# Patient Record
Sex: Female | Born: 1940
Health system: Southern US, Community
[De-identification: ages and names within clinical notes are randomized; demographics above are authoritative.]

## PROBLEM LIST (undated history)

## (undated) DIAGNOSIS — R7989 Other specified abnormal findings of blood chemistry: Secondary | ICD-10-CM

## (undated) DIAGNOSIS — D259 Leiomyoma of uterus, unspecified: Secondary | ICD-10-CM

## (undated) DIAGNOSIS — G4700 Insomnia, unspecified: Secondary | ICD-10-CM

## (undated) DIAGNOSIS — I8390 Asymptomatic varicose veins of unspecified lower extremity: Secondary | ICD-10-CM

## (undated) DIAGNOSIS — E669 Obesity, unspecified: Secondary | ICD-10-CM

## (undated) DIAGNOSIS — R002 Palpitations: Secondary | ICD-10-CM

## (undated) DIAGNOSIS — F411 Generalized anxiety disorder: Secondary | ICD-10-CM

## (undated) DIAGNOSIS — D72819 Decreased white blood cell count, unspecified: Secondary | ICD-10-CM

## (undated) DIAGNOSIS — M4120 Other idiopathic scoliosis, site unspecified: Secondary | ICD-10-CM

## (undated) DIAGNOSIS — K5730 Diverticulosis of large intestine without perforation or abscess without bleeding: Secondary | ICD-10-CM

## (undated) DIAGNOSIS — G709 Myoneural disorder, unspecified: Secondary | ICD-10-CM

## (undated) DIAGNOSIS — T7840XA Allergy, unspecified, initial encounter: Secondary | ICD-10-CM

## (undated) DIAGNOSIS — M545 Low back pain: Secondary | ICD-10-CM

## (undated) DIAGNOSIS — E785 Hyperlipidemia, unspecified: Secondary | ICD-10-CM

## (undated) DIAGNOSIS — M5030 Other cervical disc degeneration, unspecified cervical region: Secondary | ICD-10-CM

## (undated) DIAGNOSIS — K4090 Unilateral inguinal hernia, without obstruction or gangrene, not specified as recurrent: Secondary | ICD-10-CM

## (undated) DIAGNOSIS — H93099 Unspecified degenerative and vascular disorders of unspecified ear: Secondary | ICD-10-CM

## (undated) DIAGNOSIS — R269 Unspecified abnormalities of gait and mobility: Secondary | ICD-10-CM

## (undated) DIAGNOSIS — K219 Gastro-esophageal reflux disease without esophagitis: Secondary | ICD-10-CM

## (undated) DIAGNOSIS — I440 Atrioventricular block, first degree: Secondary | ICD-10-CM

## (undated) DIAGNOSIS — F329 Major depressive disorder, single episode, unspecified: Secondary | ICD-10-CM

## (undated) DIAGNOSIS — R609 Edema, unspecified: Secondary | ICD-10-CM

## (undated) DIAGNOSIS — K279 Peptic ulcer, site unspecified, unspecified as acute or chronic, without hemorrhage or perforation: Secondary | ICD-10-CM

## (undated) DIAGNOSIS — I872 Venous insufficiency (chronic) (peripheral): Secondary | ICD-10-CM

## (undated) DIAGNOSIS — I10 Essential (primary) hypertension: Secondary | ICD-10-CM

## (undated) DIAGNOSIS — M1990 Unspecified osteoarthritis, unspecified site: Secondary | ICD-10-CM

## (undated) DIAGNOSIS — R011 Cardiac murmur, unspecified: Secondary | ICD-10-CM

## (undated) DIAGNOSIS — H269 Unspecified cataract: Secondary | ICD-10-CM

## (undated) DIAGNOSIS — N951 Menopausal and female climacteric states: Secondary | ICD-10-CM

## (undated) HISTORY — DX: Unspecified degenerative and vascular disorders of unspecified ear: H93.099

## (undated) HISTORY — DX: Unspecified abnormalities of gait and mobility: R26.9

## (undated) HISTORY — DX: Hyperlipidemia, unspecified: E78.5

## (undated) HISTORY — DX: Insomnia, unspecified: G47.00

## (undated) HISTORY — DX: Major depressive disorder, single episode, unspecified: F32.9

## (undated) HISTORY — DX: Palpitations: R00.2

## (undated) HISTORY — DX: Unspecified cataract: H26.9

## (undated) HISTORY — DX: Decreased white blood cell count, unspecified: D72.819

## (undated) HISTORY — DX: Atrioventricular block, first degree: I44.0

## (undated) HISTORY — DX: Generalized anxiety disorder: F41.1

## (undated) HISTORY — DX: Leiomyoma of uterus, unspecified: D25.9

## (undated) HISTORY — DX: Obesity, unspecified: E66.9

## (undated) HISTORY — DX: Essential (primary) hypertension: I10

## (undated) HISTORY — DX: Peptic ulcer, site unspecified, unspecified as acute or chronic, without hemorrhage or perforation: K27.9

## (undated) HISTORY — DX: Gastro-esophageal reflux disease without esophagitis: K21.9

## (undated) HISTORY — DX: Menopausal and female climacteric states: N95.1

## (undated) HISTORY — DX: Myoneural disorder, unspecified: G70.9

## (undated) HISTORY — DX: Other specified abnormal findings of blood chemistry: R79.89

## (undated) HISTORY — DX: Other idiopathic scoliosis, site unspecified: M41.20

## (undated) HISTORY — DX: Diverticulosis of large intestine without perforation or abscess without bleeding: K57.30

## (undated) HISTORY — DX: Unilateral inguinal hernia, without obstruction or gangrene, not specified as recurrent: K40.90

## (undated) HISTORY — DX: Edema, unspecified: R60.9

## (undated) HISTORY — DX: Cardiac murmur, unspecified: R01.1

## (undated) HISTORY — DX: Low back pain: M54.5

## (undated) HISTORY — DX: Asymptomatic varicose veins of unspecified lower extremity: I83.90

## (undated) HISTORY — DX: Other cervical disc degeneration, unspecified cervical region: M50.30

## (undated) HISTORY — DX: Venous insufficiency (chronic) (peripheral): I87.2

## (undated) HISTORY — DX: Allergy, unspecified, initial encounter: T78.40XA

## (undated) HISTORY — DX: Unspecified osteoarthritis, unspecified site: M19.90

## (undated) HISTORY — PX: TUBAL LIGATION: SHX77

---

## 1969-02-25 HISTORY — PX: CHOLECYSTECTOMY: SHX55

## 1972-02-26 HISTORY — PX: OOPHORECTOMY: SHX86

## 1973-02-25 HISTORY — PX: VESICOVAGINAL FISTULA CLOSURE W/ TAH: SUR271

## 1973-02-25 HISTORY — PX: TONSILLECTOMY AND ADENOIDECTOMY: SHX28

## 1989-08-25 DIAGNOSIS — K5730 Diverticulosis of large intestine without perforation or abscess without bleeding: Secondary | ICD-10-CM

## 1989-08-25 HISTORY — DX: Diverticulosis of large intestine without perforation or abscess without bleeding: K57.30

## 1990-05-28 DIAGNOSIS — N951 Menopausal and female climacteric states: Secondary | ICD-10-CM

## 1990-05-28 HISTORY — DX: Menopausal and female climacteric states: N95.1

## 1992-03-28 HISTORY — PX: ENDOMETRIAL BIOPSY: SHX622

## 1993-02-25 HISTORY — PX: THUMB ARTHROSCOPY: SHX2509

## 1996-05-26 DIAGNOSIS — E669 Obesity, unspecified: Secondary | ICD-10-CM

## 1996-05-26 HISTORY — DX: Obesity, unspecified: E66.9

## 1997-07-26 HISTORY — PX: KNEE ARTHROSCOPY: SHX127

## 1997-09-12 ENCOUNTER — Ambulatory Visit (HOSPITAL_COMMUNITY): Admission: RE | Admit: 1997-09-12 | Discharge: 1997-09-12 | Payer: Self-pay | Admitting: Obstetrics and Gynecology

## 1998-02-27 ENCOUNTER — Other Ambulatory Visit: Admission: RE | Admit: 1998-02-27 | Discharge: 1998-02-27 | Payer: Self-pay | Admitting: Obstetrics and Gynecology

## 1998-04-20 ENCOUNTER — Encounter: Payer: Self-pay | Admitting: Obstetrics and Gynecology

## 1998-04-20 ENCOUNTER — Ambulatory Visit (HOSPITAL_COMMUNITY): Admission: RE | Admit: 1998-04-20 | Discharge: 1998-04-20 | Payer: Self-pay | Admitting: Obstetrics and Gynecology

## 1999-04-18 ENCOUNTER — Other Ambulatory Visit: Admission: RE | Admit: 1999-04-18 | Discharge: 1999-04-18 | Payer: Self-pay | Admitting: Obstetrics and Gynecology

## 1999-04-25 ENCOUNTER — Encounter: Payer: Self-pay | Admitting: Internal Medicine

## 1999-04-25 ENCOUNTER — Ambulatory Visit (HOSPITAL_COMMUNITY): Admission: RE | Admit: 1999-04-25 | Discharge: 1999-04-25 | Payer: Self-pay | Admitting: Internal Medicine

## 2000-05-30 ENCOUNTER — Encounter: Payer: Self-pay | Admitting: Obstetrics and Gynecology

## 2000-05-30 ENCOUNTER — Ambulatory Visit (HOSPITAL_COMMUNITY): Admission: RE | Admit: 2000-05-30 | Discharge: 2000-05-30 | Payer: Self-pay | Admitting: Obstetrics and Gynecology

## 2000-06-13 ENCOUNTER — Other Ambulatory Visit: Admission: RE | Admit: 2000-06-13 | Discharge: 2000-06-13 | Payer: Self-pay | Admitting: Obstetrics and Gynecology

## 2001-06-09 ENCOUNTER — Ambulatory Visit (HOSPITAL_COMMUNITY): Admission: RE | Admit: 2001-06-09 | Discharge: 2001-06-09 | Payer: Self-pay | Admitting: Obstetrics and Gynecology

## 2001-06-09 ENCOUNTER — Encounter: Payer: Self-pay | Admitting: Obstetrics and Gynecology

## 2001-06-16 ENCOUNTER — Other Ambulatory Visit: Admission: RE | Admit: 2001-06-16 | Discharge: 2001-06-16 | Payer: Self-pay | Admitting: Obstetrics and Gynecology

## 2002-08-23 ENCOUNTER — Ambulatory Visit (HOSPITAL_COMMUNITY): Admission: RE | Admit: 2002-08-23 | Discharge: 2002-08-23 | Payer: Self-pay | Admitting: Internal Medicine

## 2002-08-23 ENCOUNTER — Encounter: Payer: Self-pay | Admitting: Internal Medicine

## 2002-08-25 DIAGNOSIS — H93099 Unspecified degenerative and vascular disorders of unspecified ear: Secondary | ICD-10-CM

## 2002-08-25 HISTORY — DX: Unspecified degenerative and vascular disorders of unspecified ear: H93.099

## 2002-11-10 DIAGNOSIS — F329 Major depressive disorder, single episode, unspecified: Secondary | ICD-10-CM

## 2002-11-10 DIAGNOSIS — K219 Gastro-esophageal reflux disease without esophagitis: Secondary | ICD-10-CM

## 2002-11-10 DIAGNOSIS — F3289 Other specified depressive episodes: Secondary | ICD-10-CM

## 2002-11-10 HISTORY — DX: Other specified depressive episodes: F32.89

## 2002-11-10 HISTORY — DX: Gastro-esophageal reflux disease without esophagitis: K21.9

## 2002-11-10 HISTORY — DX: Major depressive disorder, single episode, unspecified: F32.9

## 2003-04-26 HISTORY — PX: FOOT SURGERY: SHX648

## 2003-10-12 ENCOUNTER — Ambulatory Visit (HOSPITAL_COMMUNITY): Admission: RE | Admit: 2003-10-12 | Discharge: 2003-10-12 | Payer: Self-pay | Admitting: Internal Medicine

## 2004-03-02 ENCOUNTER — Ambulatory Visit (HOSPITAL_BASED_OUTPATIENT_CLINIC_OR_DEPARTMENT_OTHER): Admission: RE | Admit: 2004-03-02 | Discharge: 2004-03-02 | Payer: Self-pay | Admitting: Orthopedic Surgery

## 2004-03-02 ENCOUNTER — Ambulatory Visit (HOSPITAL_COMMUNITY): Admission: RE | Admit: 2004-03-02 | Discharge: 2004-03-02 | Payer: Self-pay | Admitting: Orthopedic Surgery

## 2004-03-02 HISTORY — PX: KNEE SURGERY: SHX244

## 2004-05-26 DIAGNOSIS — M1990 Unspecified osteoarthritis, unspecified site: Secondary | ICD-10-CM

## 2004-05-26 HISTORY — DX: Unspecified osteoarthritis, unspecified site: M19.90

## 2004-10-24 DIAGNOSIS — E785 Hyperlipidemia, unspecified: Secondary | ICD-10-CM

## 2004-10-24 HISTORY — DX: Hyperlipidemia, unspecified: E78.5

## 2004-11-07 ENCOUNTER — Ambulatory Visit (HOSPITAL_COMMUNITY): Admission: RE | Admit: 2004-11-07 | Discharge: 2004-11-07 | Payer: Self-pay | Admitting: Internal Medicine

## 2004-11-13 DIAGNOSIS — I10 Essential (primary) hypertension: Secondary | ICD-10-CM

## 2004-11-13 HISTORY — DX: Essential (primary) hypertension: I10

## 2005-07-11 DIAGNOSIS — R269 Unspecified abnormalities of gait and mobility: Secondary | ICD-10-CM

## 2005-07-11 HISTORY — DX: Unspecified abnormalities of gait and mobility: R26.9

## 2005-11-21 ENCOUNTER — Ambulatory Visit (HOSPITAL_COMMUNITY): Admission: RE | Admit: 2005-11-21 | Discharge: 2005-11-21 | Payer: Self-pay | Admitting: Internal Medicine

## 2006-05-27 DIAGNOSIS — D259 Leiomyoma of uterus, unspecified: Secondary | ICD-10-CM

## 2006-05-27 DIAGNOSIS — K279 Peptic ulcer, site unspecified, unspecified as acute or chronic, without hemorrhage or perforation: Secondary | ICD-10-CM

## 2006-05-27 HISTORY — DX: Peptic ulcer, site unspecified, unspecified as acute or chronic, without hemorrhage or perforation: K27.9

## 2006-05-27 HISTORY — DX: Leiomyoma of uterus, unspecified: D25.9

## 2006-11-25 ENCOUNTER — Ambulatory Visit (HOSPITAL_COMMUNITY): Admission: RE | Admit: 2006-11-25 | Discharge: 2006-11-25 | Payer: Self-pay | Admitting: Internal Medicine

## 2006-11-28 DIAGNOSIS — K4090 Unilateral inguinal hernia, without obstruction or gangrene, not specified as recurrent: Secondary | ICD-10-CM

## 2006-11-28 HISTORY — DX: Unilateral inguinal hernia, without obstruction or gangrene, not specified as recurrent: K40.90

## 2007-03-29 HISTORY — PX: INGUINAL HERNIA REPAIR: SHX194

## 2007-04-03 ENCOUNTER — Ambulatory Visit (HOSPITAL_COMMUNITY): Admission: RE | Admit: 2007-04-03 | Discharge: 2007-04-03 | Payer: Self-pay | Admitting: Surgery

## 2007-07-22 DIAGNOSIS — M545 Low back pain, unspecified: Secondary | ICD-10-CM

## 2007-07-22 HISTORY — DX: Low back pain, unspecified: M54.50

## 2007-08-29 DIAGNOSIS — R002 Palpitations: Secondary | ICD-10-CM

## 2007-08-29 HISTORY — DX: Palpitations: R00.2

## 2007-09-28 ENCOUNTER — Encounter: Admission: RE | Admit: 2007-09-28 | Discharge: 2007-09-28 | Payer: Self-pay | Admitting: Family Medicine

## 2007-11-26 ENCOUNTER — Ambulatory Visit (HOSPITAL_COMMUNITY): Admission: RE | Admit: 2007-11-26 | Discharge: 2007-11-26 | Payer: Self-pay | Admitting: Internal Medicine

## 2007-12-15 DIAGNOSIS — G4700 Insomnia, unspecified: Secondary | ICD-10-CM | POA: Insufficient documentation

## 2007-12-15 HISTORY — DX: Insomnia, unspecified: G47.00

## 2008-07-19 DIAGNOSIS — R7989 Other specified abnormal findings of blood chemistry: Secondary | ICD-10-CM

## 2008-07-19 DIAGNOSIS — I440 Atrioventricular block, first degree: Secondary | ICD-10-CM

## 2008-07-19 HISTORY — DX: Other specified abnormal findings of blood chemistry: R79.89

## 2008-07-19 HISTORY — DX: Atrioventricular block, first degree: I44.0

## 2008-11-28 ENCOUNTER — Ambulatory Visit (HOSPITAL_COMMUNITY): Admission: RE | Admit: 2008-11-28 | Discharge: 2008-11-28 | Payer: Self-pay | Admitting: Internal Medicine

## 2009-05-23 DIAGNOSIS — M4120 Other idiopathic scoliosis, site unspecified: Secondary | ICD-10-CM

## 2009-05-23 HISTORY — DX: Other idiopathic scoliosis, site unspecified: M41.20

## 2009-11-30 ENCOUNTER — Ambulatory Visit (HOSPITAL_COMMUNITY): Admission: RE | Admit: 2009-11-30 | Discharge: 2009-11-30 | Payer: Self-pay | Admitting: Internal Medicine

## 2010-03-19 ENCOUNTER — Encounter: Payer: Self-pay | Admitting: Family Medicine

## 2010-07-10 NOTE — Op Note (Signed)
Felicia Sawyer, DEERMAN.:  [id number]   MEDICAL RECORD NO.:  [id number]          PATIENT TYPE:  AMB   LOCATION:  DAY                          FACILITY:  [facility]   PHYSICIAN:  Felicia Sawyer, M.D. DATE OF BIRTH:  07-16-1940   DATE OF PROCEDURE:  04/03/2007  DATE OF DISCHARGE:                               OPERATIVE REPORT   PREOPERATIVE DIAGNOSIS:  Right inguinal hernia.   POSTOPERATIVE DIAGNOSIS:  Right inguinal hernia.   PROCEDURE PERFORMED:  Right inguinal hernia repair with mesh.   SURGEON:  Felicia Arms. Corliss Skains, MD., FACS   ANESTHESIA:  General endotracheal.   INDICATIONS:  The patient is a 70 year old female, who presents with a  six-month history of an uncomfortable bulge in her right groin.  This  reduces when she is supine.  She denies any obstructive symptoms.  On  examination in the office, she was found to have a reducible right  inguinal hernia.  We recommended elective repair.   DESCRIPTION OF PROCEDURE:  The patient was brought to the operating room  and placed in the supine position on the operating room table.  After an  adequate level of general anesthesia was obtained, the patient's right  groin was shaved, prepped with Betadine, and draped in a sterile  fashion.  A timeout was taken to ensure the proper patient and proper  procedure.  She had been given preoperative antibiotics.  The area above  the right inguinal ligament was then infiltrated with quarter-percent  Marcaine with epinephrine.  An oblique incision was made above the  inguinal ligament.  Dissection was carried down through the subcutaneous  tissues to the external oblique fascia.  The external oblique fascia was  divided along the direction of its fibers down to the external ring.  Weitlaner retractors were used to provide exposure.  A large, direct  hernia was identified.  We dissected the hernia sac free from the  surrounding tissue and reduced it up into the hernia defect.   The floor  of the inguinal canal was then reapproximated with a #0 PDS suture.  There was no sign of an indirect hernia.  The floor of the inguinal  canal was then reinforced with an oval-shaped piece of ULTRAPRO mesh.  This was secured with #2-0 Vicryl beginning at the pubic tubercle.  We  ran the suture along the internal oblique fascia superiorly and the  shelving edge inferiorly.  The lateral end was tucked underneath the  external oblique fascia.  The external oblique fascia was then  reapproximated with #2-0 Vicryl.  #3-0 Vicryl was used to close the  subcutaneous tissues, #4-0 Monocryl sutures for the skin.  Steri-Strips  and dressings were applied.  The patient was extubated and brought to  the recovery room in stable condition.  All sponge and instrument and  needle counts were correct.      Felicia Arms. Tsuei, M.D.  Electronically Signed     MKT/MEDQ  D:  04/03/2007  T:  04/04/2007  Job:  [id number]   cc:   Lenon Curt. Chilton Si, M.D.  Fax: [phone]

## 2010-07-13 NOTE — Op Note (Signed)
[name], Felicia Sawyer NO.:  [id number]   MEDICAL RECORD NO.:  [id number]          PATIENT TYPE:  AMB   LOCATION:  DSC                          FACILITY:  [facility]   PHYSICIAN:  Harvie Junior, M.D.   DATE OF BIRTH:  Jun 28, 1940   DATE OF PROCEDURE:  03/02/2004  DATE OF DISCHARGE:                                 OPERATIVE REPORT   PREOPERATIVE DIAGNOSES:  Medial meniscal tear with tricompartmental  degenerative joint disease.   POSTOPERATIVE DIAGNOSES:  Medial meniscal tear with tricompartmental  degenerative joint disease.   PRINCIPAL PROCEDURE:  1.  Partial posterior horn medial meniscectomy with corresponding      debridement of medial femoral compartment.  2.  Debridement of patellofemoral trochlea from the both the medial and      lateral compartment.   SURGEON:  Harvie Junior, M.D.   ASSISTANT:  None.   ANESTHESIA:  Knee block with MAC.   BRIEF HISTORY:  A 70 year old female with a long history of having had  significant right knee pain. She ultimately was evaluated with MRI which  showed that she had a posterior horn medial meniscal tear because of  continuing complaints of intermittent locking, catching pain and failure of  conservative care. She was taken to the operating room for operative knee  arthroscopy.   DESCRIPTION OF PROCEDURE:  The patient was taken to the operating room and  after adequate anesthesia was obtained with general anesthetic, the patient  was placed on the operating table, the right knee was prepped and draped in  the usual sterile fashion. Following this, routine arthroscopic examination  of the knee revealed there was an obvious area of cartilage injury in the  trochlea. This was debrided from both the medial and lateral portal.  Following this, attention turned to the medial compartment where there was  noted to be grade 2 and 3 change on the medial femoral condyle as well as a  posterior horn meniscal tear. This was  debrided to a smooth and stable rim  with straight biting forceps. A probe was used to make sure the remaining  meniscus was stable. Attention turned to the ACL normal, attention turned to  the lateral compartment which was within normal limits.  At this point, the  knee was copiously irrigated with normal saline irrigation and suctioned  drop. The arthroscopic portals were closed with a bandage, a sterile  compressive dressing was applied, the patient taken to the recovery room  where she was noted to be in satisfactory condition. Estimated blood loss  for the procedure was none.      JLG/MEDQ  D:  03/02/2004  T:  03/02/2004  Job:  [id number]

## 2010-08-28 DIAGNOSIS — D72819 Decreased white blood cell count, unspecified: Secondary | ICD-10-CM

## 2010-08-28 HISTORY — DX: Decreased white blood cell count, unspecified: D72.819

## 2010-11-16 LAB — DIFFERENTIAL
Basophils Absolute: 0
Basophils Relative: 1
Eosinophils Absolute: 0.2
Eosinophils Relative: 5
Lymphocytes Relative: 45
Lymphs Abs: 2
Monocytes Absolute: 0.4
Monocytes Relative: 10
Neutro Abs: 1.7
Neutrophils Relative %: 39 — ABNORMAL LOW

## 2010-11-16 LAB — CBC
HCT: 36.9
Hemoglobin: 12.4
MCHC: 33.5
MCV: 92
Platelets: 213
RBC: 4.01
RDW: 12.4
WBC: 4.3

## 2010-11-16 LAB — BASIC METABOLIC PANEL
BUN: 8
CO2: 29
Calcium: 9.1
Chloride: 98
Creatinine, Ser: 0.71
GFR calc Af Amer: >60
GFR calc non Af Amer: >60
Glucose, Bld: 95
Potassium: 3.9
Sodium: 135

## 2010-12-10 ENCOUNTER — Other Ambulatory Visit: Payer: Self-pay | Admitting: Internal Medicine

## 2010-12-10 DIAGNOSIS — Z1231 Encounter for screening mammogram for malignant neoplasm of breast: Secondary | ICD-10-CM

## 2011-01-02 ENCOUNTER — Ambulatory Visit (HOSPITAL_COMMUNITY)
Admission: RE | Admit: 2011-01-02 | Discharge: 2011-01-02 | Disposition: A | Discharge status: Home / Self Care | Payer: Medicare Other | Source: Ambulatory Visit | Attending: Internal Medicine | Admitting: Internal Medicine

## 2011-01-02 DIAGNOSIS — Z1231 Encounter for screening mammogram for malignant neoplasm of breast: Secondary | ICD-10-CM | POA: Insufficient documentation

## 2011-03-04 DIAGNOSIS — R7989 Other specified abnormal findings of blood chemistry: Secondary | ICD-10-CM | POA: Diagnosis not present

## 2011-03-04 DIAGNOSIS — I10 Essential (primary) hypertension: Secondary | ICD-10-CM | POA: Diagnosis not present

## 2011-03-04 DIAGNOSIS — E785 Hyperlipidemia, unspecified: Secondary | ICD-10-CM | POA: Diagnosis not present

## 2011-03-06 ENCOUNTER — Other Ambulatory Visit: Payer: Self-pay | Admitting: Internal Medicine

## 2011-03-06 DIAGNOSIS — R7989 Other specified abnormal findings of blood chemistry: Secondary | ICD-10-CM | POA: Diagnosis not present

## 2011-03-06 DIAGNOSIS — Z23 Encounter for immunization: Secondary | ICD-10-CM | POA: Diagnosis not present

## 2011-03-06 DIAGNOSIS — Z780 Asymptomatic menopausal state: Secondary | ICD-10-CM

## 2011-03-06 DIAGNOSIS — I10 Essential (primary) hypertension: Secondary | ICD-10-CM | POA: Diagnosis not present

## 2011-03-06 DIAGNOSIS — E785 Hyperlipidemia, unspecified: Secondary | ICD-10-CM | POA: Diagnosis not present

## 2011-03-12 ENCOUNTER — Ambulatory Visit (HOSPITAL_COMMUNITY): Payer: Medicare Other | Attending: Internal Medicine

## 2011-03-12 DIAGNOSIS — M5030 Other cervical disc degeneration, unspecified cervical region: Secondary | ICD-10-CM | POA: Diagnosis not present

## 2011-03-12 DIAGNOSIS — M9981 Other biomechanical lesions of cervical region: Secondary | ICD-10-CM | POA: Diagnosis not present

## 2011-03-12 DIAGNOSIS — M999 Biomechanical lesion, unspecified: Secondary | ICD-10-CM | POA: Diagnosis not present

## 2011-03-12 DIAGNOSIS — M5137 Other intervertebral disc degeneration, lumbosacral region: Secondary | ICD-10-CM | POA: Diagnosis not present

## 2011-06-12 DIAGNOSIS — H359 Unspecified retinal disorder: Secondary | ICD-10-CM | POA: Diagnosis not present

## 2011-06-12 DIAGNOSIS — H2510 Age-related nuclear cataract, unspecified eye: Secondary | ICD-10-CM | POA: Diagnosis not present

## 2011-06-12 DIAGNOSIS — H524 Presbyopia: Secondary | ICD-10-CM | POA: Diagnosis not present

## 2011-09-20 DIAGNOSIS — D72819 Decreased white blood cell count, unspecified: Secondary | ICD-10-CM | POA: Diagnosis not present

## 2011-09-20 DIAGNOSIS — I10 Essential (primary) hypertension: Secondary | ICD-10-CM | POA: Diagnosis not present

## 2011-09-20 DIAGNOSIS — E785 Hyperlipidemia, unspecified: Secondary | ICD-10-CM | POA: Diagnosis not present

## 2011-09-24 DIAGNOSIS — E785 Hyperlipidemia, unspecified: Secondary | ICD-10-CM | POA: Diagnosis not present

## 2011-09-24 DIAGNOSIS — I10 Essential (primary) hypertension: Secondary | ICD-10-CM | POA: Diagnosis not present

## 2011-09-24 DIAGNOSIS — R7989 Other specified abnormal findings of blood chemistry: Secondary | ICD-10-CM | POA: Diagnosis not present

## 2011-09-24 DIAGNOSIS — R609 Edema, unspecified: Secondary | ICD-10-CM

## 2011-09-24 DIAGNOSIS — I8390 Asymptomatic varicose veins of unspecified lower extremity: Secondary | ICD-10-CM

## 2011-09-24 HISTORY — DX: Asymptomatic varicose veins of unspecified lower extremity: I83.90

## 2011-09-24 HISTORY — DX: Edema, unspecified: R60.9

## 2011-12-31 DIAGNOSIS — Z23 Encounter for immunization: Secondary | ICD-10-CM | POA: Diagnosis not present

## 2012-01-14 ENCOUNTER — Other Ambulatory Visit: Payer: Self-pay | Admitting: Internal Medicine

## 2012-01-14 DIAGNOSIS — Z1231 Encounter for screening mammogram for malignant neoplasm of breast: Secondary | ICD-10-CM

## 2012-02-05 ENCOUNTER — Ambulatory Visit (HOSPITAL_COMMUNITY)
Admission: RE | Admit: 2012-02-05 | Discharge: 2012-02-05 | Disposition: A | Discharge status: Home / Self Care | Payer: Medicare Other | Source: Ambulatory Visit | Attending: Internal Medicine | Admitting: Internal Medicine

## 2012-02-05 DIAGNOSIS — Z1231 Encounter for screening mammogram for malignant neoplasm of breast: Secondary | ICD-10-CM | POA: Diagnosis not present

## 2012-02-12 ENCOUNTER — Other Ambulatory Visit: Payer: Self-pay | Admitting: Internal Medicine

## 2012-02-12 DIAGNOSIS — R928 Other abnormal and inconclusive findings on diagnostic imaging of breast: Secondary | ICD-10-CM

## 2012-02-14 ENCOUNTER — Ambulatory Visit
Admission: RE | Admit: 2012-02-14 | Discharge: 2012-02-14 | Disposition: A | Discharge status: Home / Self Care | Payer: Medicare Other | Source: Ambulatory Visit | Attending: Internal Medicine | Admitting: Internal Medicine

## 2012-02-14 DIAGNOSIS — N6009 Solitary cyst of unspecified breast: Secondary | ICD-10-CM | POA: Diagnosis not present

## 2012-02-14 DIAGNOSIS — R928 Other abnormal and inconclusive findings on diagnostic imaging of breast: Secondary | ICD-10-CM

## 2012-03-23 DIAGNOSIS — R7989 Other specified abnormal findings of blood chemistry: Secondary | ICD-10-CM | POA: Diagnosis not present

## 2012-03-23 DIAGNOSIS — Z79899 Other long term (current) drug therapy: Secondary | ICD-10-CM | POA: Diagnosis not present

## 2012-03-23 DIAGNOSIS — E785 Hyperlipidemia, unspecified: Secondary | ICD-10-CM | POA: Diagnosis not present

## 2012-03-23 DIAGNOSIS — I10 Essential (primary) hypertension: Secondary | ICD-10-CM | POA: Diagnosis not present

## 2012-04-08 DIAGNOSIS — F411 Generalized anxiety disorder: Secondary | ICD-10-CM | POA: Insufficient documentation

## 2012-04-08 DIAGNOSIS — I10 Essential (primary) hypertension: Secondary | ICD-10-CM | POA: Diagnosis not present

## 2012-04-08 HISTORY — DX: Generalized anxiety disorder: F41.1

## 2012-06-13 ENCOUNTER — Other Ambulatory Visit (HOSPITAL_COMMUNITY): Payer: Self-pay | Admitting: Internal Medicine

## 2012-07-14 ENCOUNTER — Other Ambulatory Visit: Payer: Self-pay | Admitting: *Deleted

## 2012-07-14 MED ORDER — CLORAZEPATE DIPOTASSIUM 7.5 MG PO TABS: ORAL_TABLET | ORAL | Status: DC

## 2012-07-15 ENCOUNTER — Other Ambulatory Visit: Payer: Self-pay | Admitting: *Deleted

## 2012-07-15 ENCOUNTER — Other Ambulatory Visit: Payer: Self-pay | Admitting: Internal Medicine

## 2012-07-15 MED ORDER — CLORAZEPATE DIPOTASSIUM 7.5 MG PO TABS: ORAL_TABLET | ORAL | Status: DC

## 2012-07-16 ENCOUNTER — Other Ambulatory Visit: Payer: Self-pay | Admitting: *Deleted

## 2012-07-16 MED ORDER — CLORAZEPATE DIPOTASSIUM 7.5 MG PO TABS: ORAL_TABLET | ORAL | Status: DC

## 2012-09-02 DIAGNOSIS — IMO0002 Reserved for concepts with insufficient information to code with codable children: Secondary | ICD-10-CM | POA: Diagnosis not present

## 2012-09-02 DIAGNOSIS — M5137 Other intervertebral disc degeneration, lumbosacral region: Secondary | ICD-10-CM | POA: Diagnosis not present

## 2012-09-02 DIAGNOSIS — M5030 Other cervical disc degeneration, unspecified cervical region: Secondary | ICD-10-CM | POA: Diagnosis not present

## 2012-09-02 DIAGNOSIS — M999 Biomechanical lesion, unspecified: Secondary | ICD-10-CM | POA: Diagnosis not present

## 2012-09-02 DIAGNOSIS — M9981 Other biomechanical lesions of cervical region: Secondary | ICD-10-CM | POA: Diagnosis not present

## 2012-09-14 ENCOUNTER — Other Ambulatory Visit: Payer: Medicare Other

## 2012-09-14 DIAGNOSIS — Z0000 Encounter for general adult medical examination without abnormal findings: Secondary | ICD-10-CM | POA: Diagnosis not present

## 2012-09-14 DIAGNOSIS — Z79899 Other long term (current) drug therapy: Secondary | ICD-10-CM | POA: Diagnosis not present

## 2012-09-14 DIAGNOSIS — E782 Mixed hyperlipidemia: Secondary | ICD-10-CM | POA: Diagnosis not present

## 2012-09-14 DIAGNOSIS — E785 Hyperlipidemia, unspecified: Secondary | ICD-10-CM | POA: Diagnosis not present

## 2012-09-14 DIAGNOSIS — I10 Essential (primary) hypertension: Secondary | ICD-10-CM | POA: Diagnosis not present

## 2012-09-16 ENCOUNTER — Encounter: Payer: Self-pay | Admitting: *Deleted

## 2012-09-16 ENCOUNTER — Ambulatory Visit (INDEPENDENT_AMBULATORY_CARE_PROVIDER_SITE_OTHER): Payer: Medicare Other | Admitting: Internal Medicine

## 2012-09-16 ENCOUNTER — Encounter: Payer: Self-pay | Admitting: Internal Medicine

## 2012-09-16 VITALS — BP 150/74 | HR 101 | Temp 97.0°F | Resp 20 | Wt 141.2 lb

## 2012-09-16 DIAGNOSIS — F411 Generalized anxiety disorder: Secondary | ICD-10-CM

## 2012-09-16 DIAGNOSIS — M545 Low back pain, unspecified: Secondary | ICD-10-CM

## 2012-09-16 DIAGNOSIS — I872 Venous insufficiency (chronic) (peripheral): Secondary | ICD-10-CM

## 2012-09-16 DIAGNOSIS — R609 Edema, unspecified: Secondary | ICD-10-CM

## 2012-09-16 DIAGNOSIS — I10 Essential (primary) hypertension: Secondary | ICD-10-CM

## 2012-09-16 DIAGNOSIS — G4700 Insomnia, unspecified: Secondary | ICD-10-CM | POA: Diagnosis not present

## 2012-09-16 DIAGNOSIS — I739 Peripheral vascular disease, unspecified: Secondary | ICD-10-CM | POA: Insufficient documentation

## 2012-09-16 DIAGNOSIS — E785 Hyperlipidemia, unspecified: Secondary | ICD-10-CM

## 2012-09-16 HISTORY — DX: Venous insufficiency (chronic) (peripheral): I87.2

## 2012-09-16 MED ORDER — FUROSEMIDE 40 MG PO TABS: 40.0000 mg | ORAL_TABLET | Freq: Every day | ORAL | Status: DC

## 2012-09-16 NOTE — Patient Instructions (Signed)
Switch HCTZ to furosemide.

## 2012-09-16 NOTE — Progress Notes (Signed)
MRN: [id number] Name: Felicia Sawyer  Sex: female Age: 72 y.o. DOB: August 07, 1940  [facility] #: [id number] Level Of Care: independent Provider: Kimber Relic Emergency Contacts: Extended Emergency Contact Information Primary Emergency Contact: Wade,Janice Address: [address]          [place], [place] Mobile Phone: [phone] Relation: None  Code Status: Full  Allergies: Demerol; Venlafaxine hcl; and Pseudoephedrine  Chief Complaint  Patient presents with  . Annual Exam    HPI: Patient is 73 y.o. female who presents for complete exam and evaluation of her medical problems.  Husband's sister died recently.  Concerned about the edema in the legs. No pain. Veins are looking worse. Using HCTZ 50 mg daily, but it does not help the edema.  Anxiety state, unspecified: Stable.  Edema : Currently on HCTZ for her blood pressure. It does not seem to impact the edema. She is on no other medications that should cause edema. She has known varicosities. No known history of DVT.  Insomnia, unspecified: Doing better with nocturnal rest.  Unspecified essential hypertension: Controlled  Other and unspecified hyperlipidemia: Controlled  Lumbago: Stable and improved at this time.  Chronic venous insufficiency: Chronic and likely related to the edema.    Past Medical History  Diagnosis Date  . Anxiety state, unspecified 04/08/2012  . Asymptomatic varicose veins 09/24/2011  . Edema 09/24/2011  . Leukocytopenia, unspecified 08/28/2010  . Scoliosis (and kyphoscoliosis), idiopathic 05/23/2009  . First degree atrioventricular block 07/19/2008  . Other abnormal blood chemistry 07/19/2008  . Insomnia, unspecified 12/15/2007  . Palpitations 08/29/2007  . Lumbago 07/22/2007  . Inguinal hernia without mention of obstruction or gangrene, unilateral or unspecified, (not specified as recurrent) 11/28/2006  . Leiomyoma of uterus, unspecified 05/27/2006  . Peptic  ulcer, unspecified site, unspecified as acute or chronic, without mention of hemorrhage, perforation, or obstruction 05/27/2006  . Abnormality of gait 07/11/2005  . Unspecified essential hypertension 11/13/2004  . Other and unspecified hyperlipidemia 10/24/2004  . Osteoarthrosis, unspecified whether generalized or localized, unspecified site 05/26/2004  . Depressive disorder, not elsewhere classified 11/10/2002  . Esophageal reflux 11/10/2002  . Degenerative and vascular disorders of ear, unspecified 08/25/2002  . Obesity, unspecified 05/26/1996  . Symptomatic menopausal or female climacteric states 05/28/1990  . Diverticulosis of colon (without mention of hemorrhage) 08/25/1989    Past Surgical History  Procedure Laterality Date  . Cholecystectomy  1971  . Tonsillectomy and adenoidectomy  1975  . Oophorectomy Right 1974    Dr. Logan Bores  . Endometrial biopsy  03/1992    Dr. Dewaine Conger  . Knee arthroscopy Left 07/1997    Dr. Meade Maw  . Thumb arthroscopy Right 1995    Dr.Sypher  . Foot surgery Left 04/2003    [facility]- Dr. Raynald Kemp  . Knee surgery Right 03/02/2004    Dr. Luiz Blare  . Inguinal hernia repair Right 03/2007    Dr. Corliss Skains      Medication List       This list is accurate as of: 09/16/12  3:36 PM.  Always use your most recent med list.               aspirin 81 MG tablet  Take 81 mg by mouth daily. Take 1 tablet once daily.     cloNIDine 0.1 MG tablet  Commonly known as:  CATAPRES  TAKE 1 TABLET TWICE A DAY TO CONTROL BLOOD PRESSURE     clorazepate 7.5 MG tablet  Commonly known as:  TRANXENE  Take one tablet twice a day for  anxiety and rest     fish oil-omega-3 fatty acids 1000 MG capsule  Take 300 mg by mouth daily. Take 1 tablet daily.     GARLIC PO  Take by mouth. Take 1 tablet once daily.     Grape Seed Extract 100 MG Caps  Take 100 mg by mouth daily. Take 1 capsule once daily     HM FEXOFENADINE HCL 180 MG tablet  Generic drug:  fexofenadine  Take  180 mg by mouth daily. Take 1 tablet daily as needed for allergies.     hydrochlorothiazide 50 MG tablet  Commonly known as:  HYDRODIURIL  Take 50 mg by mouth daily. Take 1 tablet once daily to control blood pressure.     hyoscyamine 0.375 MG 12 hr tablet  Commonly known as:  LEVBID  Take 0.375 mg by mouth every 12 (twelve) hours as needed for cramping. Take 1 capsule once daily to reduce stomach cramps as needed.     mirtazapine 30 MG tablet  Commonly known as:  REMERON  Take 30 mg by mouth at bedtime. Take 1 tablet nightly to help nerves and to rest.     simvastatin 40 MG tablet  Commonly known as:  ZOCOR  Take 40 mg by mouth every evening. Take 1 tablet once daily to control cholesterol.     vitamin E 400 UNIT capsule  Take 400 Units by mouth daily.       PROCEDURES 1972- Cystoscopy 1976- IVP/BaE 7/91- Flex sigmoid - diverticulosis - Dr Randa Evens 2/02- Mammogram - negative 3/02- Flex sigmoid- normal - Dr Randa Evens 6/02- Bone Density- normal 08/23/02- Mammogram- negative 10/12/03- mammogram- normal 11/07/04- Mammogram- negative 11/14/04- DEXA-normal 11/21/05- Mammogram- negative 11/26/2007- Mammogram: Normal  11/28/2008 Mammogram: Normal  05/10/2009 X-Ray of the Lumbar Spine: Scoliosis and Spondylosis  11/30/2009 Mammogram negative  CONSULTANTS Gen Surg: Corliss Skains Urol: Logan Bores GYN: Arty Baumgartner: Sypher Ortho: Presson Podiatry:Sheard OrthoLuiz Blare GI: Edwards Chiropractor: Williams  Immunization History  Administered Date(s) Administered  . DTaP 03/06/2011  . Influenza-Generic 11/29/2009  . Pneumococcal Conjugate 04/16/2002  . Td 01/16/1993    History  Substance Use Topics  . Smoking status: Not on file  . Smokeless tobacco: Not on file  . Alcohol Use: Not on file   SOCIAL HISTORY MARITAL HISTORY:   Married. 1965. Widowed March 2013. Husband had Alzheimer's since 2009. HOUSING: The patient lives in a single level home. PERSONS IN HOME:  Spouse. LIVING WILL:  The  patient does not have a living will. OCCUPATION:    Retired  TOBACCO USE:   Currently smokes 1/2 PPD, has smoked for 25 to 30 years. ALCOHOL:   Does not give any significant history of alcohol usage. CAFFEINE:  Does not use caffeinated beverages. EXERCISES:   The exercise is predominantly walking.5 days week DIET:  Follows no specific diet. STRESS ISSUES:    FAMILY HISTORY FATHER:    The father is deceased.  The cause of death was myocardial infarction, complications of diabetes. MOTHER:   Illnesses:  Hypertension.  The cause of death was kidney failure. SIBLINGS:   1)  The patient's sister is deceased. The cause of death was. blood clot 2)  The patient's sister is deceased.  The cause of death was accidental. fire 3)  The patient's sister is deceased.  The cause of death was accidental. 4)  The patient's sister is living.  Illnesses:  Hypertension.Ruby CHILDREN:   1)  The patient's daughter is living.Desiree  2)  The patient's daughter is living.Rande Brunt  3)  The patient's son is deceased. The cause of death was.Death occurred in the 32's.   Review of Systems  DATA OBTAINED: from patient, medical record GENERAL: Feels well no fevers, fatigue, appetite changes SKIN: No itching, rash or wounds EYES: No eye pain, redness, discharge. History cataracts. EARS: No earache, tinnitus, change in hearing NOSE: No congestion, drainage or bleeding  MOUTH/THROAT: No mouth or tooth pain, No sore throat, No difficulty chewing or swallowing  RESPIRATORY: No cough, wheezing, SOB CARDIAC: No chest pain, palpitations. Persistent lower extremity edema. He has noticed some enlarged veins as well. GI: No abdominal pain, No N/V/D or constipation, No heartburn or reflux  GU: No dysuria, frequency or urgency, or incontinence  MUSCULOSKELETAL: Chronic neck discomfort. No specific joint pains.  NEUROLOGIC: Awake, alert, appropriate to situation, No change in mental status. Moves all four, no focal  deficits. Normal balance. PSYCHIATRIC: Chronic anxiety. History of depression. Sleeps well. No behavior issue.  AMBULATION:  Normal  Filed Vitals:   09/16/12 1526  BP: 150/74  Pulse: 101  Temp: 97 F (36.1 C)  Resp: 20    Physical Exam BP 150/74  Pulse 101  Temp(Src) 97 F (36.1 C) (Oral)  Resp 20  Wt 141 lb 3.2 oz (64.048 kg)  SpO2 95% Looks like it could be anything GENERAL APPEARANCE: Alert, conversant. Appropriately groomed. No acute distress.  SKIN: No diaphoresis rash, or wounds HEAD: Normocephalic, atraumatic  EYES: Conjunctiva/lids clear. Pupils round, reactive. EOMs intact.  EARS: External exam WNL, canals clear. Hearing grossly normal.  NOSE: No deformity or discharge.  MOUTH/THROAT: Lips w/o lesions. Mouth and throat normal. Tongue moist, w/o lesion.  NECK: No thyroid tenderness, enlargement or nodule  RESPIRATORY: Breathing is even, unlabored. Lung sounds are clear   CARDIOVASCULAR: Heart RRR no murmurs, rubs or gallops. No peripheral edema.  ARTERIAL: radial pulse 2+, DP pulse 1+  VENOUS: Bilateral varicosities, both superficial and deeper. 1+ bipedal edema extending halfway up his leg. She is wearing compression stockings.  GASTROINTESTINAL: Abdomen is soft, non-tender, not distended w/ normal bowel sounds. No mass, ventral or inguinal hernia. No organomegaly GENITOURINARY: Bladder non tender, not distended  MUSCULOSKELETAL: Palpable crepitus in both shoulders and knees. NEUROLOGIC: Oriented X3. Cranial nerves 2-12 grossly intact. Moves all extremities no tremor. Normal vibratory sensation in both the PSYCHIATRIC: Mood and affect appropriate to situation, no behavioral issues  Patient Active Problem List   Diagnosis Date Noted  . Chronic venous insufficiency 09/16/2012  . Anxiety state, unspecified 04/08/2012  . Edema 09/24/2011  . Insomnia, unspecified 12/15/2007  . Lumbago 07/22/2007  . Unspecified essential hypertension 11/13/2004  . Other and  unspecified hyperlipidemia 10/24/2004    Functional assessment: Independent in all ADL  LAB REVIEW 09/14/2012 CMP: Normal  Lipids: TC 128, triglyceride 43, HDL 66, LDL 53    Assessment and Plan  Anxiety state, unspecified: Overall, she is comfortable and doing better.  Edema: I believe related to chronic venous insufficiency. Patient was provided literature about this. She is willing to try a stronger diuretic for a while.   - Plan: furosemide (LASIX) 40 MG tablet; discontinue HCTZ  Insomnia, unspecified: Sleeping better  Unspecified essential hypertension: Controlled. Will need followup of basic metabolic panel since we are switching HCTZ to furosemide   - Plan: Basic metabolic panel  Other and unspecified hyperlipidemia: Under good control  Lumbago: Improved  Chronic venous insufficiency: Related to chronic edema. Patient is to wear compression stockings  and we have switched HCTZ to furosemide 40 mg daily.    GREEN, Lenon Curt, MD

## 2012-09-28 ENCOUNTER — Encounter: Payer: Self-pay | Admitting: Internal Medicine

## 2012-10-14 ENCOUNTER — Other Ambulatory Visit: Payer: Self-pay | Admitting: Geriatric Medicine

## 2012-10-14 DIAGNOSIS — R609 Edema, unspecified: Secondary | ICD-10-CM

## 2012-10-14 MED ORDER — FUROSEMIDE 40 MG PO TABS: 40.0000 mg | ORAL_TABLET | Freq: Every day | ORAL | Status: DC

## 2012-12-02 DIAGNOSIS — Z23 Encounter for immunization: Secondary | ICD-10-CM | POA: Diagnosis not present

## 2013-01-11 ENCOUNTER — Other Ambulatory Visit: Payer: Medicare Other

## 2013-01-11 ENCOUNTER — Other Ambulatory Visit: Payer: Self-pay | Admitting: Internal Medicine

## 2013-01-11 DIAGNOSIS — I10 Essential (primary) hypertension: Secondary | ICD-10-CM

## 2013-01-11 DIAGNOSIS — Z1231 Encounter for screening mammogram for malignant neoplasm of breast: Secondary | ICD-10-CM

## 2013-01-12 ENCOUNTER — Encounter: Payer: Self-pay | Admitting: *Deleted

## 2013-01-12 LAB — BASIC METABOLIC PANEL
BUN/Creatinine Ratio: 16 (ref 11–26)
BUN: 12 mg/dL (ref 8–27)
CO2: 25 mmol/L (ref 18–29)
Calcium: 8.9 mg/dL (ref 8.6–10.2)
Chloride: 97 mmol/L (ref 97–108)
Creatinine, Ser: 0.76 mg/dL (ref 0.57–1.00)
GFR calc Af Amer: 92 mL/min/{1.73_m2} (ref >59)
GFR calc non Af Amer: 80 mL/min/{1.73_m2} (ref >59)
Glucose: 87 mg/dL (ref 65–99)
Potassium: 4.1 mmol/L (ref 3.5–5.2)
Sodium: 139 mmol/L (ref 134–144)

## 2013-01-13 ENCOUNTER — Ambulatory Visit (INDEPENDENT_AMBULATORY_CARE_PROVIDER_SITE_OTHER): Payer: Medicare Other | Admitting: Internal Medicine

## 2013-01-13 ENCOUNTER — Encounter: Payer: Self-pay | Admitting: Internal Medicine

## 2013-01-13 VITALS — BP 154/98 | HR 67 | Temp 97.7°F | Resp 14 | Ht 61.5 in | Wt 144.2 lb

## 2013-01-13 DIAGNOSIS — R609 Edema, unspecified: Secondary | ICD-10-CM

## 2013-01-13 DIAGNOSIS — F329 Major depressive disorder, single episode, unspecified: Secondary | ICD-10-CM | POA: Diagnosis not present

## 2013-01-13 DIAGNOSIS — I10 Essential (primary) hypertension: Secondary | ICD-10-CM

## 2013-01-13 DIAGNOSIS — E785 Hyperlipidemia, unspecified: Secondary | ICD-10-CM

## 2013-01-13 MED ORDER — CITALOPRAM HYDROBROMIDE 20 MG PO TABS: 20.0000 mg | ORAL_TABLET | Freq: Every day | ORAL | Status: DC

## 2013-01-13 MED ORDER — LOSARTAN POTASSIUM 50 MG PO TABS: ORAL_TABLET | ORAL | Status: DC

## 2013-01-13 NOTE — Progress Notes (Signed)
Subjective:    Patient ID: Felicia Sawyer, female    DOB: February 07, 1941, 72 y.o.   MRN: [id number]   Chief Complaint  Patient presents with  . Medical Managment of Chronic Issues    HPI Unspecified essential hypertension: elevated recently  Other and unspecified hyperlipidemia: continue to check iin the future  Edema: improved on compression stockings  Depression, major: tearful. Restless. Started after her husband's death    Appointment on 01/12/2013  Component Date Value Range Status  . Glucose January 12, 2013 87  65 - 99 mg/dL Final  . BUN 14/78/2956 12  8 - 27 mg/dL Final  . Creatinine, Ser 2013/01/12 0.76  0.57 - 1.00 mg/dL Final  . GFR calc non Af Amer Jan 12, 2013 80  >59 mL/min/1.73 Final  . GFR calc Af Amer 2013/01/12 92  >59 mL/min/1.73 Final  . BUN/Creatinine Ratio Jan 12, 2013 16  11 - 26 Final  . Sodium 01-12-13 139  134 - 144 mmol/L Final  . Potassium 01-12-2013 4.1  3.5 - 5.2 mmol/L Final  . Chloride 12-Jan-2013 97  97 - 108 mmol/L Final  . CO2 Jan 12, 2013 25  18 - 29 mmol/L Final  . Calcium Jan 12, 2013 8.9  8.6 - 10.2 mg/dL Final    Review of Systems  Constitutional: Negative for fever, chills, diaphoresis, activity change, appetite change and fatigue.  HENT: Negative.   Eyes: Positive for visual disturbance (corrective lenses).  Cardiovascular: Negative for chest pain, palpitations and leg swelling.  Gastrointestinal: Negative for nausea, abdominal pain, diarrhea, constipation and abdominal distention.  Genitourinary: Negative.   Musculoskeletal: Positive for neck pain.  Allergic/Immunologic: Negative.   Psychiatric/Behavioral: The patient is nervous/anxious (ever since her husband's death).        Feels depressed and tearful. Does not sleep well. Early AM awakening.       Objective:BP 154/98  Pulse 67  Temp(Src) 97.7 F (36.5 C) (Oral)  Resp 14  Ht 5' 1.5" (1.562 m)  Wt 144 lb 3.2 oz (65.409 kg)  BMI 26.81 kg/m2    Physical Exam  Constitutional:  She is oriented to person, place, and time. She appears well-developed and well-nourished. No distress.  HENT:  Head: Normocephalic and atraumatic.  Right Ear: External ear normal.  Left Ear: External ear normal.  Nose: Nose normal.  Mouth/Throat: Oropharynx is clear and moist.  Eyes: Conjunctivae and EOM are normal. Pupils are equal, round, and reactive to light.  Neck: No JVD present. No tracheal deviation present. No thyromegaly present.  Cardiovascular: Normal rate, regular rhythm, normal heart sounds and intact distal pulses.  Exam reveals no gallop and no friction rub.   No murmur heard. Pulmonary/Chest: No respiratory distress. She has no wheezes. She has no rales.  Abdominal: She exhibits no distension and no mass. There is no tenderness.  Musculoskeletal: She exhibits no edema and no tenderness.  Lymphadenopathy:    She has no cervical adenopathy.  Neurological: She is alert and oriented to person, place, and time. She has normal reflexes. No cranial nerve deficit. Coordination normal.  Skin: No rash noted. No erythema. No pallor.  Psychiatric: Her behavior is normal. Judgment and thought content normal.  Sad when describing her current life.    Appointment on 2013-01-12  Component Date Value Range Status  . Glucose 12-Jan-2013 87  65 - 99 mg/dL Final  . BUN 21/30/8657 12  8 - 27 mg/dL Final  . Creatinine, Ser 12-Jan-2013 0.76  0.57 - 1.00 mg/dL Final  . GFR calc non Af Amer 2013-01-12 80  >  59 mL/min/1.73 Final  . GFR calc Af Amer 01/11/2013 92  >59 mL/min/1.73 Final  . BUN/Creatinine Ratio 01/11/2013 16  11 - 26 Final  . Sodium 01/11/2013 139  134 - 144 mmol/L Final  . Potassium 01/11/2013 4.1  3.5 - 5.2 mmol/L Final  . Chloride 01/11/2013 97  97 - 108 mmol/L Final  . CO2 01/11/2013 25  18 - 29 mmol/L Final  . Calcium 01/11/2013 8.9  8.6 - 10.2 mg/dL Final         Assessment & Plan:  1. Unspecified essential hypertension Add medication - losartan (COZAAR) 50 MG  tablet; One daily to control BP  Dispense: 90 tablet; Refill: 3  2. Other and unspecified hyperlipidemia Follow at future visit  3. Edema Improved  4. Depression, major Add medication - citalopram (CELEXA) 20 MG tablet; Take 1 tablet (20 mg total) by mouth daily.  Dispense: 90 tablet; Refill: 3

## 2013-01-13 NOTE — Patient Instructions (Signed)
Start losartan for BP. Start citalopram for the depression. Continue current medications.

## 2013-02-03 ENCOUNTER — Encounter: Payer: Self-pay | Admitting: Internal Medicine

## 2013-02-03 ENCOUNTER — Ambulatory Visit (INDEPENDENT_AMBULATORY_CARE_PROVIDER_SITE_OTHER): Payer: Medicare Other | Admitting: Internal Medicine

## 2013-02-03 VITALS — BP 166/84 | HR 72 | Temp 98.7°F | Wt 145.0 lb

## 2013-02-03 DIAGNOSIS — F411 Generalized anxiety disorder: Secondary | ICD-10-CM

## 2013-02-03 DIAGNOSIS — E785 Hyperlipidemia, unspecified: Secondary | ICD-10-CM | POA: Diagnosis not present

## 2013-02-03 DIAGNOSIS — R609 Edema, unspecified: Secondary | ICD-10-CM

## 2013-02-03 DIAGNOSIS — F329 Major depressive disorder, single episode, unspecified: Secondary | ICD-10-CM | POA: Diagnosis not present

## 2013-02-03 DIAGNOSIS — I10 Essential (primary) hypertension: Secondary | ICD-10-CM

## 2013-02-03 DIAGNOSIS — M542 Cervicalgia: Secondary | ICD-10-CM | POA: Insufficient documentation

## 2013-02-03 MED ORDER — METAXALONE 800 MG PO TABS: ORAL_TABLET | ORAL | Status: DC

## 2013-02-03 MED ORDER — LOSARTAN POTASSIUM 100 MG PO TABS: ORAL_TABLET | ORAL | Status: DC
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
## 2013-02-03 NOTE — Patient Instructions (Signed)
Increase losartan to 100mg daily  Continue all other medications

## 2013-02-05 ENCOUNTER — Ambulatory Visit (HOSPITAL_COMMUNITY)
Admission: RE | Admit: 2013-02-05 | Discharge: 2013-02-05 | Disposition: A | Discharge status: Home / Self Care | Payer: Medicare Other | Source: Ambulatory Visit | Attending: Internal Medicine | Admitting: Internal Medicine

## 2013-02-05 DIAGNOSIS — Z1231 Encounter for screening mammogram for malignant neoplasm of breast: Secondary | ICD-10-CM | POA: Insufficient documentation

## 2013-02-24 ENCOUNTER — Other Ambulatory Visit: Payer: Self-pay | Admitting: Nurse Practitioner

## 2013-02-24 NOTE — Telephone Encounter (Signed)
Is it okay to refill the clorazepate 7.5 mg? Please advise. thanks

## 2013-02-27 ENCOUNTER — Other Ambulatory Visit: Payer: Self-pay | Admitting: Internal Medicine

## 2013-03-01 ENCOUNTER — Other Ambulatory Visit: Payer: Self-pay | Admitting: *Deleted

## 2013-03-12 ENCOUNTER — Other Ambulatory Visit (HOSPITAL_COMMUNITY): Payer: Self-pay | Admitting: Internal Medicine

## 2013-03-31 ENCOUNTER — Other Ambulatory Visit: Payer: Self-pay | Admitting: Internal Medicine

## 2013-04-03 ENCOUNTER — Other Ambulatory Visit: Payer: Self-pay | Admitting: Internal Medicine

## 2013-04-27 DIAGNOSIS — M9981 Other biomechanical lesions of cervical region: Secondary | ICD-10-CM | POA: Diagnosis not present

## 2013-04-27 DIAGNOSIS — M999 Biomechanical lesion, unspecified: Secondary | ICD-10-CM | POA: Diagnosis not present

## 2013-04-27 DIAGNOSIS — M5137 Other intervertebral disc degeneration, lumbosacral region: Secondary | ICD-10-CM | POA: Diagnosis not present

## 2013-04-27 DIAGNOSIS — IMO0002 Reserved for concepts with insufficient information to code with codable children: Secondary | ICD-10-CM | POA: Diagnosis not present

## 2013-04-27 DIAGNOSIS — M5030 Other cervical disc degeneration, unspecified cervical region: Secondary | ICD-10-CM | POA: Diagnosis not present

## 2013-05-03 ENCOUNTER — Other Ambulatory Visit: Payer: Medicare Other

## 2013-05-03 DIAGNOSIS — E785 Hyperlipidemia, unspecified: Secondary | ICD-10-CM

## 2013-05-03 DIAGNOSIS — I10 Essential (primary) hypertension: Secondary | ICD-10-CM | POA: Diagnosis not present

## 2013-05-04 LAB — LIPID PANEL
Chol/HDL Ratio: 2.1 ratio units (ref 0.0–4.4)
Cholesterol, Total: 144 mg/dL (ref 100–199)
HDL: 68 mg/dL (ref >39)
LDL Calculated: 68 mg/dL (ref 0–99)
Triglycerides: 38 mg/dL (ref 0–149)
VLDL Cholesterol Cal: 8 mg/dL (ref 5–40)

## 2013-05-04 LAB — BASIC METABOLIC PANEL
BUN/Creatinine Ratio: 18 (ref 11–26)
BUN: 16 mg/dL (ref 8–27)
CO2: 26 mmol/L (ref 18–29)
Calcium: 8.8 mg/dL (ref 8.7–10.3)
Chloride: 99 mmol/L (ref 97–108)
Creatinine, Ser: 0.89 mg/dL (ref 0.57–1.00)
GFR calc Af Amer: 76 mL/min/{1.73_m2} (ref >59)
GFR calc non Af Amer: 66 mL/min/{1.73_m2} (ref >59)
Glucose: 96 mg/dL (ref 65–99)
Potassium: 3.7 mmol/L (ref 3.5–5.2)
Sodium: 139 mmol/L (ref 134–144)

## 2013-05-05 ENCOUNTER — Encounter: Payer: Self-pay | Admitting: Internal Medicine

## 2013-05-05 ENCOUNTER — Ambulatory Visit (INDEPENDENT_AMBULATORY_CARE_PROVIDER_SITE_OTHER): Payer: Medicare Other | Admitting: Internal Medicine

## 2013-05-05 VITALS — BP 124/70 | HR 72 | Temp 99.3°F | Resp 20 | Ht 61.5 in | Wt 149.2 lb

## 2013-05-05 DIAGNOSIS — I10 Essential (primary) hypertension: Secondary | ICD-10-CM

## 2013-05-05 DIAGNOSIS — M542 Cervicalgia: Secondary | ICD-10-CM | POA: Diagnosis not present

## 2013-05-05 DIAGNOSIS — F17200 Nicotine dependence, unspecified, uncomplicated: Secondary | ICD-10-CM | POA: Insufficient documentation

## 2013-05-05 DIAGNOSIS — M545 Low back pain, unspecified: Secondary | ICD-10-CM

## 2013-05-05 DIAGNOSIS — E785 Hyperlipidemia, unspecified: Secondary | ICD-10-CM

## 2013-05-05 DIAGNOSIS — F411 Generalized anxiety disorder: Secondary | ICD-10-CM

## 2013-05-05 DIAGNOSIS — R609 Edema, unspecified: Secondary | ICD-10-CM

## 2013-05-05 MED ORDER — VARENICLINE TARTRATE 1 MG PO TABS: ORAL_TABLET | ORAL | Status: DC

## 2013-05-05 NOTE — Patient Instructions (Signed)
Try Chantix as a aid to stop smoking.

## 2013-05-05 NOTE — Progress Notes (Signed)
Patient ID: Felicia Sawyer, female   DOB: May 08, 1940, 73 y.o.   MRN: [id number]    Location:    [facility]  Place of Service:  office   Allergies  Allergen Reactions  . Demerol [Meperidine]   . Venlafaxine Hcl Other (See Comments)    Hyper  . Pseudoephedrine Palpitations    Chief Complaint  Patient presents with  . Follow-up    HPI:  Unspecified essential hypertension - controlled  Cervicalgia: persistent, but does not interfere with her daily activity  Tobacco use disorder - willing to try to stop  Other and unspecified hyperlipidemia - controled  Lumbago: persistent, but does not interfere with daily activity  Anxiety state, unspecified: unchanged  Edema: unchanged    Medications: Patient's Medications  New Prescriptions   No medications on file  Previous Medications   ASPIRIN 81 MG TABLET    Take 81 mg by mouth daily. Take 1 tablet once daily.   CITALOPRAM (CELEXA) 20 MG TABLET    Take 1 tablet (20 mg total) by mouth daily.   CLONIDINE (CATAPRES) 0.1 MG TABLET    TAKE 1 TABLET TWICE A DAY TO CONTROL BLOOD PRESSURE   CLORAZEPATE (TRANXENE) 7.5 MG TABLET    TAKE 1 TABLET TWICE A DAY FOR ANXIETY/REST   FEXOFENADINE (HM FEXOFENADINE HCL) 180 MG TABLET    Take 180 mg by mouth daily. Take 1 tablet daily as needed for allergies.   FISH OIL-OMEGA-3 FATTY ACIDS 1000 MG CAPSULE    Take 300 mg by mouth daily. Take 1 tablet daily.   FUROSEMIDE (LASIX) 40 MG TABLET    Take 1 tablet (40 mg total) by mouth daily.   GARLIC PO    Take by mouth. Take 1 tablet once daily.   GRAPE SEED EXTRACT 100 MG CAPS    Take 100 mg by mouth daily. Take 1 capsule once daily   HYOSCYAMINE (LEVBID) 0.375 MG 12 HR TABLET    Take 0.375 mg by mouth every 12 (twelve) hours as needed for cramping. Take 1 capsule once daily to reduce stomach cramps as needed.   LOSARTAN (COZAAR) 100 MG TABLET    One daily to control BP   METAXALONE (SKELAXIN) 800 MG TABLET    One up to 3 times daily to relax muscles   MIRTAZAPINE (REMERON) 30 MG TABLET    Take 30 mg by mouth at bedtime. Take 1 tablet nightly to help nerves and to rest.   SIMVASTATIN (ZOCOR) 40 MG TABLET    TAKE 1 TABLET EVERY DAY   VITAMIN E 400 UNIT CAPSULE    Take 400 Units by mouth daily.  Modified Medications   No medications on file  Discontinued Medications   No medications on file     Review of Systems  Constitutional: Negative for fever, chills, diaphoresis, activity change, appetite change and fatigue.  HENT: Negative.   Eyes: Positive for visual disturbance (corrective lenses).  Respiratory: Negative.        Continues to smoke  Cardiovascular: Positive for leg swelling. Negative for chest pain and palpitations.  Gastrointestinal: Negative for nausea, abdominal pain, diarrhea, constipation and abdominal distention.  Endocrine: Negative for cold intolerance, heat intolerance, polydipsia and polyphagia.  Genitourinary: Negative.   Musculoskeletal: Positive for neck pain.  Skin: Negative.   Allergic/Immunologic: Negative.   Hematological: Negative.   Psychiatric/Behavioral: Negative.        Feels depressed and tearful. Does not sleep well. Early AM awakening.    Filed Vitals:   05/05/13  1622  BP: 124/70  Pulse: 72  Temp: 99.3 F (37.4 C)  TempSrc: Oral  Resp: 20  Height: 5' 1.5" (1.562 m)  Weight: 149 lb 3.2 oz (67.677 kg)   Physical Exam  Constitutional: She is oriented to person, place, and time. She appears well-developed and well-nourished. No distress.  HENT:  Head: Normocephalic and atraumatic.  Right Ear: External ear normal.  Left Ear: External ear normal.  Nose: Nose normal.  Mouth/Throat: Oropharynx is clear and moist.  Eyes: Conjunctivae and EOM are normal. Pupils are equal, round, and reactive to light.  Neck: No JVD present. No tracheal deviation present. No thyromegaly present.  Cardiovascular: Normal rate, regular rhythm, normal heart sounds and intact distal pulses.  Exam reveals no gallop and  no friction rub.   No murmur heard. Pulmonary/Chest: No respiratory distress. She has no wheezes. She has no rales.  Abdominal: She exhibits no distension and no mass. There is no tenderness.  Musculoskeletal: She exhibits edema. She exhibits no tenderness.  Lymphadenopathy:    She has no cervical adenopathy.  Neurological: She is alert and oriented to person, place, and time. She has normal reflexes. No cranial nerve deficit. Coordination normal.  Skin: No rash noted. No erythema. No pallor.  Psychiatric: Her behavior is normal. Judgment and thought content normal.  Sad when describing her current life.     Labs reviewed: Appointment on 05/03/2013  Component Date Value Ref Range Status  . Glucose 05/03/2013 96  65 - 99 mg/dL Final  . BUN 05/03/2013 16  8 - 27 mg/dL Final  . Creatinine, Ser 05/03/2013 0.89  0.57 - 1.00 mg/dL Final  . GFR calc non Af Amer 05/03/2013 66  >59 mL/min/1.73 Final  . GFR calc Af Amer 05/03/2013 76  >59 mL/min/1.73 Final  . BUN/Creatinine Ratio 05/03/2013 18  11 - 26 Final  . Sodium 05/03/2013 139  134 - 144 mmol/L Final  . Potassium 05/03/2013 3.7  3.5 - 5.2 mmol/L Final  . Chloride 05/03/2013 99  97 - 108 mmol/L Final  . CO2 05/03/2013 26  18 - 29 mmol/L Final  . Calcium 05/03/2013 8.8  8.7 - 10.3 mg/dL Final  . Cholesterol, Total 05/03/2013 144  100 - 199 mg/dL Final  . Triglycerides 05/03/2013 38  0 - 149 mg/dL Final  . HDL 05/03/2013 68  >39 mg/dL Final   Comment: According to ATP-III Guidelines, HDL-C >59 mg/dL is considered a                          negative risk factor for CHD.  Marland Kitchen VLDL Cholesterol Cal 05/03/2013 8  5 - 40 mg/dL Final  . LDL Calculated 05/03/2013 68  0 - 99 mg/dL Final  . Chol/HDL Ratio 05/03/2013 2.1  0.0 - 4.4 ratio units Final   Comment:                                   T. Chol/HDL Ratio                                                                      Men  Women  1/2  Avg.Risk  3.4    3.3                                                            Avg.Risk  5.0    4.4                                                         2X Avg.Risk  9.6    7.1                                                         3X Avg.Risk 23.4   11.0      Assessment/Plan  1. Unspecified essential hypertension controlled - CMP; Future - EKG 12-Lead; Future  2. Cervicalgia unchanged  3. Tobacco use disorder - varenicline (CHANTIX CONTINUING MONTH PAK) 1 MG tablet; One twice daily to help stop smoking  Dispense: 60 tablet; Refill: 5  4. Other and unspecified hyperlipidemia - Lipid panel; Future  5. Lumbago unchanged  6. Anxiety state, unspecified Unchanged  7. Edema unchanged

## 2013-06-30 ENCOUNTER — Other Ambulatory Visit (HOSPITAL_COMMUNITY): Payer: Self-pay | Admitting: Internal Medicine

## 2013-08-05 ENCOUNTER — Other Ambulatory Visit: Payer: Self-pay | Admitting: Internal Medicine

## 2013-08-09 ENCOUNTER — Other Ambulatory Visit: Payer: Self-pay | Admitting: *Deleted

## 2013-08-18 ENCOUNTER — Other Ambulatory Visit: Payer: Self-pay | Admitting: Internal Medicine

## 2013-09-10 ENCOUNTER — Other Ambulatory Visit (HOSPITAL_COMMUNITY): Payer: Self-pay | Admitting: Internal Medicine

## 2013-09-21 DIAGNOSIS — M5137 Other intervertebral disc degeneration, lumbosacral region: Secondary | ICD-10-CM | POA: Diagnosis not present

## 2013-09-21 DIAGNOSIS — M999 Biomechanical lesion, unspecified: Secondary | ICD-10-CM | POA: Diagnosis not present

## 2013-09-21 DIAGNOSIS — M9981 Other biomechanical lesions of cervical region: Secondary | ICD-10-CM | POA: Diagnosis not present

## 2013-09-21 DIAGNOSIS — IMO0002 Reserved for concepts with insufficient information to code with codable children: Secondary | ICD-10-CM | POA: Diagnosis not present

## 2013-09-21 DIAGNOSIS — M5030 Other cervical disc degeneration, unspecified cervical region: Secondary | ICD-10-CM | POA: Diagnosis not present

## 2013-10-19 DIAGNOSIS — IMO0002 Reserved for concepts with insufficient information to code with codable children: Secondary | ICD-10-CM | POA: Diagnosis not present

## 2013-10-19 DIAGNOSIS — M999 Biomechanical lesion, unspecified: Secondary | ICD-10-CM | POA: Diagnosis not present

## 2013-10-19 DIAGNOSIS — M9981 Other biomechanical lesions of cervical region: Secondary | ICD-10-CM | POA: Diagnosis not present

## 2013-10-19 DIAGNOSIS — M5137 Other intervertebral disc degeneration, lumbosacral region: Secondary | ICD-10-CM | POA: Diagnosis not present

## 2013-10-19 DIAGNOSIS — M5030 Other cervical disc degeneration, unspecified cervical region: Secondary | ICD-10-CM | POA: Diagnosis not present

## 2013-11-05 ENCOUNTER — Other Ambulatory Visit: Payer: Medicare Other

## 2013-11-10 ENCOUNTER — Encounter: Payer: Medicare Other | Admitting: Internal Medicine

## 2013-11-19 ENCOUNTER — Other Ambulatory Visit: Payer: Self-pay | Admitting: Internal Medicine

## 2013-11-19 ENCOUNTER — Other Ambulatory Visit: Payer: Self-pay | Admitting: *Deleted

## 2013-11-19 MED ORDER — CLORAZEPATE DIPOTASSIUM 7.5 MG PO TABS: ORAL_TABLET | ORAL | Status: DC

## 2013-11-23 ENCOUNTER — Other Ambulatory Visit: Payer: Self-pay | Admitting: Internal Medicine

## 2013-11-23 DIAGNOSIS — M9981 Other biomechanical lesions of cervical region: Secondary | ICD-10-CM | POA: Diagnosis not present

## 2013-11-23 DIAGNOSIS — M999 Biomechanical lesion, unspecified: Secondary | ICD-10-CM | POA: Diagnosis not present

## 2013-11-23 DIAGNOSIS — M5030 Other cervical disc degeneration, unspecified cervical region: Secondary | ICD-10-CM | POA: Diagnosis not present

## 2013-11-23 DIAGNOSIS — IMO0002 Reserved for concepts with insufficient information to code with codable children: Secondary | ICD-10-CM | POA: Diagnosis not present

## 2013-11-23 DIAGNOSIS — M5137 Other intervertebral disc degeneration, lumbosacral region: Secondary | ICD-10-CM | POA: Diagnosis not present

## 2013-11-24 ENCOUNTER — Other Ambulatory Visit: Payer: Self-pay | Admitting: *Deleted

## 2013-11-24 MED ORDER — CLORAZEPATE DIPOTASSIUM 7.5 MG PO TABS: ORAL_TABLET | ORAL | Status: DC

## 2013-11-24 NOTE — Telephone Encounter (Signed)
Called in Medication to pharmacy per patient. The pharmacy stated that they already filled her medication on 9/25 and it is there for pick up. Patient notified.

## 2013-11-25 ENCOUNTER — Other Ambulatory Visit: Payer: Medicare Other

## 2013-11-25 DIAGNOSIS — E785 Hyperlipidemia, unspecified: Secondary | ICD-10-CM

## 2013-11-25 DIAGNOSIS — I10 Essential (primary) hypertension: Secondary | ICD-10-CM

## 2013-11-26 DIAGNOSIS — Z23 Encounter for immunization: Secondary | ICD-10-CM | POA: Diagnosis not present

## 2013-11-26 LAB — COMPREHENSIVE METABOLIC PANEL
ALT: 10 IU/L (ref 0–32)
AST: 16 IU/L (ref 0–40)
Albumin/Globulin Ratio: 1.5 (ref 1.1–2.5)
Albumin: 4 g/dL (ref 3.5–4.8)
Alkaline Phosphatase: 68 IU/L (ref 39–117)
BUN/Creatinine Ratio: 19 (ref 11–26)
BUN: 16 mg/dL (ref 8–27)
CO2: 27 mmol/L (ref 18–29)
Calcium: 9.2 mg/dL (ref 8.7–10.3)
Chloride: 101 mmol/L (ref 97–108)
Creatinine, Ser: 0.83 mg/dL (ref 0.57–1.00)
GFR calc Af Amer: 82 mL/min/{1.73_m2} (ref >59)
GFR calc non Af Amer: 71 mL/min/{1.73_m2} (ref >59)
Globulin, Total: 2.6 g/dL (ref 1.5–4.5)
Glucose: 93 mg/dL (ref 65–99)
Potassium: 4 mmol/L (ref 3.5–5.2)
Sodium: 142 mmol/L (ref 134–144)
Total Bilirubin: 0.5 mg/dL (ref 0.0–1.2)
Total Protein: 6.6 g/dL (ref 6.0–8.5)

## 2013-11-26 LAB — LIPID PANEL
Chol/HDL Ratio: 2.2 ratio units (ref 0.0–4.4)
Cholesterol, Total: 146 mg/dL (ref 100–199)
HDL: 65 mg/dL (ref >39)
LDL Calculated: 73 mg/dL (ref 0–99)
Triglycerides: 40 mg/dL (ref 0–149)
VLDL Cholesterol Cal: 8 mg/dL (ref 5–40)

## 2013-11-30 ENCOUNTER — Ambulatory Visit (INDEPENDENT_AMBULATORY_CARE_PROVIDER_SITE_OTHER): Payer: Medicare Other | Admitting: Internal Medicine

## 2013-11-30 ENCOUNTER — Encounter: Payer: Self-pay | Admitting: Internal Medicine

## 2013-11-30 VITALS — BP 128/66 | HR 72 | Temp 98.4°F | Resp 10 | Ht 61.0 in | Wt 156.0 lb

## 2013-11-30 DIAGNOSIS — R609 Edema, unspecified: Secondary | ICD-10-CM

## 2013-11-30 DIAGNOSIS — M545 Low back pain: Secondary | ICD-10-CM

## 2013-11-30 DIAGNOSIS — I872 Venous insufficiency (chronic) (peripheral): Secondary | ICD-10-CM

## 2013-11-30 DIAGNOSIS — F330 Major depressive disorder, recurrent, mild: Secondary | ICD-10-CM

## 2013-11-30 DIAGNOSIS — E785 Hyperlipidemia, unspecified: Secondary | ICD-10-CM

## 2013-11-30 DIAGNOSIS — I10 Essential (primary) hypertension: Secondary | ICD-10-CM | POA: Diagnosis not present

## 2013-11-30 DIAGNOSIS — G4700 Insomnia, unspecified: Secondary | ICD-10-CM

## 2013-11-30 DIAGNOSIS — M542 Cervicalgia: Secondary | ICD-10-CM

## 2013-11-30 MED ORDER — ZOSTER VACCINE LIVE 19400 UNT/0.65ML ~~LOC~~ SOLR: 0.6500 mL | Freq: Once | SUBCUTANEOUS | Status: DC

## 2013-11-30 MED ORDER — CLORAZEPATE DIPOTASSIUM 7.5 MG PO TABS: ORAL_TABLET | ORAL | Status: DC

## 2013-11-30 MED ORDER — LOSARTAN POTASSIUM 100 MG PO TABS: ORAL_TABLET | ORAL | Status: DC

## 2013-11-30 MED ORDER — METAXALONE 800 MG PO TABS: ORAL_TABLET | ORAL | Status: DC

## 2013-11-30 MED ORDER — CLONIDINE HCL 0.1 MG PO TABS: ORAL_TABLET | ORAL | Status: DC

## 2013-11-30 MED ORDER — MIRTAZAPINE 30 MG PO TABS: 30.0000 mg | ORAL_TABLET | Freq: Every day | ORAL | Status: DC

## 2013-11-30 MED ORDER — FUROSEMIDE 40 MG PO TABS: 40.0000 mg | ORAL_TABLET | Freq: Every day | ORAL | Status: DC

## 2013-11-30 NOTE — Progress Notes (Signed)
Patient ID: Felicia Sawyer, female   DOB: 03-16-1940, 73 y.o.   MRN: [id number]    Facility  [facility]    Place of Service:   OFFICE   Allergies  Allergen Reactions  . Demerol [Meperidine]   . Venlafaxine Hcl Other (See Comments)    Hyper  . Pseudoephedrine Palpitations    Chief Complaint  Patient presents with  . Annual Exam    Yearly check-up, discuss labs (copy printed)   . MMSE    28/30, passed clock drawing    HPI:  Essential hypertension, benign - controlled  Hyperlipemia - controlled  Edema - unchanged  Low back pain without sciatica, unspecified back pain laterality: unchanged  Chronic venous insufficiency: unchanged  Cervicalgia: chronic. No radiation of pain.  Major depressive disorder, recurrent episode, mild: improved on Remeron  Insomnia: improved    Medications: Patient's Medications  New Prescriptions   No medications on file  Previous Medications   ASPIRIN 81 MG TABLET    Take 81 mg by mouth daily. Take 1 tablet once daily.   CITALOPRAM (CELEXA) 20 MG TABLET    Take 1 tablet (20 mg total) by mouth daily.   COENZYME Q10 (COQ-10 PO)    Take by mouth daily.   FEXOFENADINE (HM FEXOFENADINE HCL) 180 MG TABLET    Take 180 mg by mouth daily. Take 1 tablet daily as needed for allergies.   GARLIC PO    Take by mouth. Take 1 tablet once daily.   GRAPE SEED EXTRACT 100 MG CAPS    Take 100 mg by mouth daily. Take 1 capsule once daily   HYOSCYAMINE (LEVBID) 0.375 MG 12 HR TABLET    Take 0.375 mg by mouth every 12 (twelve) hours as needed for cramping. Take 1 capsule once daily to reduce stomach cramps as needed.   KRILL OIL PO    Take by mouth daily.   SIMVASTATIN (ZOCOR) 40 MG TABLET    TAKE 1 TABLET EVERY DAY   VITAMIN E 400 UNIT CAPSULE    Take 400 Units by mouth daily.  Modified Medications   Modified Medication Previous Medication   CLONIDINE (CATAPRES) 0.1 MG TABLET cloNIDine (CATAPRES) 0.1 MG tablet      TAKE 1 TABLET TWICE A DAY TO CONTROL BLOOD  PRESSURE    TAKE 1 TABLET TWICE A DAY TO CONTROL BLOOD PRESSURE   CLORAZEPATE (TRANXENE) 7.5 MG TABLET clorazepate (TRANXENE) 7.5 MG tablet      Take one tablet by mouth twice daily for anxiety    Take one tablet by mouth twice daily for anxiety   FUROSEMIDE (LASIX) 40 MG TABLET furosemide (LASIX) 40 MG tablet      Take 1 tablet (40 mg total) by mouth daily.    Take 1 tablet (40 mg total) by mouth daily.   LOSARTAN (COZAAR) 100 MG TABLET losartan (COZAAR) 100 MG tablet      One daily to control BP    One daily to control BP   METAXALONE (SKELAXIN) 800 MG TABLET metaxalone (SKELAXIN) 800 MG tablet      TAKE ONE UP TO 3 TIMES DAILY TO RELAX MUSCLES    TAKE ONE UP TO 3 TIMES DAILY TO RELAX MUSCLES   MIRTAZAPINE (REMERON) 30 MG TABLET mirtazapine (REMERON) 30 MG tablet      Take 1 tablet (30 mg total) by mouth at bedtime. Take 1 tablet nightly to help nerves and to rest.    Take 30 mg by mouth at bedtime. Take  1 tablet nightly to help nerves and to rest.   ZOSTER VACCINE LIVE, PF, (ZOSTAVAX) 85885 UNT/0.65ML INJECTION zoster vaccine live, PF, (ZOSTAVAX) 02774 UNT/0.65ML injection      Inject 19,400 Units into the skin once.    Inject 0.65 mLs into the skin once.  Discontinued Medications   FISH OIL-OMEGA-3 FATTY ACIDS 1000 MG CAPSULE    Take 300 mg by mouth daily. Take 1 tablet daily.   VARENICLINE (CHANTIX CONTINUING MONTH PAK) 1 MG TABLET    One twice daily to help stop smoking     Review of Systems  Constitutional: Negative for fever, chills, diaphoresis, activity change, appetite change and fatigue.  HENT: Negative.   Eyes: Positive for visual disturbance (corrective lenses).  Respiratory: Negative.        Continues to smoke about 1/2 PPD  Cardiovascular: Positive for leg swelling. Negative for chest pain and palpitations.  Gastrointestinal: Negative for nausea, abdominal pain, diarrhea, constipation and abdominal distention.  Endocrine: Negative for cold intolerance, heat intolerance,  polydipsia and polyphagia.  Genitourinary: Negative.   Musculoskeletal: Positive for neck pain.  Skin: Negative.   Allergic/Immunologic: Negative.   Hematological: Negative.   Psychiatric/Behavioral: Negative.        Feels depressed and tearful. Does not sleep well. Early AM awakening.    Filed Vitals:   11/30/13 1531  BP: 128/66  Pulse: 72  Temp: 98.4 F (36.9 C)  TempSrc: Oral  Resp: 10  Height: 5\' 1"  (1.549 m)  Weight: 156 lb (70.761 kg)   Body mass index is 29.49 kg/(m^2).  Physical Exam  Constitutional: She is oriented to person, place, and time. She appears well-developed and well-nourished. No distress.  HENT:  Head: Normocephalic and atraumatic.  Right Ear: External ear normal.  Left Ear: External ear normal.  Nose: Nose normal.  Mouth/Throat: Oropharynx is clear and moist.  Eyes: Conjunctivae and EOM are normal. Pupils are equal, round, and reactive to light.  Neck: No JVD present. No tracheal deviation present. No thyromegaly present.  Cardiovascular: Normal rate, regular rhythm, normal heart sounds and intact distal pulses.  Exam reveals no gallop and no friction rub.   No murmur heard. Pulmonary/Chest: No respiratory distress. She has no wheezes. She has no rales.  Abdominal: She exhibits no distension and no mass. There is no tenderness.  Musculoskeletal: She exhibits edema. She exhibits no tenderness.  Lymphadenopathy:    She has no cervical adenopathy.  Neurological: She is alert and oriented to person, place, and time. She has normal reflexes. No cranial nerve deficit. Coordination normal.  Skin: No rash noted. No erythema. No pallor.  Psychiatric: Her behavior is normal. Judgment and thought content normal.  Sad when describing her current life.     Labs reviewed: Appointment on 11/25/2013  Component Date Value Ref Range Status  . Glucose 11/25/2013 93  65 - 99 mg/dL Final  . BUN 11/25/2013 16  8 - 27 mg/dL Final  . Creatinine, Ser 11/25/2013 0.83   0.57 - 1.00 mg/dL Final  . GFR calc non Af Amer 11/25/2013 71  >59 mL/min/1.73 Final  . GFR calc Af Amer 11/25/2013 82  >59 mL/min/1.73 Final  . BUN/Creatinine Ratio 11/25/2013 19  11 - 26 Final  . Sodium 11/25/2013 142  134 - 144 mmol/L Final  . Potassium 11/25/2013 4.0  3.5 - 5.2 mmol/L Final  . Chloride 11/25/2013 101  97 - 108 mmol/L Final  . CO2 11/25/2013 27  18 - 29 mmol/L Final  . Calcium 11/25/2013  9.2  8.7 - 10.3 mg/dL Final  . Total Protein 11/25/2013 6.6  6.0 - 8.5 g/dL Final  . Albumin 11/25/2013 4.0  3.5 - 4.8 g/dL Final  . Globulin, Total 11/25/2013 2.6  1.5 - 4.5 g/dL Final  . Albumin/Globulin Ratio 11/25/2013 1.5  1.1 - 2.5 Final  . Total Bilirubin 11/25/2013 0.5  0.0 - 1.2 mg/dL Final  . Alkaline Phosphatase 11/25/2013 68  39 - 117 IU/L Final  . AST 11/25/2013 16  0 - 40 IU/L Final  . ALT 11/25/2013 10  0 - 32 IU/L Final  . Cholesterol, Total 11/25/2013 146  100 - 199 mg/dL Final  . Triglycerides 11/25/2013 40  0 - 149 mg/dL Final  . HDL 11/25/2013 65  >39 mg/dL Final   Comment: According to ATP-III Guidelines, HDL-C >59 mg/dL is considered a                          negative risk factor for CHD.  Marland Kitchen VLDL Cholesterol Cal 11/25/2013 8  5 - 40 mg/dL Final  . LDL Calculated 11/25/2013 73  0 - 99 mg/dL Final  . Chol/HDL Ratio 11/25/2013 2.2  0.0 - 4.4 ratio units Final   Comment:                                   T. Chol/HDL Ratio                                                                      Men  Women                                                        1/2 Avg.Risk  3.4    3.3                                                            Avg.Risk  5.0    4.4                                                         2X Avg.Risk  9.6    7.1                                                         3X Avg.Risk 23.4   11.0   11/30/13 EKG: RATE 83. NSR. Normal  Assessment/Plan  1. Hyperlipemia - EKG 12-Lead  2. Edema unchanged -  furosemide (LASIX) 40 MG tablet;  Take 1 tablet (40 mg total) by mouth daily.  Dispense: 90 tablet; Refill: 3  3. Essential hypertension - losartan (COZAAR) 100 MG tablet; One daily to control BP  Dispense: 90 tablet; Refill: 3  4. Hyperlipidemia  5. Low back pain without sciatica, unspecified back pain laterality unchanged  6. Chronic venous insufficiency unchanged  7. Cervicalgia unchanged  8. Major depressive disorder, recurrent episode, mild Improved on Remeron  9. Insomnia improved

## 2013-11-30 NOTE — Progress Notes (Deleted)
Patient ID: Felicia Sawyer, female   DOB: 1940/09/23, 73 y.o.   MRN: [id number]    Facility      Place of Service:      Allergies  Allergen Reactions  . Demerol [Meperidine]   . Venlafaxine Hcl Other (See Comments)    Hyper  . Pseudoephedrine Palpitations    Chief Complaint  Patient presents with  . Annual Exam    Yearly check-up, discuss labs (copy printed)   . MMSE    28/30, passed clock drawing    HPI:  ***  Medications: Patient's Medications  New Prescriptions   No medications on file  Previous Medications   ASPIRIN 81 MG TABLET    Take 81 mg by mouth daily. Take 1 tablet once daily.   CITALOPRAM (CELEXA) 20 MG TABLET    Take 1 tablet (20 mg total) by mouth daily.   COENZYME Q10 (COQ-10 PO)    Take by mouth daily.   FEXOFENADINE (HM FEXOFENADINE HCL) 180 MG TABLET    Take 180 mg by mouth daily. Take 1 tablet daily as needed for allergies.   GARLIC PO    Take by mouth. Take 1 tablet once daily.   GRAPE SEED EXTRACT 100 MG CAPS    Take 100 mg by mouth daily. Take 1 capsule once daily   HYOSCYAMINE (LEVBID) 0.375 MG 12 HR TABLET    Take 0.375 mg by mouth every 12 (twelve) hours as needed for cramping. Take 1 capsule once daily to reduce stomach cramps as needed.   KRILL OIL PO    Take by mouth daily.   SIMVASTATIN (ZOCOR) 40 MG TABLET    TAKE 1 TABLET EVERY DAY   VITAMIN E 400 UNIT CAPSULE    Take 400 Units by mouth daily.  Modified Medications   Modified Medication Previous Medication   CLONIDINE (CATAPRES) 0.1 MG TABLET cloNIDine (CATAPRES) 0.1 MG tablet      TAKE 1 TABLET TWICE A DAY TO CONTROL BLOOD PRESSURE    TAKE 1 TABLET TWICE A DAY TO CONTROL BLOOD PRESSURE   CLORAZEPATE (TRANXENE) 7.5 MG TABLET clorazepate (TRANXENE) 7.5 MG tablet      Take one tablet by mouth twice daily for anxiety    Take one tablet by mouth twice daily for anxiety   FUROSEMIDE (LASIX) 40 MG TABLET furosemide (LASIX) 40 MG tablet      Take 1 tablet (40 mg total) by mouth daily.     Take 1 tablet (40 mg total) by mouth daily.   LOSARTAN (COZAAR) 100 MG TABLET losartan (COZAAR) 100 MG tablet      One daily to control BP    One daily to control BP   METAXALONE (SKELAXIN) 800 MG TABLET metaxalone (SKELAXIN) 800 MG tablet      TAKE ONE UP TO 3 TIMES DAILY TO RELAX MUSCLES    TAKE ONE UP TO 3 TIMES DAILY TO RELAX MUSCLES   MIRTAZAPINE (REMERON) 30 MG TABLET mirtazapine (REMERON) 30 MG tablet      Take 1 tablet (30 mg total) by mouth at bedtime. Take 1 tablet nightly to help nerves and to rest.    Take 30 mg by mouth at bedtime. Take 1 tablet nightly to help nerves and to rest.   ZOSTER VACCINE LIVE, PF, (ZOSTAVAX) 15400 UNT/0.65ML INJECTION zoster vaccine live, PF, (ZOSTAVAX) 86761 UNT/0.65ML injection      Inject 19,400 Units into the skin once.    Inject 0.65 mLs into the skin once.  Discontinued Medications   FISH OIL-OMEGA-3 FATTY ACIDS 1000 MG CAPSULE    Take 300 mg by mouth daily. Take 1 tablet daily.   VARENICLINE (CHANTIX CONTINUING MONTH PAK) 1 MG TABLET    One twice daily to help stop smoking     Review of Systems  Filed Vitals:   11/30/13 1531  BP: 128/66  Pulse: 72  Temp: 98.4 F (36.9 C)  TempSrc: Oral  Resp: 10  Height: 5\' 1"  (1.549 m)  Weight: 156 lb (70.761 kg)   Body mass index is 29.49 kg/(m^2).  Physical Exam   Labs reviewed: Appointment on 11/25/2013  Component Date Value Ref Range Status  . Glucose 11/25/2013 93  65 - 99 mg/dL Final  . BUN 11/25/2013 16  8 - 27 mg/dL Final  . Creatinine, Ser 11/25/2013 0.83  0.57 - 1.00 mg/dL Final  . GFR calc non Af Amer 11/25/2013 71  >59 mL/min/1.73 Final  . GFR calc Af Amer 11/25/2013 82  >59 mL/min/1.73 Final  . BUN/Creatinine Ratio 11/25/2013 19  11 - 26 Final  . Sodium 11/25/2013 142  134 - 144 mmol/L Final  . Potassium 11/25/2013 4.0  3.5 - 5.2 mmol/L Final  . Chloride 11/25/2013 101  97 - 108 mmol/L Final  . CO2 11/25/2013 27  18 - 29 mmol/L Final  . Calcium 11/25/2013 9.2  8.7 - 10.3 mg/dL  Final  . Total Protein 11/25/2013 6.6  6.0 - 8.5 g/dL Final  . Albumin 11/25/2013 4.0  3.5 - 4.8 g/dL Final  . Globulin, Total 11/25/2013 2.6  1.5 - 4.5 g/dL Final  . Albumin/Globulin Ratio 11/25/2013 1.5  1.1 - 2.5 Final  . Total Bilirubin 11/25/2013 0.5  0.0 - 1.2 mg/dL Final  . Alkaline Phosphatase 11/25/2013 68  39 - 117 IU/L Final  . AST 11/25/2013 16  0 - 40 IU/L Final  . ALT 11/25/2013 10  0 - 32 IU/L Final  . Cholesterol, Total 11/25/2013 146  100 - 199 mg/dL Final  . Triglycerides 11/25/2013 40  0 - 149 mg/dL Final  . HDL 11/25/2013 65  >39 mg/dL Final   Comment: According to ATP-III Guidelines, HDL-C >59 mg/dL is considered a                          negative risk factor for CHD.  Marland Kitchen VLDL Cholesterol Cal 11/25/2013 8  5 - 40 mg/dL Final  . LDL Calculated 11/25/2013 73  0 - 99 mg/dL Final  . Chol/HDL Ratio 11/25/2013 2.2  0.0 - 4.4 ratio units Final   Comment:                                   T. Chol/HDL Ratio                                                                      Men  Women  1/2 Avg.Risk  3.4    3.3                                                            Avg.Risk  5.0    4.4                                                         2X Avg.Risk  9.6    7.1                                                         3X Avg.Risk 23.4   11.0     Assessment/Plan

## 2013-11-30 NOTE — Progress Notes (Signed)
Passed clock drawing 

## 2013-12-01 ENCOUNTER — Other Ambulatory Visit: Payer: Self-pay | Admitting: Internal Medicine

## 2014-01-17 ENCOUNTER — Other Ambulatory Visit: Payer: Self-pay | Admitting: Internal Medicine

## 2014-01-17 DIAGNOSIS — Z1231 Encounter for screening mammogram for malignant neoplasm of breast: Secondary | ICD-10-CM

## 2014-02-08 ENCOUNTER — Ambulatory Visit (HOSPITAL_COMMUNITY)
Admission: RE | Admit: 2014-02-08 | Discharge: 2014-02-08 | Disposition: A | Discharge status: Home / Self Care | Payer: Medicare Other | Source: Ambulatory Visit | Attending: Internal Medicine | Admitting: Internal Medicine

## 2014-02-08 DIAGNOSIS — Z1231 Encounter for screening mammogram for malignant neoplasm of breast: Secondary | ICD-10-CM

## 2014-03-07 ENCOUNTER — Other Ambulatory Visit: Payer: Self-pay | Admitting: Internal Medicine

## 2014-05-29 ENCOUNTER — Other Ambulatory Visit: Payer: Self-pay | Admitting: Internal Medicine

## 2014-05-30 ENCOUNTER — Other Ambulatory Visit: Payer: Self-pay | Admitting: Internal Medicine

## 2014-06-07 ENCOUNTER — Ambulatory Visit: Payer: Medicare Other | Admitting: Internal Medicine

## 2014-06-29 ENCOUNTER — Ambulatory Visit (INDEPENDENT_AMBULATORY_CARE_PROVIDER_SITE_OTHER): Payer: Medicare HMO | Admitting: Internal Medicine

## 2014-06-29 ENCOUNTER — Encounter: Payer: Self-pay | Admitting: Internal Medicine

## 2014-06-29 VITALS — BP 122/80 | HR 76 | Temp 97.9°F | Resp 16 | Ht 61.0 in | Wt 159.0 lb

## 2014-06-29 DIAGNOSIS — M545 Low back pain: Secondary | ICD-10-CM

## 2014-06-29 DIAGNOSIS — I10 Essential (primary) hypertension: Secondary | ICD-10-CM | POA: Diagnosis not present

## 2014-06-29 DIAGNOSIS — E785 Hyperlipidemia, unspecified: Secondary | ICD-10-CM | POA: Diagnosis not present

## 2014-06-29 DIAGNOSIS — E2839 Other primary ovarian failure: Secondary | ICD-10-CM

## 2014-06-29 DIAGNOSIS — G4700 Insomnia, unspecified: Secondary | ICD-10-CM | POA: Diagnosis not present

## 2014-06-29 DIAGNOSIS — F330 Major depressive disorder, recurrent, mild: Secondary | ICD-10-CM | POA: Diagnosis not present

## 2014-06-29 DIAGNOSIS — F411 Generalized anxiety disorder: Secondary | ICD-10-CM

## 2014-06-29 DIAGNOSIS — R609 Edema, unspecified: Secondary | ICD-10-CM

## 2014-06-29 MED ORDER — LOSARTAN POTASSIUM 100 MG PO TABS: ORAL_TABLET | ORAL | Status: DC

## 2014-06-29 MED ORDER — CLORAZEPATE DIPOTASSIUM 7.5 MG PO TABS: ORAL_TABLET | ORAL | Status: DC

## 2014-06-29 MED ORDER — HYOSCYAMINE SULFATE ER 0.375 MG PO TB12: ORAL_TABLET | ORAL | Status: DC

## 2014-06-29 MED ORDER — CLONIDINE HCL 0.1 MG PO TABS: ORAL_TABLET | ORAL | Status: DC

## 2014-06-29 MED ORDER — FUROSEMIDE 40 MG PO TABS: ORAL_TABLET | ORAL | Status: DC

## 2014-06-29 MED ORDER — SIMVASTATIN 40 MG PO TABS: 40.0000 mg | ORAL_TABLET | Freq: Every day | ORAL | Status: DC

## 2014-06-29 MED ORDER — MIRTAZAPINE 30 MG PO TABS: ORAL_TABLET | ORAL | Status: DC

## 2014-06-29 NOTE — Progress Notes (Signed)
Patient ID: Felicia Sawyer, female   DOB: 01-25-1941, 74 y.o.   MRN: [id number]    Facility  [facility]    Place of Service:   OFFICE    Allergies  Allergen Reactions  . Demerol [Meperidine]   . Venlafaxine Hcl Other (See Comments)    Hyper  . Pseudoephedrine Palpitations    Chief Complaint  Patient presents with  . Medical Management of Chronic Issues    6 month follow-up, no recent labs   . Referral    Place order for Bone Density     HPI:  Essential hypertension - controlled  Edema: Unchanged at about 1+ bilaterally  Hyperlipidemia: No recent follow-up  Low back pain without sciatica, unspecified back pain laterality: Mild and does not inhibit her activities.    Medications: Patient's Medications  New Prescriptions   No medications on file  Previous Medications   ASPIRIN 81 MG TABLET    Take 81 mg by mouth daily. Take 1 tablet once daily.   CITALOPRAM (CELEXA) 20 MG TABLET    Take 1 tablet (20 mg total) by mouth daily.   CLONIDINE (CATAPRES) 0.1 MG TABLET    TAKE 1 TABLET TWICE A DAY TO CONTROL BLOOD PRESSURE   CLORAZEPATE (TRANXENE) 7.5 MG TABLET    Take one tablet by mouth twice daily for anxiety   COENZYME Q10 (COQ-10 PO)    Take by mouth daily.   FEXOFENADINE (HM FEXOFENADINE HCL) 180 MG TABLET    Take 180 mg by mouth daily. Take 1 tablet daily as needed for allergies.   FUROSEMIDE (LASIX) 40 MG TABLET    TAKE 1 TABLET (40 MG TOTAL) BY MOUTH DAILY.   GARLIC PO    Take by mouth. Take 1 tablet once daily.   HYOSCYAMINE (LEVBID) 0.375 MG 12 HR TABLET    Take 0.375 mg by mouth every 12 (twelve) hours as needed for cramping. Take 1 capsule once daily to reduce stomach cramps as needed.   KRILL OIL PO    Take by mouth daily.   LOSARTAN (COZAAR) 100 MG TABLET    One daily to control BP   METAXALONE (SKELAXIN) 800 MG TABLET    TAKE ONE UP TO 3 TIMES DAILY TO RELAX MUSCLES   MIRTAZAPINE (REMERON) 30 MG TABLET    TAKE 1 TABLET BY MOUTH AT BEDTIME TO HELP NERVES AND TO  REST   SIMVASTATIN (ZOCOR) 40 MG TABLET    TAKE 1 TABLET EVERY DAY   VITAMIN E 400 UNIT CAPSULE    Take 400 Units by mouth daily.  Modified Medications   No medications on file  Discontinued Medications   FUROSEMIDE (LASIX) 40 MG TABLET    Take 1 tablet (40 mg total) by mouth daily.   GRAPE SEED EXTRACT 100 MG CAPS    Take 100 mg by mouth daily. Take 1 capsule once daily   ZOSTER VACCINE LIVE, PF, (ZOSTAVAX) 75643 UNT/0.65ML INJECTION    Inject 19,400 Units into the skin once.     Review of Systems  Constitutional: Negative for fever, chills, diaphoresis, activity change, appetite change and fatigue.  HENT: Negative.   Eyes: Positive for visual disturbance (corrective lenses).  Respiratory: Negative.        Continues to smoke about 1/2 PPD  Cardiovascular: Positive for leg swelling. Negative for chest pain and palpitations.  Gastrointestinal: Negative for nausea, abdominal pain, diarrhea, constipation and abdominal distention.  Endocrine: Negative for cold intolerance, heat intolerance, polydipsia and polyphagia.  Genitourinary: Negative.  Musculoskeletal: Positive for back pain and neck pain.  Skin: Negative.   Allergic/Immunologic: Negative.   Hematological: Negative.   Psychiatric/Behavioral: Negative.        Depression has improved and she is sleeping better.      Filed Vitals:   06/29/14 1327  BP: 122/80  Pulse: 76  Temp: 97.9 F (36.6 C)  TempSrc: Oral  Resp: 16  Height: 5\' 1"  (1.549 m)  Weight: 159 lb (72.122 kg)   Body mass index is 30.06 kg/(m^2).  Physical Exam  Constitutional: She is oriented to person, place, and time. She appears well-developed and well-nourished. No distress.  HENT:  Head: Normocephalic and atraumatic.  Right Ear: External ear normal.  Left Ear: External ear normal.  Nose: Nose normal.  Mouth/Throat: Oropharynx is clear and moist.  Eyes: Conjunctivae and EOM are normal. Pupils are equal, round, and reactive to light.  Neck: No JVD  present. No tracheal deviation present. No thyromegaly present.  Cardiovascular: Normal rate, regular rhythm, normal heart sounds and intact distal pulses.  Exam reveals no gallop and no friction rub.   No murmur heard. Pulmonary/Chest: No respiratory distress. She has no wheezes. She has no rales.  Abdominal: She exhibits no distension and no mass. There is no tenderness.  Musculoskeletal: She exhibits edema. She exhibits no tenderness.  Lymphadenopathy:    She has no cervical adenopathy.  Neurological: She is alert and oriented to person, place, and time. She has normal reflexes. No cranial nerve deficit. Coordination normal.  Skin: No rash noted. No erythema. No pallor.  Psychiatric: Her behavior is normal. Judgment and thought content normal.  Sad when describing her current life.     Labs reviewed: No visits with results within 3 Month(s) from this visit. Latest known visit with results is:  Appointment on 11/25/2013  Component Date Value Ref Range Status  . Glucose 11/25/2013 93  65 - 99 mg/dL Final  . BUN 11/25/2013 16  8 - 27 mg/dL Final  . Creatinine, Ser 11/25/2013 0.83  0.57 - 1.00 mg/dL Final  . GFR calc non Af Amer 11/25/2013 71  >59 mL/min/1.73 Final  . GFR calc Af Amer 11/25/2013 82  >59 mL/min/1.73 Final  . BUN/Creatinine Ratio 11/25/2013 19  11 - 26 Final  . Sodium 11/25/2013 142  134 - 144 mmol/L Final  . Potassium 11/25/2013 4.0  3.5 - 5.2 mmol/L Final  . Chloride 11/25/2013 101  97 - 108 mmol/L Final  . CO2 11/25/2013 27  18 - 29 mmol/L Final  . Calcium 11/25/2013 9.2  8.7 - 10.3 mg/dL Final  . Total Protein 11/25/2013 6.6  6.0 - 8.5 g/dL Final  . Albumin 11/25/2013 4.0  3.5 - 4.8 g/dL Final  . Globulin, Total 11/25/2013 2.6  1.5 - 4.5 g/dL Final  . Albumin/Globulin Ratio 11/25/2013 1.5  1.1 - 2.5 Final  . Total Bilirubin 11/25/2013 0.5  0.0 - 1.2 mg/dL Final  . Alkaline Phosphatase 11/25/2013 68  39 - 117 IU/L Final  . AST 11/25/2013 16  0 - 40 IU/L Final    . ALT 11/25/2013 10  0 - 32 IU/L Final  . Cholesterol, Total 11/25/2013 146  100 - 199 mg/dL Final  . Triglycerides 11/25/2013 40  0 - 149 mg/dL Final  . HDL 11/25/2013 65  >39 mg/dL Final   Comment: According to ATP-III Guidelines, HDL-C >59 mg/dL is considered a  negative risk factor for CHD.  Marland Kitchen VLDL Cholesterol Cal 11/25/2013 8  5 - 40 mg/dL Final  . LDL Calculated 11/25/2013 73  0 - 99 mg/dL Final  . Chol/HDL Ratio 11/25/2013 2.2  0.0 - 4.4 ratio units Final   Comment:                                   T. Chol/HDL Ratio                                                                      Men  Women                                                        1/2 Avg.Risk  3.4    3.3                                                            Avg.Risk  5.0    4.4                                                         2X Avg.Risk  9.6    7.1                                                         3X Avg.Risk 23.4   11.0     Assessment/Plan  1. Essential hypertension - cloNIDine (CATAPRES) 0.1 MG tablet; TAKE 1 TABLET TWICE A DAY TO CONTROL BLOOD PRESSURE  Dispense: 180 tablet; Refill: 3 - losartan (COZAAR) 100 MG tablet; One daily to control BP  Dispense: 90 tablet; Refill: 3 - Comprehensive metabolic panel; Future  2. Edema - furosemide (LASIX) 40 MG tablet; TAKE 1 TABLET (40 MG TOTAL) BY MOUTH DAILY.  Dispense: 90 tablet; Refill: 3 - Comprehensive metabolic panel; Future  3. Hyperlipidemia - simvastatin (ZOCOR) 40 MG tablet; Take 1 tablet (40 mg total) by mouth daily.  Dispense: 90 tablet; Refill: 3 - Lipid panel; Future  4. Low back pain without sciatica, unspecified back pain laterality - DG Bone Density; Future  5. Anxiety state - clorazepate (TRANXENE) 7.5 MG tablet; Take one tablet by mouth twice daily for anxiety  Dispense: 180 tablet; Refill: 3 - hyoscyamine (LEVBID) 0.375 MG 12 hr tablet; Take 1 capsule once daily to reduce stomach cramps as  needed.  Dispense: 60 tablet; Refill: 5  6. Major depressive disorder, recurrent episode, mild On mirtazapine  7. Insomnia -  mirtazapine (REMERON) 30 MG tablet; TAKE 1 TABLET BY MOUTH AT BEDTIME TO HELP NERVES AND TO REST  Dispense: 90 tablet; Refill: 3

## 2014-07-27 NOTE — Addendum Note (Signed)
Addended by: Rafael Bihari A on: 07/27/2014 01:45 PM   Modules accepted: Orders

## 2014-07-29 NOTE — Addendum Note (Signed)
Addended by: Rafael Bihari A on: 07/29/2014 09:03 AM   Modules accepted: Orders

## 2014-08-08 ENCOUNTER — Ambulatory Visit
Admission: RE | Admit: 2014-08-08 | Discharge: 2014-08-08 | Disposition: A | Discharge status: Home / Self Care | Payer: Medicare HMO | Source: Ambulatory Visit | Attending: Internal Medicine | Admitting: Internal Medicine

## 2014-08-08 DIAGNOSIS — E2839 Other primary ovarian failure: Secondary | ICD-10-CM

## 2014-08-08 DIAGNOSIS — M545 Low back pain: Secondary | ICD-10-CM

## 2014-09-07 ENCOUNTER — Other Ambulatory Visit: Payer: Self-pay | Admitting: Nurse Practitioner

## 2014-11-29 DIAGNOSIS — Z23 Encounter for immunization: Secondary | ICD-10-CM | POA: Diagnosis not present

## 2015-01-01 ENCOUNTER — Other Ambulatory Visit: Payer: Self-pay | Admitting: Internal Medicine

## 2015-01-02 ENCOUNTER — Other Ambulatory Visit: Payer: Medicare HMO

## 2015-01-04 ENCOUNTER — Ambulatory Visit: Payer: Medicare HMO | Admitting: Internal Medicine

## 2015-01-16 ENCOUNTER — Other Ambulatory Visit: Payer: Self-pay

## 2015-01-16 DIAGNOSIS — Z1231 Encounter for screening mammogram for malignant neoplasm of breast: Secondary | ICD-10-CM

## 2015-01-23 ENCOUNTER — Other Ambulatory Visit: Payer: Medicare HMO

## 2015-01-23 DIAGNOSIS — E785 Hyperlipidemia, unspecified: Secondary | ICD-10-CM | POA: Diagnosis not present

## 2015-01-23 DIAGNOSIS — I10 Essential (primary) hypertension: Secondary | ICD-10-CM | POA: Diagnosis not present

## 2015-01-23 DIAGNOSIS — R609 Edema, unspecified: Secondary | ICD-10-CM | POA: Diagnosis not present

## 2015-01-24 ENCOUNTER — Ambulatory Visit: Payer: Medicare HMO | Admitting: Internal Medicine

## 2015-01-24 LAB — COMPREHENSIVE METABOLIC PANEL
ALT: 10 IU/L (ref 0–32)
AST: 17 IU/L (ref 0–40)
Albumin/Globulin Ratio: 1.4 (ref 1.1–2.5)
Albumin: 3.7 g/dL (ref 3.5–4.8)
Alkaline Phosphatase: 65 IU/L (ref 39–117)
BUN/Creatinine Ratio: 16 (ref 11–26)
BUN: 12 mg/dL (ref 8–27)
Bilirubin Total: 0.5 mg/dL (ref 0.0–1.2)
CO2: 27 mmol/L (ref 18–29)
Calcium: 8.8 mg/dL (ref 8.7–10.3)
Chloride: 101 mmol/L (ref 97–106)
Creatinine, Ser: 0.76 mg/dL (ref 0.57–1.00)
GFR calc Af Amer: 91 mL/min/{1.73_m2} (ref >59)
GFR calc non Af Amer: 79 mL/min/{1.73_m2} (ref >59)
Globulin, Total: 2.6 g/dL (ref 1.5–4.5)
Glucose: 90 mg/dL (ref 65–99)
Potassium: 3.7 mmol/L (ref 3.5–5.2)
Sodium: 141 mmol/L (ref 136–144)
Total Protein: 6.3 g/dL (ref 6.0–8.5)

## 2015-01-24 LAB — LIPID PANEL
Chol/HDL Ratio: 2.4 ratio units (ref 0.0–4.4)
Cholesterol, Total: 132 mg/dL (ref 100–199)
HDL: 54 mg/dL (ref >39)
LDL Calculated: 66 mg/dL (ref 0–99)
Triglycerides: 59 mg/dL (ref 0–149)
VLDL Cholesterol Cal: 12 mg/dL (ref 5–40)

## 2015-01-25 ENCOUNTER — Ambulatory Visit (INDEPENDENT_AMBULATORY_CARE_PROVIDER_SITE_OTHER): Payer: Medicare HMO | Admitting: Internal Medicine

## 2015-01-25 ENCOUNTER — Encounter: Payer: Self-pay | Admitting: Internal Medicine

## 2015-01-25 VITALS — BP 140/72 | HR 84 | Temp 98.0°F | Resp 20 | Ht 61.0 in | Wt 162.2 lb

## 2015-01-25 DIAGNOSIS — F411 Generalized anxiety disorder: Secondary | ICD-10-CM

## 2015-01-25 DIAGNOSIS — F3341 Major depressive disorder, recurrent, in partial remission: Secondary | ICD-10-CM

## 2015-01-25 DIAGNOSIS — F418 Other specified anxiety disorders: Secondary | ICD-10-CM | POA: Diagnosis not present

## 2015-01-25 DIAGNOSIS — G4700 Insomnia, unspecified: Secondary | ICD-10-CM | POA: Diagnosis not present

## 2015-01-25 DIAGNOSIS — M542 Cervicalgia: Secondary | ICD-10-CM | POA: Diagnosis not present

## 2015-01-25 DIAGNOSIS — I10 Essential (primary) hypertension: Secondary | ICD-10-CM

## 2015-01-25 DIAGNOSIS — R609 Edema, unspecified: Secondary | ICD-10-CM | POA: Diagnosis not present

## 2015-01-25 DIAGNOSIS — K5901 Slow transit constipation: Secondary | ICD-10-CM | POA: Diagnosis not present

## 2015-01-25 DIAGNOSIS — R69 Illness, unspecified: Secondary | ICD-10-CM | POA: Diagnosis not present

## 2015-01-25 DIAGNOSIS — K5900 Constipation, unspecified: Secondary | ICD-10-CM | POA: Insufficient documentation

## 2015-01-25 DIAGNOSIS — E785 Hyperlipidemia, unspecified: Secondary | ICD-10-CM

## 2015-01-25 MED ORDER — SIMVASTATIN 40 MG PO TABS: 40.0000 mg | ORAL_TABLET | Freq: Every day | ORAL | Status: DC

## 2015-01-25 MED ORDER — LOSARTAN POTASSIUM 100 MG PO TABS: ORAL_TABLET | ORAL | Status: DC

## 2015-01-25 MED ORDER — METAXALONE 800 MG PO TABS: ORAL_TABLET | ORAL | Status: DC

## 2015-01-25 MED ORDER — CLORAZEPATE DIPOTASSIUM 7.5 MG PO TABS: ORAL_TABLET | ORAL | Status: DC

## 2015-01-25 MED ORDER — HYOSCYAMINE SULFATE ER 0.375 MG PO TB12: ORAL_TABLET | ORAL | Status: DC

## 2015-01-25 MED ORDER — CLONIDINE HCL 0.1 MG PO TABS: ORAL_TABLET | ORAL | Status: DC

## 2015-01-25 MED ORDER — MIRTAZAPINE 30 MG PO TABS: ORAL_TABLET | ORAL | Status: DC

## 2015-01-25 MED ORDER — FUROSEMIDE 40 MG PO TABS: ORAL_TABLET | ORAL | Status: DC

## 2015-01-25 MED ORDER — SENNOSIDES-DOCUSATE SODIUM 8.6-50 MG PO TABS: ORAL_TABLET | ORAL | Status: DC

## 2015-01-25 NOTE — Progress Notes (Signed)
Patient ID: Felicia Sawyer, female   DOB: [birth year], 74 y.o.   MRN: [id number]    Facility  [facility]    Place of Service:   OFFICE    Allergies  Allergen Reactions  . Demerol [Meperidine]   . Venlafaxine Hcl Other (See Comments)    Hyper  . Pseudoephedrine Palpitations    Chief Complaint  Patient presents with  . Medical Management of Chronic Issues    6 mo f/u    HPI:  Hyperlipidemia - controlled on simvastatin  Insomnia - benefits from mirtazapine (REMERON) 30 MG tablet  Essential hypertension - controlled on losartan and clonidine  Anxiety state - benefits from clorazepate (TRANXENE) 7.5 MG tablet  Edema, unspecified type -stable on current dose furosemide  Depression with anxiety -no longer taking citalopram. Has been stable on mirtazapine.  Recurrent major depressive disorder, in partial remission (HCC) - benefits from mirtazapine  Cervicalgia - chronic neck discomfort  Slow transit constipation - has been using stool softeners    Medications: Patient's Medications  New Prescriptions   No medications on file  Previous Medications   ASPIRIN 81 MG TABLET    Take 81 mg by mouth daily. Take 1 tablet once daily.   CITALOPRAM (CELEXA) 20 MG TABLET    Take 1 tablet (20 mg total) by mouth daily.   CLONIDINE (CATAPRES) 0.1 MG TABLET    TAKE 1 TABLET TWICE A DAY TO CONTROL BLOOD PRESSURE   CLORAZEPATE (TRANXENE) 7.5 MG TABLET    Take one tablet by mouth twice daily for anxiety   COENZYME Q10 (COQ-10 PO)    Take by mouth daily.   FEXOFENADINE (HM FEXOFENADINE HCL) 180 MG TABLET    Take 180 mg by mouth daily. Take 1 tablet daily as needed for allergies.   FUROSEMIDE (LASIX) 40 MG TABLET    TAKE 1 TABLET (40 MG TOTAL) BY MOUTH DAILY.   GARLIC PO    Take by mouth. Take 1 tablet once daily.   HYOSCYAMINE (LEVBID) 0.375 MG 12 HR TABLET    Take 1 capsule once daily to reduce stomach cramps as needed.   KRILL OIL PO    Take by mouth daily.   LOSARTAN (COZAAR) 100 MG  TABLET    One daily to control BP   METAXALONE (SKELAXIN) 800 MG TABLET    TAKE 1 TABLET BY MOUTH 3 TIMES A DAY TO RELAX MUSCLES   MIRTAZAPINE (REMERON) 30 MG TABLET    TAKE 1 TABLET BY MOUTH AT BEDTIME TO HELP NERVES AND TO REST   SIMVASTATIN (ZOCOR) 40 MG TABLET    Take 1 tablet (40 mg total) by mouth daily.   VITAMIN E 400 UNIT CAPSULE    Take 400 Units by mouth daily.  Modified Medications   No medications on file  Discontinued Medications   SIMVASTATIN (ZOCOR) 40 MG TABLET    TAKE 1 TABLET EVERY DAY    Review of Systems  Constitutional: Negative for fever, chills, diaphoresis, activity change, appetite change and fatigue.  HENT: Negative.   Eyes: Positive for visual disturbance (corrective lenses).  Respiratory:       Continues to smoke about 1/2 PPD  Cardiovascular: Positive for leg swelling. Negative for chest pain and palpitations.  Gastrointestinal: Positive for constipation (firm stools). Negative for nausea, abdominal pain, diarrhea and abdominal distention.  Endocrine: Negative for cold intolerance, heat intolerance, polydipsia and polyphagia.  Genitourinary: Negative.   Musculoskeletal: Positive for back pain and neck pain.  Skin: Negative.  Allergic/Immunologic: Negative.   Hematological: Negative.   Psychiatric/Behavioral:       Depression has improved and she is sleeping better.    Filed Vitals:   01/25/15 1342  BP: 140/72  Pulse: 84  Temp: 98 F (36.7 C)  TempSrc: Oral  Resp: 20  Height: '5\' 1"'  (1.549 m)  Weight: 162 lb 3.2 oz (73.573 kg)  SpO2: 94%   Body mass index is 30.66 kg/(m^2).  Physical Exam  Constitutional: She is oriented to person, place, and time. She appears well-developed and well-nourished. No distress.  HENT:  Head: Normocephalic and atraumatic.  Right Ear: External ear normal.  Left Ear: External ear normal.  Nose: Nose normal.  Mouth/Throat: Oropharynx is clear and moist.  Eyes: Conjunctivae and EOM are normal. Pupils are equal,  round, and reactive to light.  Neck: No JVD present. No tracheal deviation present. No thyromegaly present.  Cardiovascular: Normal rate, regular rhythm, normal heart sounds and intact distal pulses.  Exam reveals no gallop and no friction rub.   No murmur heard. Pulmonary/Chest: No respiratory distress. She has no wheezes. She has no rales.  Abdominal: She exhibits no distension and no mass. There is no tenderness.  Musculoskeletal: She exhibits no edema or tenderness.  Lymphadenopathy:    She has no cervical adenopathy.  Neurological: She is alert and oriented to person, place, and time. She has normal reflexes. No cranial nerve deficit. Coordination normal.  Skin: No rash noted. No erythema. No pallor.  Psychiatric: Her behavior is normal. Judgment and thought content normal.  Sad when describing her current life.    Labs reviewed: Lab Summary Latest Ref Rng 01/23/2015 11/25/2013 05/03/2013  Hemoglobin 13.0-17.0 g/dL (None) (None) (None)  Hematocrit 39.0-52.0 % (None) (None) (None)  White count - (None) (None) (None)  Platelet count - (None) (None) (None)  Sodium 136 - 144 mmol/L 141 142 139  Potassium 3.5 - 5.2 mmol/L 3.7 4.0 3.7  Calcium 8.7 - 10.3 mg/dL 8.8 9.2 8.8  Phosphorus - (None) (None) (None)  Creatinine 0.57 - 1.00 mg/dL 0.76 0.83 0.89  AST 0 - 40 IU/L 17 16 (None)  Alk Phos 39 - 117 IU/L 65 68 (None)  Bilirubin 0.0 - 1.2 mg/dL 0.5 0.5 (None)  Glucose 65 - 99 mg/dL 90 93 96  Cholesterol - (None) (None) (None)  HDL cholesterol >39 mg/dL 54 65 68  Triglycerides 0 - 149 mg/dL 59 40 38  LDL Direct - (None) (None) (None)  LDL Calc 0 - 99 mg/dL 66 73 68  Total protein - (None) (None) (None)  Albumin 3.5 - 4.8 g/dL 3.7 4.0 (None)   No results found for: TSH, T3TOTAL, T4TOTAL, THYROIDAB Lab Results  Component Value Date   BUN 12 01/23/2015   No results found for: HGBA1C  Assessment/Plan  1. Hyperlipidemia - simvastatin (ZOCOR) 40 MG tablet; Take 1 tablet (40 mg  total) by mouth daily.  Dispense: 90 tablet; Refill: 3  2. Insomnia Continue mirtazapine  3. Essential hypertension - losartan (COZAAR) 100 MG tablet; One daily to control BP  Dispense: 90 tablet; Refill: 3 - furosemide (LASIX) 40 MG tablet; TAKE 1 TABLET (40 MG TOTAL) BY MOUTH DAILY.  Dispense: 90 tablet; Refill: 3 - cloNIDine (CATAPRES) 0.1 MG tablet; TAKE 1 TABLET TWICE A DAY TO CONTROL BLOOD PRESSURE  Dispense: 180 tablet; Refill: 3  4. Anxiety state Continue clorazepate  5. Edema, unspecified type Improved on furosemide  6. Depression with anxiety - mirtazapine (REMERON) 30 MG tablet; TAKE 1 TABLET  BY MOUTH AT BEDTIME TO HELP NERVES AND TO REST  Dispense: 90 tablet; Refill: 3 - clorazepate (TRANXENE) 7.5 MG tablet; Take one tablet by mouth twice daily for anxiety  Dispense: 180 tablet; Refill: 3  7. Recurrent major depressive disorder, in partial remission (HCC) Continue mirtazapine  8. Cervicalgia Tylenol or Aleve as needed - metaxalone (SKELAXIN) 800 MG tablet; TAKE 1 TABLET BY MOUTH 3 TIMES A DAY TO RELAX MUSCLES  Dispense: 100 tablet; Refill: 4  9. Slow transit constipation - hyoscyamine (LEVBID) 0.375 MG 12 hr tablet; Take 1 capsule once daily to reduce stomach cramps as needed.  Dispense: 60 tablet; Refill: 5 - senna-docusate (SENOKOT-S) 8.6-50 MG tablet; One nightly to prevent constipation  Dispense: 30 tablet; Refill: 5

## 2015-02-09 ENCOUNTER — Other Ambulatory Visit: Payer: Self-pay | Admitting: Internal Medicine

## 2015-02-22 ENCOUNTER — Ambulatory Visit
Admission: RE | Admit: 2015-02-22 | Discharge: 2015-02-22 | Disposition: A | Discharge status: Home / Self Care | Payer: Medicare HMO | Source: Ambulatory Visit

## 2015-02-22 DIAGNOSIS — Z1231 Encounter for screening mammogram for malignant neoplasm of breast: Secondary | ICD-10-CM

## 2015-07-25 ENCOUNTER — Ambulatory Visit (INDEPENDENT_AMBULATORY_CARE_PROVIDER_SITE_OTHER): Payer: Medicare HMO | Admitting: Internal Medicine

## 2015-07-25 ENCOUNTER — Encounter: Payer: Self-pay | Admitting: Internal Medicine

## 2015-07-25 VITALS — BP 120/68 | HR 71 | Temp 97.9°F | Ht 61.0 in | Wt 156.0 lb

## 2015-07-25 DIAGNOSIS — Z1211 Encounter for screening for malignant neoplasm of colon: Secondary | ICD-10-CM | POA: Diagnosis not present

## 2015-07-25 DIAGNOSIS — I10 Essential (primary) hypertension: Secondary | ICD-10-CM

## 2015-07-25 DIAGNOSIS — F418 Other specified anxiety disorders: Secondary | ICD-10-CM

## 2015-07-25 DIAGNOSIS — R69 Illness, unspecified: Secondary | ICD-10-CM | POA: Diagnosis not present

## 2015-07-25 DIAGNOSIS — E785 Hyperlipidemia, unspecified: Secondary | ICD-10-CM | POA: Diagnosis not present

## 2015-07-25 DIAGNOSIS — F3341 Major depressive disorder, recurrent, in partial remission: Secondary | ICD-10-CM

## 2015-07-25 MED ORDER — FUROSEMIDE 40 MG PO TABS: 40.0000 mg | ORAL_TABLET | Freq: Every day | ORAL | Status: DC

## 2015-07-25 MED ORDER — CLORAZEPATE DIPOTASSIUM 7.5 MG PO TABS: ORAL_TABLET | ORAL | Status: DC

## 2015-07-25 MED ORDER — MIRTAZAPINE 30 MG PO TABS: ORAL_TABLET | ORAL | Status: DC

## 2015-07-25 MED ORDER — SIMVASTATIN 40 MG PO TABS: ORAL_TABLET | ORAL | Status: DC

## 2015-07-25 MED ORDER — CLONIDINE HCL 0.1 MG PO TABS: ORAL_TABLET | ORAL | Status: DC

## 2015-07-25 MED ORDER — SIMVASTATIN 40 MG PO TABS: 40.0000 mg | ORAL_TABLET | Freq: Every day | ORAL | Status: DC

## 2015-07-25 MED ORDER — LOSARTAN POTASSIUM 100 MG PO TABS: ORAL_TABLET | ORAL | Status: DC

## 2015-07-25 NOTE — Progress Notes (Signed)
Patient ID: Felicia Sawyer, female   DOB: 09/08/1940, 75 y.o.   MRN: [id number]    Facility  [facility]    Place of Service:   OFFICE    Allergies  Allergen Reactions  . Demerol [Meperidine]   . Venlafaxine Hcl Other (See Comments)    Hyper  . Pseudoephedrine Palpitations    Chief Complaint  Patient presents with  . Medical Management of Chronic Issues    6 month medication management blood pressure, anxiety, cholesterol     HPI:  Essential hypertension - controlled  Depression with anxiety -stable on: clorazepate (TRANXENE) 7.5 MG tablet, mirtazapine (REMERON) 30 MG tablet  Hyperlipidemia - stable  Recurrent major depressive disorder, in partial remission ([place]) - improved  Special screening for malignant neoplasms, colon - refuses colonoscopy    Medications: Patient's Medications  New Prescriptions   No medications on file  Previous Medications   ASPIRIN 81 MG TABLET    Take 81 mg by mouth daily. Take 1 tablet once daily.   FEXOFENADINE (HM FEXOFENADINE HCL) 180 MG TABLET    Take 180 mg by mouth daily. Take 1 tablet daily as needed for allergies.   FLUZONE HIGH-DOSE 0.5 ML SUSY    TO BE ADMINISTERED BY PHARMACIST FOR IMMUNIZATION   GARLIC PO    Take by mouth. Take 1 tablet once daily.   HYOSCYAMINE (LEVBID) 0.375 MG 12 HR TABLET    Take 1 capsule once daily to reduce stomach cramps as needed.   KRILL OIL PO    Take by mouth daily.   METAXALONE (SKELAXIN) 800 MG TABLET    TAKE 1 TABLET BY MOUTH 3 TIMES A DAY TO RELAX MUSCLES   MIRTAZAPINE (REMERON) 30 MG TABLET    TAKE 1 TABLET BY MOUTH AT BEDTIME TO HELP NERVES AND TO REST   SENNA-DOCUSATE (SENOKOT-S) 8.6-50 MG TABLET    One nightly to prevent constipation   SIMVASTATIN (ZOCOR) 40 MG TABLET    Take 1 tablet (40 mg total) by mouth daily.   VITAMIN E 400 UNIT CAPSULE    Take 400 Units by mouth daily.  Modified Medications   Modified Medication Previous Medication   CLONIDINE (CATAPRES) 0.1 MG TABLET cloNIDine  (CATAPRES) 0.1 MG tablet      TAKE 1 TABLET TWICE A DAY TO CONTROL BLOOD PRESSURE    TAKE 1 TABLET TWICE A DAY TO CONTROL BLOOD PRESSURE   CLORAZEPATE (TRANXENE) 7.5 MG TABLET clorazepate (TRANXENE) 7.5 MG tablet      Take one tablet by mouth twice daily for anxiety    Take one tablet by mouth twice daily for anxiety   FUROSEMIDE (LASIX) 40 MG TABLET furosemide (LASIX) 40 MG tablet      Take 1 tablet (40 mg total) by mouth daily.    TAKE 1 TABLET (40 MG TOTAL) BY MOUTH DAILY.   LOSARTAN (COZAAR) 100 MG TABLET losartan (COZAAR) 100 MG tablet      One daily to control BP    One daily to control BP  Discontinued Medications   CITALOPRAM (CELEXA) 20 MG TABLET    Take 1 tablet (20 mg total) by mouth daily.   COENZYME Q10 (COQ-10 PO)    Take by mouth daily.   FUROSEMIDE (LASIX) 40 MG TABLET    TAKE 1 TABLET (40 MG TOTAL) BY MOUTH DAILY.    Review of Systems  Constitutional: Negative for fever, chills, diaphoresis, activity change, appetite change and fatigue.  HENT: Negative.   Eyes: Positive for visual  disturbance (corrective lenses).  Respiratory:       Continues to smoke about 1/2 PPD  Cardiovascular: Positive for leg swelling. Negative for chest pain and palpitations.  Gastrointestinal: Positive for constipation (firm stools). Negative for nausea, abdominal pain, diarrhea and abdominal distention.  Endocrine: Negative for cold intolerance, heat intolerance, polydipsia and polyphagia.  Genitourinary: Negative.   Musculoskeletal: Positive for back pain and neck pain.  Skin: Negative.   Allergic/Immunologic: Negative.   Hematological: Negative.   Psychiatric/Behavioral:       Depression has improved and she is sleeping better.    Filed Vitals:   07/25/15 1331  BP: 120/68  Pulse: 71  Temp: 97.9 F (36.6 C)  TempSrc: Oral  Height: _0  (1.549 m)  Weight: 156 lb (70.761 kg)  SpO2: 94%   Body mass index is 29.49 kg/(m^2). Filed Weights   07/25/15 1331  Weight: 156 lb (70.761 kg)      Physical Exam  Constitutional: She is oriented to person, place, and time. She appears well-developed and well-nourished. No distress.  HENT:  Head: Normocephalic and atraumatic.  Right Ear: External ear normal.  Left Ear: External ear normal.  Nose: Nose normal.  Mouth/Throat: Oropharynx is clear and moist.  Eyes: Conjunctivae and EOM are normal. Pupils are equal, round, and reactive to light.  Neck: No JVD present. No tracheal deviation present. No thyromegaly present.  Cardiovascular: Normal rate, regular rhythm, normal heart sounds and intact distal pulses.  Exam reveals no gallop and no friction rub.   No murmur heard. Pulmonary/Chest: No respiratory distress. She has no wheezes. She has no rales.  Abdominal: She exhibits no distension and no mass. There is no tenderness.  Musculoskeletal: She exhibits no edema or tenderness.  Lymphadenopathy:    She has no cervical adenopathy.  Neurological: She is alert and oriented to person, place, and time. She has normal reflexes. No cranial nerve deficit. Coordination normal.  Skin: No rash noted. No erythema. No pallor.  Psychiatric: Her behavior is normal. Judgment and thought content normal.  Sad when describing her current life.    Labs reviewed: Lab Summary Latest Ref Rng 01/23/2015 11/25/2013 05/03/2013  Hemoglobin 13.0-17.0 g/dL (None) (None) (None)  Hematocrit 39.0-52.0 % (None) (None) (None)  White count - (None) (None) (None)  Platelet count - (None) (None) (None)  Sodium 136 - 144 mmol/L 141 142 139  Potassium 3.5 - 5.2 mmol/L 3.7 4.0 3.7  Calcium 8.7 - 10.3 mg/dL 8.8 9.2 8.8  Phosphorus - (None) (None) (None)  Creatinine 0.57 - 1.00 mg/dL 0.76 0.83 0.89  AST 0 - 40 IU/L 17 16 (None)  Alk Phos 39 - 117 IU/L 65 68 (None)  Bilirubin 0.0 - 1.2 mg/dL 0.5 0.5 (None)  Glucose 65 - 99 mg/dL 90 93 96  Cholesterol - (None) (None) (None)  HDL cholesterol >39 mg/dL 54 65 68  Triglycerides 0 - 149 mg/dL 59 40 38  LDL Direct -  (None) (None) (None)  LDL Calc 0 - 99 mg/dL 66 73 68  Total protein - (None) (None) (None)  Albumin 3.5 - 4.8 g/dL 3.7 4.0 (None)   No results found for: TSH, T3TOTAL, T4TOTAL, THYROIDAB Lab Results  Component Value Date   BUN 12 01/23/2015   BUN 16 11/25/2013   BUN 16 05/03/2013   No results found for: HGBA1C  Assessment/Plan  1. Essential hypertension Continue current meds - cloNIDine (CATAPRES) 0.1 MG tablet; TAKE 1 TABLET TWICE A DAY TO CONTROL BLOOD PRESSURE  Dispense: 180 tablet;  Refill: 3 - losartan (COZAAR) 100 MG tablet; One daily to control BP  Dispense: 90 tablet; Refill: 3 - Comprehensive metabolic panel; Future  2. Depression with anxiety - clorazepate (TRANXENE) 7.5 MG tablet; Take one tablet by mouth twice daily for anxiety  Dispense: 180 tablet; Refill: 3 - mirtazapine (REMERON) 30 MG tablet; TAKE 1 TABLET BY MOUTH AT BEDTIME TO HELP NERVES AND TO REST  Dispense: 90 tablet; Refill: 3  3. Hyperlipidemia - Lipid panel; Future - simvastatin (ZOCOR) 40 MG tablet; One daily to lower cholesterol  Dispense: 90 tablet; Refill: 3  4. Recurrent major depressive disorder, in partial remission ([place]) Continue mirtazipine  5. Special screening for malignant neoplasms, colon - Cologuard

## 2015-08-02 DIAGNOSIS — Z1211 Encounter for screening for malignant neoplasm of colon: Secondary | ICD-10-CM | POA: Diagnosis not present

## 2015-08-02 DIAGNOSIS — Z1212 Encounter for screening for malignant neoplasm of rectum: Secondary | ICD-10-CM | POA: Diagnosis not present

## 2015-08-02 NOTE — Addendum Note (Signed)
Addended by: Ripley Fraise on: 08/02/2015 10:49 AM   Modules accepted: Orders

## 2015-08-03 LAB — COLOGUARD: Cologuard: POSITIVE

## 2015-08-16 ENCOUNTER — Encounter: Payer: Self-pay | Admitting: *Deleted

## 2015-08-18 ENCOUNTER — Telehealth: Payer: Self-pay | Admitting: *Deleted

## 2015-08-18 DIAGNOSIS — Z1211 Encounter for screening for malignant neoplasm of colon: Secondary | ICD-10-CM

## 2015-08-18 NOTE — Telephone Encounter (Signed)
Spoke with patient and advised results, she is ok with the referral for colonoscopy, referral placed

## 2015-08-18 NOTE — Telephone Encounter (Signed)
Left message on machine to have patient return my call.  Per note from Dr. Mariea Clonts (covering for Dr. Nyoka Cowden) pt had a positive cologuard, this means a full colonoscopy is recommended.

## 2015-08-21 ENCOUNTER — Encounter: Payer: Self-pay | Admitting: Gastroenterology

## 2015-09-01 ENCOUNTER — Other Ambulatory Visit: Payer: Self-pay | Admitting: Internal Medicine

## 2015-10-11 ENCOUNTER — Encounter (INDEPENDENT_AMBULATORY_CARE_PROVIDER_SITE_OTHER): Payer: Self-pay

## 2015-10-11 ENCOUNTER — Ambulatory Visit (AMBULATORY_SURGERY_CENTER): Payer: Self-pay | Admitting: *Deleted

## 2015-10-11 VITALS — Ht 61.0 in | Wt 156.0 lb

## 2015-10-11 DIAGNOSIS — R195 Other fecal abnormalities: Secondary | ICD-10-CM

## 2015-10-11 MED ORDER — NA SULFATE-K SULFATE-MG SULF 17.5-3.13-1.6 GM/177ML PO SOLN: 1.0000 | Freq: Once | ORAL | 0 refills | Status: AC

## 2015-10-11 NOTE — Progress Notes (Signed)
No egg or soy allergy known to patient  No issues with past sedation with any surgeries  or procedures, no intubation problems  No diet pills per patient No home 02 use per patient  No blood thinners per patient  Pt denies issues with constipation - she states occ issue and uses senokot prn only

## 2015-10-13 ENCOUNTER — Encounter: Payer: Self-pay | Admitting: Gastroenterology

## 2015-10-16 ENCOUNTER — Encounter: Payer: Self-pay | Admitting: Internal Medicine

## 2015-10-25 ENCOUNTER — Encounter: Payer: Self-pay | Admitting: Gastroenterology

## 2015-10-25 ENCOUNTER — Ambulatory Visit (AMBULATORY_SURGERY_CENTER): Payer: Medicare HMO | Admitting: Gastroenterology

## 2015-10-25 VITALS — BP 160/84 | HR 71 | Temp 98.0°F | Resp 16 | Ht 61.0 in | Wt 156.0 lb

## 2015-10-25 DIAGNOSIS — D124 Benign neoplasm of descending colon: Secondary | ICD-10-CM | POA: Diagnosis not present

## 2015-10-25 DIAGNOSIS — K5900 Constipation, unspecified: Secondary | ICD-10-CM | POA: Diagnosis not present

## 2015-10-25 DIAGNOSIS — R195 Other fecal abnormalities: Secondary | ICD-10-CM | POA: Diagnosis present

## 2015-10-25 DIAGNOSIS — I10 Essential (primary) hypertension: Secondary | ICD-10-CM | POA: Diagnosis not present

## 2015-10-25 DIAGNOSIS — D128 Benign neoplasm of rectum: Secondary | ICD-10-CM

## 2015-10-25 DIAGNOSIS — D123 Benign neoplasm of transverse colon: Secondary | ICD-10-CM | POA: Diagnosis not present

## 2015-10-25 DIAGNOSIS — D127 Benign neoplasm of rectosigmoid junction: Secondary | ICD-10-CM | POA: Diagnosis not present

## 2015-10-25 DIAGNOSIS — D125 Benign neoplasm of sigmoid colon: Secondary | ICD-10-CM

## 2015-10-25 DIAGNOSIS — Z1211 Encounter for screening for malignant neoplasm of colon: Secondary | ICD-10-CM | POA: Diagnosis not present

## 2015-10-25 DIAGNOSIS — R69 Illness, unspecified: Secondary | ICD-10-CM | POA: Diagnosis not present

## 2015-10-25 LAB — HM COLONOSCOPY

## 2015-10-25 MED ORDER — SODIUM CHLORIDE 0.9 % IV SOLN: 500.0000 mL | INTRAVENOUS | Status: DC

## 2015-10-25 NOTE — Progress Notes (Signed)
To patent aw reprot to rn

## 2015-10-25 NOTE — Progress Notes (Signed)
Called to room to assist during endoscopic procedure.  Patient ID and intended procedure confirmed with present staff. Received instructions for my participation in the procedure from the performing physician.  

## 2015-10-25 NOTE — Op Note (Signed)
[facility] Patient Name: Felicia Sawyer Procedure Date: 10/25/2015 9:06 AM MRN: [id number] Endoscopist: Remo Lipps P. Havery Moros , MD Age: 75 Referring MD:  Date of Birth: 11/22/1940 Gender: Female Account #: [id number] Procedure:                Colonoscopy Indications:              This is the patient's first colonoscopy, Positive                            Cologuard test Medicines:                Monitored Anesthesia Care Procedure:                Pre-Anesthesia Assessment:                           - Prior to the procedure, a History and Physical                            was performed, and patient medications and                            allergies were reviewed. The patient's tolerance of                            previous anesthesia was also reviewed. The risks                            and benefits of the procedure and the sedation                            options and risks were discussed with the patient.                            All questions were answered, and informed consent                            was obtained. Prior Anticoagulants: The patient has                            taken aspirin, last dose was 1 day prior to                            procedure. ASA Grade Assessment: III - A patient                            with severe systemic disease. After reviewing the                            risks and benefits, the patient was deemed in                            satisfactory condition to undergo the procedure.  After obtaining informed consent, the colonoscope                            was passed under direct vision. Throughout the                            procedure, the patient's blood pressure, pulse, and                            oxygen saturations were monitored continuously. The                            Model PCF-H190L [id number]) scope was introduced                            through the anus and advanced to  the the cecum,                            identified by appendiceal orifice and ileocecal                            valve. The colonoscopy was technically difficult                            and complex due to significant looping. The patient                            tolerated the procedure well. The quality of the                            bowel preparation was adequate. The ileocecal                            valve, appendiceal orifice, and rectum were                            photographed. Scope In: 9:15:51 AM Scope Out: 10:00:16 AM Scope Withdrawal Time: 0 hours 35 minutes 40 seconds  Total Procedure Duration: 0 hours 44 minutes 25 seconds  Findings:                 The perianal and digital rectal examinations were                            normal.                           A 6 mm polyp was found in the rectum. The polyp was                            semi-pedunculated. The polyp was removed with a hot                            snare. Resection and retrieval were complete.  A 4 mm polyp was found in the recto-sigmoid colon.                            The polyp was sessile. The polyp was removed with a                            cold snare. Resection and retrieval were complete.                           A 15 mm polyp was found in the sigmoid colon. The                            polyp was pedunculated. The polyp was removed with                            a hot snare. Resection and retrieval were complete.                           Two sessile polyps were found in the sigmoid colon.                            The polyps were 5 to 7 mm in size. These polyps                            were removed with a cold snare. Resection and                            retrieval were complete.                           A 5 mm polyp was found in the descending colon. The                            polyp was sessile. The polyp was removed with a                             cold snare. Resection and retrieval were complete.                           A 12 mm polyp was found in the splenic flexure. The                            polyp was pedunculated. The polyp was removed with                            a hot snare. Resection and retrieval were complete.                           A 10 mm polyp was found in the transverse colon.  The polyp was semi-pedunculated. The polyp was                            removed with a hot snare. Resection and retrieval                            were complete.                           A 3 mm polyp was found in the transverse colon. The                            polyp was sessile. The polyp was removed with a                            cold biopsy forceps. Resection and retrieval were                            complete.                           A 5 mm polyp was found in the transverse colon. The                            polyp was sessile. The polyp was removed with a                            cold snare. Resection and retrieval were complete.                           Multiple small-mouthed diverticula were found in                            the left colon.                           Non-bleeding internal hemorrhoids were found during                            retroflexion.                           The exam was otherwise without abnormality. Complications:            No immediate complications. Estimated blood loss:                            Minimal. Estimated Blood Loss:     Estimated blood loss was minimal. Impression:               - One 6 mm polyp in the rectum, removed with a hot                            snare. Resected and retrieved.                           -  One 4 mm polyp at the recto-sigmoid colon,                            removed with a cold snare. Resected and retrieved.                           - One 15 mm polyp in the sigmoid colon, removed                             with a hot snare. Resected and retrieved.                           - Two 5 to 7 mm polyps in the sigmoid colon,                            removed with a cold snare. Resected and retrieved.                           - One 5 mm polyp in the descending colon, removed                            with a cold snare. Resected and retrieved.                           - One 12 mm polyp at the splenic flexure, removed                            with a hot snare. Resected and retrieved.                           - One 10 mm polyp in the transverse colon, removed                            with a hot snare. Resected and retrieved.                           - One 3 mm polyp in the transverse colon, removed                            with a cold biopsy forceps. Resected and retrieved.                           - One 5 mm polyp in the transverse colon, removed                            with a cold snare. Resected and retrieved.                           - Diverticulosis in the left colon.                           - Non-bleeding internal hemorrhoids.                           -  The examination was otherwise normal. Recommendation:           - Patient has a contact number available for                            emergencies. The signs and symptoms of potential                            delayed complications were discussed with the                            patient. Return to normal activities tomorrow.                            Written discharge instructions were provided to the                            patient.                           - Resume previous diet.                           - Continue present medications.                           - No aspirin, ibuprofen, naproxen, or other                            non-steroidal anti-inflammatory drugs for 2 weeks                            after polyp removal.                           - Await pathology results.                           - Repeat  colonoscopy is recommended for                            surveillance. The colonoscopy date will be                            determined after pathology results from today's                            exam become available for review. Remo Lipps P. Havery Moros, MD 10/25/2015 10:08:54 AM This report has been signed electronically.

## 2015-10-25 NOTE — Progress Notes (Signed)
No problems noted in the recovery room. Maw  Pt is hIPPA.  Dr. Havery Moros and myself only spoke with the pt.  Papers sealed in envelope.  maw

## 2015-10-25 NOTE — Patient Instructions (Addendum)
YOU HAD AN ENDOSCOPIC PROCEDURE TODAY AT [facility]:   Refer to the procedure report that was given to you for any specific questions about what was found during the examination.  If the procedure report does not answer your questions, please call your gastroenterologist to clarify.  If you requested that your care partner not be given the details of your procedure findings, then the procedure report has been included in a sealed envelope for you to review at your convenience later.  YOU SHOULD EXPECT: Some feelings of bloating in the abdomen. Passage of more gas than usual.  Walking can help get rid of the air that was put into your GI tract during the procedure and reduce the bloating. If you had a lower endoscopy (such as a colonoscopy or flexible sigmoidoscopy) you may notice spotting of blood in your stool or on the toilet paper. If you underwent a bowel prep for your procedure, you may not have a normal bowel movement for a few days.  Please Note:  You might notice some irritation and congestion in your nose or some drainage.  This is from the oxygen used during your procedure.  There is no need for concern and it should clear up in a day or so.  SYMPTOMS TO REPORT IMMEDIATELY:   Following lower endoscopy (colonoscopy or flexible sigmoidoscopy):  Excessive amounts of blood in the stool  Significant tenderness or worsening of abdominal pains  Swelling of the abdomen that is new, acute  Fever of 100F or higher   Following upper endoscopy (EGD)  Vomiting of blood or coffee ground material  New chest pain or pain under the shoulder blades  Painful or persistently difficult swallowing  New shortness of breath  Fever of 100F or higher  Black, tarry-looking stools  For urgent or emergent issues, a gastroenterologist can be reached at any hour by calling [phone].   DIET:  We do recommend a small meal at first, but then you may proceed to your regular diet.  Drink  plenty of fluids but you should avoid alcoholic beverages for 24 hours.  ACTIVITY:  You should plan to take it easy for the rest of today and you should NOT DRIVE or use heavy machinery until tomorrow (because of the sedation medicines used during the test).    FOLLOW UP: Our staff will call the number listed on your records the next business day following your procedure to check on you and address any questions or concerns that you may have regarding the information given to you following your procedure. If we do not reach you, we will leave a message.  However, if you are feeling well and you are not experiencing any problems, there is no need to return our call.  We will assume that you have returned to your regular daily activities without incident.  If any biopsies were taken you will be contacted by phone or by letter within the next 1-3 weeks.  Please call us at [phone] if you have not heard about the biopsies in 3 weeks.    SIGNATURES/CONFIDENTIALITY: You and/or your care partner have signed paperwork which will be entered into your electronic medical record.  These signatures attest to the fact that that the information above on your After Visit Summary has been reviewed and is understood.  Full responsibility of the confidentiality of this discharge information lies with you and/or your care-partner.    Handouts were given to your care partner on polyps,  diverticulosis, and hemorrhoids. No aspirin, aspirin products,  ibuprofen, naproxen, advil, motrin, aleve, or other non-steroidal anti-inflammatory drugs for 14 days after polyp removal.  If you need pain medication, tylenol is ok. You may resume your other current medications today including taking your aspirin 81 mg. Await biopsy results. Please call if any questions or concerns.

## 2015-10-26 ENCOUNTER — Telehealth: Payer: Self-pay

## 2015-10-26 ENCOUNTER — Encounter: Payer: Self-pay | Admitting: Internal Medicine

## 2015-10-26 NOTE — Telephone Encounter (Signed)
  Follow up Call-  Call back number 10/25/2015  Post procedure Call Back phone  # [phone]  Permission to leave phone message Yes  Some recent data might be hidden     Patient questions:  Do you have a fever, pain , or abdominal swelling? No. Pain Score  0 *  Have you tolerated food without any problems? Yes.    Have you been able to return to your normal activities? Yes.    Do you have any questions about your discharge instructions: Diet   No. Medications  No. Follow up visit  No.  Do you have questions or concerns about your Care? No.  Actions: * If pain score is 4 or above: No action needed, pain <4.

## 2015-11-02 ENCOUNTER — Encounter: Payer: Self-pay | Admitting: Gastroenterology

## 2015-11-07 DIAGNOSIS — H25813 Combined forms of age-related cataract, bilateral: Secondary | ICD-10-CM | POA: Diagnosis not present

## 2015-11-07 DIAGNOSIS — E7800 Pure hypercholesterolemia, unspecified: Secondary | ICD-10-CM | POA: Diagnosis not present

## 2015-11-07 DIAGNOSIS — H5200 Hypermetropia, unspecified eye: Secondary | ICD-10-CM | POA: Diagnosis not present

## 2015-11-07 DIAGNOSIS — Z0100 Encounter for examination of eyes and vision without abnormal findings: Secondary | ICD-10-CM | POA: Diagnosis not present

## 2015-11-07 DIAGNOSIS — I10 Essential (primary) hypertension: Secondary | ICD-10-CM | POA: Diagnosis not present

## 2015-12-01 ENCOUNTER — Other Ambulatory Visit: Payer: Self-pay

## 2015-12-01 DIAGNOSIS — E785 Hyperlipidemia, unspecified: Secondary | ICD-10-CM

## 2015-12-01 DIAGNOSIS — I10 Essential (primary) hypertension: Secondary | ICD-10-CM

## 2015-12-05 DIAGNOSIS — Z23 Encounter for immunization: Secondary | ICD-10-CM | POA: Diagnosis not present

## 2016-01-26 ENCOUNTER — Other Ambulatory Visit: Payer: Medicare HMO

## 2016-01-26 ENCOUNTER — Other Ambulatory Visit: Payer: Self-pay | Admitting: Internal Medicine

## 2016-01-26 DIAGNOSIS — E785 Hyperlipidemia, unspecified: Secondary | ICD-10-CM

## 2016-01-26 DIAGNOSIS — Z1231 Encounter for screening mammogram for malignant neoplasm of breast: Secondary | ICD-10-CM

## 2016-01-26 DIAGNOSIS — I10 Essential (primary) hypertension: Secondary | ICD-10-CM | POA: Diagnosis not present

## 2016-01-26 LAB — COMPLETE METABOLIC PANEL WITH GFR
ALT: 11 U/L (ref 6–29)
AST: 17 U/L (ref 10–35)
Albumin: 3.6 g/dL (ref 3.6–5.1)
Alkaline Phosphatase: 59 U/L (ref 33–130)
BUN: 11 mg/dL (ref 7–25)
CO2: 29 mmol/L (ref 20–31)
Calcium: 8.8 mg/dL (ref 8.6–10.4)
Chloride: 101 mmol/L (ref 98–110)
Creat: 0.75 mg/dL (ref 0.60–0.93)
GFR, Est African American: >89 mL/min (ref >=60)
GFR, Est Non African American: 79 mL/min (ref >=60)
Glucose, Bld: 85 mg/dL (ref 65–99)
Potassium: 4.2 mmol/L (ref 3.5–5.3)
Sodium: 138 mmol/L (ref 135–146)
Total Bilirubin: 0.5 mg/dL (ref 0.2–1.2)
Total Protein: 6.5 g/dL (ref 6.1–8.1)

## 2016-01-26 LAB — LIPID PANEL
Cholesterol: 132 mg/dL (ref <200)
HDL: 55 mg/dL (ref >50)
LDL Cholesterol: 66 mg/dL (ref <100)
Total CHOL/HDL Ratio: 2.4 Ratio (ref <5.0)
Triglycerides: 53 mg/dL (ref <150)
VLDL: 11 mg/dL (ref <30)

## 2016-01-30 ENCOUNTER — Encounter: Payer: Medicare HMO | Admitting: Internal Medicine

## 2016-02-06 ENCOUNTER — Encounter: Payer: Self-pay | Admitting: Internal Medicine

## 2016-02-06 ENCOUNTER — Ambulatory Visit (INDEPENDENT_AMBULATORY_CARE_PROVIDER_SITE_OTHER): Payer: Medicare HMO | Admitting: Internal Medicine

## 2016-02-06 VITALS — BP 120/64 | HR 79 | Temp 97.9°F | Ht 61.0 in | Wt 156.0 lb

## 2016-02-06 DIAGNOSIS — R69 Illness, unspecified: Secondary | ICD-10-CM | POA: Diagnosis not present

## 2016-02-06 DIAGNOSIS — K5901 Slow transit constipation: Secondary | ICD-10-CM

## 2016-02-06 DIAGNOSIS — I10 Essential (primary) hypertension: Secondary | ICD-10-CM

## 2016-02-06 DIAGNOSIS — F3341 Major depressive disorder, recurrent, in partial remission: Secondary | ICD-10-CM

## 2016-02-06 DIAGNOSIS — F418 Other specified anxiety disorders: Secondary | ICD-10-CM

## 2016-02-06 DIAGNOSIS — M542 Cervicalgia: Secondary | ICD-10-CM | POA: Diagnosis not present

## 2016-02-06 MED ORDER — SIMVASTATIN 40 MG PO TABS: ORAL_TABLET | ORAL | 3 refills | Status: DC

## 2016-02-06 MED ORDER — CLONIDINE HCL 0.1 MG PO TABS: ORAL_TABLET | ORAL | 3 refills | Status: DC

## 2016-02-06 MED ORDER — MIRTAZAPINE 30 MG PO TABS: ORAL_TABLET | ORAL | 3 refills | Status: DC

## 2016-02-06 MED ORDER — CLORAZEPATE DIPOTASSIUM 7.5 MG PO TABS: ORAL_TABLET | ORAL | 3 refills | Status: DC

## 2016-02-06 MED ORDER — FUROSEMIDE 40 MG PO TABS: 40.0000 mg | ORAL_TABLET | Freq: Every day | ORAL | 3 refills | Status: DC

## 2016-02-06 MED ORDER — METAXALONE 800 MG PO TABS: ORAL_TABLET | ORAL | 4 refills | Status: DC

## 2016-02-06 MED ORDER — LOSARTAN POTASSIUM 100 MG PO TABS: ORAL_TABLET | ORAL | 3 refills | Status: DC

## 2016-02-06 NOTE — Progress Notes (Signed)
HISTORY AND PHYSICAL  Location:    [facility]   Place of Service:   OFFICE  Extended Emergency Contact Information Primary Emergency Contact: Wade,Janice Address: [place], [place] Mobile Phone: [phone] Relation: None  Advanced Directive information Does Patient Have a Medical Advance Directive?: No  Chief Complaint  Patient presents with  . Annual Exam    Physical exam  . Medical Management of Chronic Issues    medication management blood pressure, depression, cholesterol  . MMSE    30/30 passed clock drawing    HPI:  Essential hypertension - Plan: EKG 12-Lead, losartan (COZAAR) 100 MG tablet, cloNIDine (CATAPRES) 0.1 MG tablet  Depression with anxiety - Plan: mirtazapine (REMERON) 30 MG tablet, clorazepate (TRANXENE) 7.5 MG tablet  Cervicalgia - Plan: metaxalone (SKELAXIN) 800 MG tablet  Slow transit constipation  Recurrent major depressive disorder, in partial remission ([place])    Past Medical History:  Diagnosis Date  . Abnormality of gait 07/11/2005  . Allergy   . Anxiety state, unspecified 04/08/2012  . Asymptomatic varicose veins 09/24/2011  . Cataract    bilateral  . Chronic venous insufficiency 09/16/2012  . Degenerative and vascular disorders of ear, unspecified 08/25/2002  . Depressive disorder, not elsewhere classified 11/10/2002  . Diverticulosis of colon (without mention of hemorrhage) 08/25/1989  . Edema 09/24/2011  . Esophageal reflux 11/10/2002   pt denies reflux  . First degree atrioventricular block 07/19/2008  . Hypertension   . Inguinal hernia without mention of obstruction or gangrene, unilateral or unspecified, (not specified as recurrent) 11/28/2006  . Insomnia, unspecified 12/15/2007  . Leiomyoma of uterus, unspecified 05/27/2006  . Leukocytopenia, unspecified 08/28/2010  . Lumbago 07/22/2007  . Obesity, unspecified 05/26/1996  . Osteoarthrosis, unspecified whether generalized  or localized, unspecified site 05/26/2004  . Other abnormal blood chemistry 07/19/2008  . Other and unspecified hyperlipidemia 10/24/2004  . Palpitations 08/29/2007  . Peptic ulcer, unspecified site, unspecified as acute or chronic, without mention of hemorrhage, perforation, or obstruction 05/27/2006  . Scoliosis (and kyphoscoliosis), idiopathic 05/23/2009  . Symptomatic menopausal or female climacteric states 05/28/1990  . Unspecified essential hypertension 11/13/2004    Past Surgical History:  Procedure Laterality Date  . CHOLECYSTECTOMY  1971  . ENDOMETRIAL BIOPSY  03/1992   Dr. Mallie Mussel  . FOOT SURGERY Left 04/2003   [facility]- Dr. Caffie Pinto  . INGUINAL HERNIA REPAIR Right 03/2007   Dr. Georgette Dover  . KNEE ARTHROSCOPY Left 07/1997   Dr. Wonda Olds  . KNEE SURGERY Right 03/02/2004   Dr. Berenice Primas  . OOPHORECTOMY Right 1974   Dr. Amalia Hailey  . THUMB ARTHROSCOPY Right 1995   Dr.Sypher  . TONSILLECTOMY AND ADENOIDECTOMY  1975  . TUBAL LIGATION    . VESICOVAGINAL FISTULA CLOSURE W/ TAH  1975    Patient Care Team: Estill Dooms, MD as PCP - General (Internal Medicine)  Social History   Social History  . Marital status: Widowed    Spouse name: N/A  . Number of children: N/A  . Years of education: N/A   Occupational History  . Not on file.   Social History Main Topics  . Smoking status: Current Every Day Smoker    Packs/day: 0.25    Years: 60.00    Types: Cigarettes  . Smokeless tobacco: Never Used     Comment: 5 a day   . Alcohol use No  . Drug use: No  . Sexual activity: Not on  file   Other Topics Concern  . Not on file   Social History Narrative   Widowed 2013   Smokes about 1/2 pack a week   Alcohol none   Exercise 5 days a week, treadmill, dance class, zumba class       reports that she has been smoking Cigarettes.  She has a 15.00 pack-year smoking history. She has never used smokeless tobacco. She reports that she does not drink alcohol or use drugs.  Family  History  Problem Relation Age of Onset  . Kidney disease Mother   . Hypertension Mother   . Heart disease Father   . Diabetes Father   . Hypertension Sister   . Colon cancer Neg Hx   . Colon polyps Neg Hx   . Rectal cancer Neg Hx   . Stomach cancer Neg Hx   . Esophageal cancer Neg Hx    Family Status  Relation Status  . Mother Deceased  . Father Deceased   MI, DM  . Sister Alive  . Daughter Alive  . Son Deceased at age 27  . Sister Deceased  . Sister Deceased  . Sister Deceased  . Daughter Alive  . Neg Hx     Immunization History  Administered Date(s) Administered  . Influenza-Unspecified 11/29/2009, 12/02/2012, 11/27/2013, 11/29/2014, 11/26/2015  . Pneumococcal Conjugate-13 04/16/2002  . Td 01/16/1993  . Tdap 03/06/2011  . Zoster 11/25/2013    Allergies  Allergen Reactions  . Caffeine Other (See Comments)    Rapid heart rate   . Demerol [Meperidine]   . Venlafaxine Hcl Other (See Comments)    Hyper  . Pseudoephedrine Palpitations    Medications: Patient's Medications  New Prescriptions   No medications on file  Previous Medications   ASPIRIN 81 MG TABLET    Take 81 mg by mouth daily. Take 1 tablet once daily.   CHOLECALCIFEROL (VITAMIN D) 1000 UNITS TABLET    Take 1,000 Units by mouth daily.   FEXOFENADINE (HM FEXOFENADINE HCL) 180 MG TABLET    Take 180 mg by mouth daily. Take 1 tablet daily as needed for allergies.   FLUZONE HIGH-DOSE 0.5 ML SUSY    TO BE ADMINISTERED BY PHARMACIST FOR IMMUNIZATION   GARLIC PO    Take by mouth. Take 1 tablet once daily.   HYOSCYAMINE (LEVBID) 0.375 MG 12 HR TABLET    Take 1 capsule once daily to reduce stomach cramps as needed.   KRILL OIL PO    Take by mouth daily.   SENNA-DOCUSATE (SENOKOT-S) 8.6-50 MG TABLET    One nightly to prevent constipation  Modified Medications   Modified Medication Previous Medication   CLONIDINE (CATAPRES) 0.1 MG TABLET cloNIDine (CATAPRES) 0.1 MG tablet      TAKE 1 TABLET TWICE A DAY TO  CONTROL BLOOD PRESSURE    TAKE 1 TABLET TWICE A DAY TO CONTROL BLOOD PRESSURE   CLORAZEPATE (TRANXENE) 7.5 MG TABLET clorazepate (TRANXENE) 7.5 MG tablet      Take one tablet by mouth twice daily for anxiety    Take one tablet by mouth twice daily for anxiety   FUROSEMIDE (LASIX) 40 MG TABLET furosemide (LASIX) 40 MG tablet      Take 1 tablet (40 mg total) by mouth daily.    Take 1 tablet (40 mg total) by mouth daily.   LOSARTAN (COZAAR) 100 MG TABLET losartan (COZAAR) 100 MG tablet      One daily to control BP    One daily to  control BP   METAXALONE (SKELAXIN) 800 MG TABLET metaxalone (SKELAXIN) 800 MG tablet      TAKE 1 TABLET BY MOUTH 3 TIMES A DAY TO RELAX MUSCLES    TAKE 1 TABLET BY MOUTH 3 TIMES A DAY TO RELAX MUSCLES   MIRTAZAPINE (REMERON) 30 MG TABLET mirtazapine (REMERON) 30 MG tablet      TAKE 1 TABLET BY MOUTH AT BEDTIME TO HELP NERVES AND TO REST    TAKE 1 TABLET BY MOUTH AT BEDTIME TO HELP NERVES AND TO REST   SIMVASTATIN (ZOCOR) 40 MG TABLET simvastatin (ZOCOR) 40 MG tablet      One daily to lower cholesterol    One daily to lower cholesterol  Discontinued Medications   FUROSEMIDE (LASIX) 40 MG TABLET    TAKE 1 TABLET (40 MG TOTAL) BY MOUTH DAILY.    Review of Systems  Constitutional: Negative for activity change, appetite change, chills, diaphoresis, fatigue, fever and unexpected weight change.  HENT: Negative.  Negative for congestion, ear discharge, ear pain, hearing loss, postnasal drip, rhinorrhea, sore throat, tinnitus, trouble swallowing and voice change.   Eyes: Positive for visual disturbance (corrective lenses). Negative for pain, redness and itching.  Respiratory: Negative for cough, choking, shortness of breath and wheezing.        Continues to smoke about 1/2 PPD  Cardiovascular: Positive for leg swelling. Negative for chest pain and palpitations.  Gastrointestinal: Positive for constipation (firm stools). Negative for abdominal distention, abdominal pain, diarrhea  and nausea.  Endocrine: Negative for cold intolerance, heat intolerance, polydipsia, polyphagia and polyuria.  Genitourinary: Negative.  Negative for difficulty urinating, dysuria, flank pain, frequency, hematuria, pelvic pain, urgency and vaginal discharge.  Musculoskeletal: Positive for back pain and neck pain. Negative for arthralgias, gait problem, myalgias and neck stiffness.  Skin: Negative.  Negative for color change, pallor and rash.  Allergic/Immunologic: Negative.   Neurological: Negative for dizziness, tremors, seizures, syncope, weakness, numbness and headaches.  Hematological: Negative.  Negative for adenopathy. Does not bruise/bleed easily.  Psychiatric/Behavioral: Negative for agitation, behavioral problems, confusion, dysphoric mood, hallucinations, sleep disturbance and suicidal ideas. The patient is not nervous/anxious and is not hyperactive.        Depression has improved and she is sleeping better.    Vitals:   02/06/16 1456  BP: 120/64  Pulse: 79  Temp: 97.9 F (36.6 C)  TempSrc: Oral  SpO2: 96%  Weight: 156 lb (70.8 kg)  Height: 5' 1" (1.549 m)   Body mass index is 29.48 kg/m. Filed Weights   02/06/16 1456  Weight: 156 lb (70.8 kg)     Physical Exam  Constitutional: She is oriented to person, place, and time. She appears well-developed and well-nourished. No distress.  HENT:  Head: Normocephalic and atraumatic.  Right Ear: External ear normal.  Left Ear: External ear normal.  Nose: Nose normal.  Mouth/Throat: Oropharynx is clear and moist. No oropharyngeal exudate.  Eyes: Conjunctivae and EOM are normal. Pupils are equal, round, and reactive to light. No scleral icterus.  Neck: No JVD present. No tracheal deviation present. No thyromegaly present.  Cardiovascular: Normal rate, regular rhythm, normal heart sounds and intact distal pulses.  Exam reveals no gallop and no friction rub.   No murmur heard. Pulmonary/Chest: Effort normal. No respiratory  distress. She has no wheezes. She has no rales. She exhibits no tenderness.  Abdominal: She exhibits no distension and no mass. There is no tenderness.  Musculoskeletal: Normal range of motion. She exhibits no edema or tenderness.  Lymphadenopathy:    She has no cervical adenopathy.  Neurological: She is alert and oriented to person, place, and time. She has normal reflexes. No cranial nerve deficit. Coordination normal.  12/112/17 MMSE 30/30. Passed clock drawing.  Skin: No rash noted. She is not diaphoretic. No erythema. No pallor.  Psychiatric: She has a normal mood and affect. Her behavior is normal. Judgment and thought content normal.  Sad when describing her current life.    Labs reviewed: Lab Summary Latest Ref Rng & Units 01/26/2016 01/23/2015 11/25/2013  Hemoglobin 13.0-17.0 g/dL (None) (None) (None)  Hematocrit 39.0-52.0 % (None) (None) (None)  White count - (None) (None) (None)  Platelet count - (None) (None) (None)  Sodium 135 - 146 mmol/L 138 141 142  Potassium 3.5 - 5.3 mmol/L 4.2 3.7 4.0  Calcium 8.6 - 10.4 mg/dL 8.8 8.8 9.2  Phosphorus - (None) (None) (None)  Creatinine 0.60 - 0.93 mg/dL 0.75 0.76 0.83  AST 10 - 35 U/L _0 Alk Phos 33 - 130 U/L 59 65 68  Bilirubin 0.2 - 1.2 mg/dL 0.5 0.5 0.5  Glucose 65 - 99 mg/dL 85 90 93  Cholesterol <200 mg/dL 132 (None) (None)  HDL cholesterol >50 mg/dL 55 54 65  Triglycerides <150 mg/dL 53 59 40  LDL Direct - (None) (None) (None)  LDL Calc <100 mg/dL 66 66 73  Total protein 6.1 - 8.1 g/dL 6.5 (None) (None)  Albumin 3.6 - 5.1 g/dL 3.6 3.7 4.0  Some recent data might be hidden   Lab Results  Component Value Date   BUN 11 01/26/2016   12/112/17 EKG: rate 72. NSR. Normal.  Assessment/Plan

## 2016-02-15 ENCOUNTER — Encounter: Payer: Medicare HMO | Admitting: Internal Medicine

## 2016-03-01 ENCOUNTER — Ambulatory Visit
Admission: RE | Admit: 2016-03-01 | Discharge: 2016-03-01 | Disposition: A | Discharge status: Home / Self Care | Payer: Medicare HMO | Source: Ambulatory Visit | Attending: Internal Medicine | Admitting: Internal Medicine

## 2016-03-01 DIAGNOSIS — Z1231 Encounter for screening mammogram for malignant neoplasm of breast: Secondary | ICD-10-CM | POA: Diagnosis not present

## 2016-03-06 ENCOUNTER — Telehealth: Payer: Self-pay | Admitting: *Deleted

## 2016-03-06 NOTE — Telephone Encounter (Signed)
See if they will cover cyclobenzaprine 10 mg up to 3 times daily to relax muscles. Disp 90 with 5 refills.

## 2016-03-06 NOTE — Telephone Encounter (Signed)
Patient called and stated that her insurance will no longer cover her Metaxalone 800mg  and she would like something else called in. Please Advise.

## 2016-03-07 DIAGNOSIS — G8929 Other chronic pain: Secondary | ICD-10-CM | POA: Diagnosis not present

## 2016-03-07 DIAGNOSIS — J309 Allergic rhinitis, unspecified: Secondary | ICD-10-CM | POA: Diagnosis not present

## 2016-03-07 DIAGNOSIS — F419 Anxiety disorder, unspecified: Secondary | ICD-10-CM | POA: Diagnosis not present

## 2016-03-07 DIAGNOSIS — K2970 Gastritis, unspecified, without bleeding: Secondary | ICD-10-CM | POA: Diagnosis not present

## 2016-03-07 DIAGNOSIS — I10 Essential (primary) hypertension: Secondary | ICD-10-CM | POA: Diagnosis not present

## 2016-03-07 DIAGNOSIS — K5900 Constipation, unspecified: Secondary | ICD-10-CM | POA: Diagnosis not present

## 2016-03-07 DIAGNOSIS — G4700 Insomnia, unspecified: Secondary | ICD-10-CM | POA: Diagnosis not present

## 2016-03-07 DIAGNOSIS — R69 Illness, unspecified: Secondary | ICD-10-CM | POA: Diagnosis not present

## 2016-03-07 DIAGNOSIS — R609 Edema, unspecified: Secondary | ICD-10-CM | POA: Diagnosis not present

## 2016-03-07 DIAGNOSIS — E785 Hyperlipidemia, unspecified: Secondary | ICD-10-CM | POA: Diagnosis not present

## 2016-03-07 MED ORDER — CYCLOBENZAPRINE HCL 10 MG PO TABS: ORAL_TABLET | ORAL | 5 refills | Status: DC

## 2016-03-07 NOTE — Telephone Encounter (Signed)
Patient notified and agreed. Medication list updated and Rx faxed to pharmacy.  

## 2016-03-11 ENCOUNTER — Telehealth: Payer: Self-pay | Admitting: *Deleted

## 2016-03-11 MED ORDER — METHOCARBAMOL 500 MG PO TABS: ORAL_TABLET | ORAL | 0 refills | Status: DC

## 2016-03-11 NOTE — Telephone Encounter (Signed)
Received fax from [facility] [phone] stating patient is requesting a Rx for Methocarbamol 750mg  because Cyclobenzaprine and Metaxalone are too expensive. States this will be covered. Please Advise.

## 2016-03-11 NOTE — Telephone Encounter (Signed)
Rx: Methocarbamol 500 mg (90) One up ti 3 times in 24 hours to help muscle spasm

## 2016-03-11 NOTE — Telephone Encounter (Signed)
Medication list updated. Rx faxed to pharmacy.  

## 2016-03-27 ENCOUNTER — Telehealth: Payer: Self-pay | Admitting: Internal Medicine

## 2016-03-27 NOTE — Telephone Encounter (Signed)
left msg asking pt to confirm this AWV appt w/ nurse. VDM (DD) °

## 2016-04-02 ENCOUNTER — Encounter: Payer: Self-pay | Admitting: Internal Medicine

## 2016-04-03 ENCOUNTER — Ambulatory Visit: Payer: Medicare HMO

## 2016-05-12 ENCOUNTER — Other Ambulatory Visit: Payer: Self-pay | Admitting: Internal Medicine

## 2016-06-19 ENCOUNTER — Ambulatory Visit (INDEPENDENT_AMBULATORY_CARE_PROVIDER_SITE_OTHER): Payer: Medicare HMO | Admitting: Podiatry

## 2016-06-19 ENCOUNTER — Encounter: Payer: Self-pay | Admitting: Podiatry

## 2016-06-19 VITALS — Ht 61.0 in | Wt 156.0 lb

## 2016-06-19 DIAGNOSIS — M2010 Hallux valgus (acquired), unspecified foot: Secondary | ICD-10-CM | POA: Diagnosis not present

## 2016-06-19 DIAGNOSIS — L97511 Non-pressure chronic ulcer of other part of right foot limited to breakdown of skin: Secondary | ICD-10-CM | POA: Diagnosis not present

## 2016-06-19 DIAGNOSIS — I739 Peripheral vascular disease, unspecified: Secondary | ICD-10-CM

## 2016-06-19 DIAGNOSIS — B351 Tinea unguium: Secondary | ICD-10-CM

## 2016-06-19 NOTE — Progress Notes (Signed)
SUBJECTIVE: 76 y.o. year old female presents complaining of bad painful right big toe duration of about 2 months. Patient had right great toe wrapped with band aid and tape. Hurts all the time. She used to wear toe spacer but got broke off.  Patient points lateral aspect of the right great toe that is pressing against the 2nd toe. She was seen at this office many years ago for ingrown nail and neuroma pain.  REVIEW OF SYSTEMS: Pertinent items noted in HPI and remainder of comprehensive ROS otherwise negative.  OBJECTIVE: DERMATOLOGIC EXAMINATION: Nails:Thick dystrophic nails x 10. Pre ulcerative digital lesion right great toe lateral aspect of IPJ hallux, symptomatic.   VASCULAR EXAMINATION OF LOWER LIMBS: Pedal pulses are not palpable both DP and PT bilateral.  Mild forefoot edema R>L.  Temperature gradient from tibial crest to dorsum of foot is within normal bilateral.  NEUROLOGIC EXAMINATION OF THE LOWER LIMBS: All epicritic and tactile sensations grossly intact. Sharp and Dull discriminatory sensations at the plantar ball of hallux is intact bilateral.   MUSCULOSKELETAL EXAMINATION: Positive for Hallux valgus with bunion formation bilateral. Enlarged condyle IPJ lateral aspect right hallux with painful lesion.  ASSESSMENT: Ulcer toe right great toe. Onychomycosis x 10. Pain in right great toe. PVD.  PLAN: Reviewed clinical findings and available treatment options. Pre ulcerative lesion debrided. All nails debrided. Toe separator placed in between the first and 2nd toe right. Will follow up on the lesion in one month.

## 2016-06-19 NOTE — Patient Instructions (Signed)
Seen for pain in right great toe. Noted of pre ulcerative lesion on right great toe at contact surface, thick dystrophic nails x 10. Lesion debrided and spacer placed. Keep the spacer while in closed in shoes. All nails debrided. Return in 1 month.

## 2016-07-02 ENCOUNTER — Encounter: Payer: Self-pay | Admitting: Internal Medicine

## 2016-07-02 ENCOUNTER — Ambulatory Visit (INDEPENDENT_AMBULATORY_CARE_PROVIDER_SITE_OTHER): Payer: Medicare HMO | Admitting: Internal Medicine

## 2016-07-02 VITALS — BP 112/66 | HR 72 | Temp 97.7°F | Ht 61.0 in | Wt 163.0 lb

## 2016-07-02 DIAGNOSIS — I341 Nonrheumatic mitral (valve) prolapse: Secondary | ICD-10-CM | POA: Diagnosis not present

## 2016-07-02 DIAGNOSIS — F418 Other specified anxiety disorders: Secondary | ICD-10-CM

## 2016-07-02 DIAGNOSIS — I10 Essential (primary) hypertension: Secondary | ICD-10-CM | POA: Diagnosis not present

## 2016-07-02 DIAGNOSIS — E785 Hyperlipidemia, unspecified: Secondary | ICD-10-CM | POA: Diagnosis not present

## 2016-07-02 DIAGNOSIS — R69 Illness, unspecified: Secondary | ICD-10-CM | POA: Diagnosis not present

## 2016-07-02 MED ORDER — METHOCARBAMOL 500 MG PO TABS: ORAL_TABLET | ORAL | 0 refills | Status: DC

## 2016-07-02 MED ORDER — FUROSEMIDE 40 MG PO TABS: 40.0000 mg | ORAL_TABLET | Freq: Every day | ORAL | 3 refills | Status: DC

## 2016-07-02 MED ORDER — SIMVASTATIN 40 MG PO TABS: ORAL_TABLET | ORAL | 3 refills | Status: DC

## 2016-07-02 MED ORDER — MIRTAZAPINE 30 MG PO TABS
ORAL_TABLET | ORAL | 3 refills | Status: DC
Start: 2016-07-02 — End: 2016-11-04

## 2016-07-02 MED ORDER — CLORAZEPATE DIPOTASSIUM 7.5 MG PO TABS: ORAL_TABLET | ORAL | 3 refills | Status: DC

## 2016-07-02 MED ORDER — LOSARTAN POTASSIUM 100 MG PO TABS: ORAL_TABLET | ORAL | 3 refills | Status: DC

## 2016-07-02 NOTE — Progress Notes (Signed)
Facility  [facility]    Place of Service:   OFFICE    Allergies  Allergen Reactions  . Caffeine Other (See Comments)    Rapid heart rate   . Demerol [Meperidine]   . Venlafaxine Hcl Other (See Comments)    Hyper  . Pseudoephedrine Palpitations    Chief Complaint  Patient presents with  . Medical Management of Chronic Issues    5 month medication management blood pressure, depression, cholesterol    HPI:   Hyperlipidemia, unspecified hyperlipidemia type  Depression with anxiety - doing well on present medications.  Essential hypertension - controlled on present medicataions  Mitral prolapse - heart murmur noted when she had a call at home by nurse or physician. They startled her by emphasizing danger of a heart murmur. She denies palpitations or chest pain.    Medications: Patient's Medications  New Prescriptions   No medications on file  Previous Medications   ASPIRIN 81 MG TABLET    Take 81 mg by mouth daily. Take 1 tablet once daily.   CHOLECALCIFEROL (VITAMIN D) 1000 UNITS TABLET    Take 1,000 Units by mouth daily.   CLONIDINE (CATAPRES) 0.1 MG TABLET    TAKE 1 TABLET TWICE A DAY TO CONTROL BLOOD PRESSURE   CLORAZEPATE (TRANXENE) 7.5 MG TABLET    Take one tablet by mouth twice daily for anxiety   FEXOFENADINE (HM FEXOFENADINE HCL) 180 MG TABLET    Take 180 mg by mouth daily. Take 1 tablet daily as needed for allergies.   FLUZONE HIGH-DOSE 0.5 ML SUSY    TO BE ADMINISTERED BY PHARMACIST FOR IMMUNIZATION   FUROSEMIDE (LASIX) 40 MG TABLET    Take 1 tablet (40 mg total) by mouth daily.   GARLIC PO    Take by mouth. Take 1 tablet once daily.   HYOSCYAMINE (LEVBID) 0.375 MG 12 HR TABLET    Take 1 capsule once daily to reduce stomach cramps as needed.   KRILL OIL PO    Take by mouth daily.   LOSARTAN (COZAAR) 100 MG TABLET    One daily to control BP   METHOCARBAMOL (ROBAXIN) 500 MG TABLET    TAKE ONE TABLET BY MOUTH UP TO THREE TIMES DAILY IN 24 HOURS TO HELP MUSCLE SPASM    MIRTAZAPINE (REMERON) 30 MG TABLET    TAKE 1 TABLET BY MOUTH AT BEDTIME TO HELP NERVES AND TO REST   SENNA-DOCUSATE (SENOKOT-S) 8.6-50 MG TABLET    One nightly to prevent constipation   SIMVASTATIN (ZOCOR) 40 MG TABLET    One daily to lower cholesterol  Modified Medications   No medications on file  Discontinued Medications   No medications on file    Review of Systems  Constitutional: Negative for activity change, appetite change, chills, diaphoresis, fatigue, fever and unexpected weight change.  HENT: Negative.  Negative for congestion, ear discharge, ear pain, hearing loss, postnasal drip, rhinorrhea, sore throat, tinnitus, trouble swallowing and voice change.   Eyes: Positive for visual disturbance (corrective lenses). Negative for pain, redness and itching.  Respiratory: Negative for cough, choking, shortness of breath and wheezing.        Continues to smoke about 1/2 PPD  Cardiovascular: Positive for leg swelling. Negative for chest pain and palpitations.  Gastrointestinal: Positive for constipation (firm stools). Negative for abdominal distention, abdominal pain, diarrhea and nausea.  Endocrine: Negative for cold intolerance, heat intolerance, polydipsia, polyphagia and polyuria.  Genitourinary: Negative.  Negative for difficulty urinating, dysuria, flank pain, frequency,  hematuria, pelvic pain, urgency and vaginal discharge.  Musculoskeletal: Positive for back pain and neck pain. Negative for arthralgias, gait problem, myalgias and neck stiffness.  Skin: Negative.  Negative for color change, pallor and rash.  Allergic/Immunologic: Negative.   Neurological: Negative for dizziness, tremors, seizures, syncope, weakness, numbness and headaches.  Hematological: Negative.  Negative for adenopathy. Does not bruise/bleed easily.  Psychiatric/Behavioral: Negative for agitation, behavioral problems, confusion, dysphoric mood, hallucinations, sleep disturbance and suicidal ideas. The patient  is not nervous/anxious and is not hyperactive.        Depression has improved and she is sleeping better.    Vitals:   07/02/16 1446  BP: 112/66  Pulse: 72  Temp: 97.7 F (36.5 C)  TempSrc: Oral  SpO2: 96%  Weight: 163 lb (73.9 kg)  Height: 5' 1" (1.549 m)   Body mass index is 30.8 kg/m. Wt Readings from Last 3 Encounters:  07/02/16 163 lb (73.9 kg)  06/19/16 156 lb (70.8 kg)  02/06/16 156 lb (70.8 kg)      Physical Exam  Constitutional: She is oriented to person, place, and time. She appears well-developed and well-nourished. No distress.  HENT:  Head: Normocephalic and atraumatic.  Right Ear: External ear normal.  Left Ear: External ear normal.  Nose: Nose normal.  Mouth/Throat: Oropharynx is clear and moist. No oropharyngeal exudate.  Eyes: Conjunctivae and EOM are normal. Pupils are equal, round, and reactive to light. No scleral icterus.  Neck: No JVD present. No tracheal deviation present. No thyromegaly present.  Cardiovascular: Normal rate, regular rhythm and intact distal pulses.  Exam reveals no gallop and no friction rub.   No murmur heard. Systolic murmur.2/6 LSB   Pulmonary/Chest: Effort normal. No respiratory distress. She has no wheezes. She has no rales. She exhibits no tenderness.  Abdominal: She exhibits no distension and no mass. There is no tenderness.  Musculoskeletal: Normal range of motion. She exhibits no edema or tenderness.  Lymphadenopathy:    She has no cervical adenopathy.  Neurological: She is alert and oriented to person, place, and time. She has normal reflexes. No cranial nerve deficit. Coordination normal.  12/112/17 MMSE 30/30. Passed clock drawing.  Skin: No rash noted. She is not diaphoretic. No erythema. No pallor.  Psychiatric: She has a normal mood and affect. Her behavior is normal. Judgment and thought content normal.  Sad when describing her current life.    Labs reviewed: Lab Summary Latest Ref Rng & Units 01/26/2016  01/23/2015  Hemoglobin 13.0-17.0 g/dL (None) (None)  Hematocrit 39.0-52.0 % (None) (None)  White count - (None) (None)  Platelet count - (None) (None)  Sodium 135 - 146 mmol/L 138 141  Potassium 3.5 - 5.3 mmol/L 4.2 3.7  Calcium 8.6 - 10.4 mg/dL 8.8 8.8  Phosphorus - (None) (None)  Creatinine 0.60 - 0.93 mg/dL 0.75 0.76  AST 10 - 35 U/L 17 17  Alk Phos 33 - 130 U/L 59 65  Bilirubin 0.2 - 1.2 mg/dL 0.5 0.5  Glucose 65 - 99 mg/dL 85 90  Cholesterol <200 mg/dL 132 (None)  HDL cholesterol >50 mg/dL 55 54  Triglycerides <150 mg/dL 53 59  LDL Direct - (None) (None)  LDL Calc <100 mg/dL 66 66  Total protein 6.1 - 8.1 g/dL 6.5 (None)  Albumin 3.6 - 5.1 g/dL 3.6 3.7  Some recent data might be hidden   No results found for: TSH, T3TOTAL, T4TOTAL, THYROIDAB Lab Results  Component Value Date   BUN 11 01/26/2016   BUN 12  01/23/2015   BUN 16 11/25/2013   No results found for: HGBA1C  Assessment/Plan  1. Depression with anxiety The current medical regimen is effective;  continue present plan and medications. - clorazepate (TRANXENE) 7.5 MG tablet; Take one tablet by mouth twice daily for anxiety  Dispense: 180 tablet; Refill: 3 - mirtazapine (REMERON) 30 MG tablet; TAKE 1 TABLET BY MOUTH AT BEDTIME TO HELP NERVES AND TO REST  Dispense: 90 tablet; Refill: 3  2. Essential hypertension The current medical regimen is effective;  continue present plan and medications. - losartan (COZAAR) 100 MG tablet; One daily to control BP  Dispense: 90 tablet; Refill: 3 - Comprehensive metabolic panel; Future  3. Mitral prolapse Advised patient this is unlikely to create any problems for her. Discussed echocardiogram and cardiology referral, but she is content to let us follow without referral at this time.  4. Hyperlipidemia, unspecified hyperlipidemia type The current medical regimen is effective;  continue present plan and medications. - Lipid panel; Future

## 2016-07-09 ENCOUNTER — Ambulatory Visit: Payer: Medicare HMO | Admitting: Internal Medicine

## 2016-07-18 ENCOUNTER — Ambulatory Visit (INDEPENDENT_AMBULATORY_CARE_PROVIDER_SITE_OTHER): Payer: Medicare HMO | Admitting: Podiatry

## 2016-07-18 DIAGNOSIS — M2010 Hallux valgus (acquired), unspecified foot: Secondary | ICD-10-CM

## 2016-07-18 DIAGNOSIS — I739 Peripheral vascular disease, unspecified: Secondary | ICD-10-CM | POA: Diagnosis not present

## 2016-07-18 DIAGNOSIS — L97511 Non-pressure chronic ulcer of other part of right foot limited to breakdown of skin: Secondary | ICD-10-CM

## 2016-07-18 NOTE — Patient Instructions (Signed)
Pre ulcerative lesion right great toe debrided and padded. Return as needed.

## 2016-07-19 ENCOUNTER — Encounter: Payer: Self-pay | Admitting: Podiatry

## 2016-07-19 NOTE — Progress Notes (Signed)
SUBJECTIVE: 76 y.o. year old female presents for one month follow up on painful right big toe Still hurts but not as bad as before.   OBJECTIVE: DERMATOLOGIC EXAMINATION: Callus build up over right great toe lateral aspect of IPJ hallux, symptomatic.   VASCULAR EXAMINATION OF LOWER LIMBS: Pedal pulses are not palpable both DP and PT bilateral.  Mild forefoot edema R>L.  Temperature gradient from tibial crest to dorsum of foot is within normal bilateral.  NEUROLOGIC EXAMINATION OF THE LOWER LIMBS: All epicritic and tactile sensations grossly intact. Sharp and Dull discriminatory sensations at the plantar ball of hallux is intact bilateral.   MUSCULOSKELETAL EXAMINATION: Positive for Hallux valgus with bunion formation bilateral. Enlarged condyle IPJ lateral aspect right hallux with painful lesion.  ASSESSMENT: Pre ulcerative digital corn right great toe. Pain in right great toe. PVD.  PLAN: Reviewed clinical findings and available treatment options. Pre ulcerative lesion debrided and padded.  Toe separator placed in between the first and 2nd toe right. Return in 3 months or sooner if pain comes back.

## 2016-08-21 NOTE — Addendum Note (Signed)
Addended by: Royann Shivers A on: 08/21/2016 03:25 PM   Modules accepted: Orders

## 2016-09-26 ENCOUNTER — Other Ambulatory Visit: Payer: Self-pay

## 2016-09-26 DIAGNOSIS — I10 Essential (primary) hypertension: Secondary | ICD-10-CM

## 2016-09-26 DIAGNOSIS — E785 Hyperlipidemia, unspecified: Secondary | ICD-10-CM

## 2016-10-31 ENCOUNTER — Other Ambulatory Visit: Payer: Medicare HMO

## 2016-10-31 DIAGNOSIS — I10 Essential (primary) hypertension: Secondary | ICD-10-CM

## 2016-10-31 DIAGNOSIS — E785 Hyperlipidemia, unspecified: Secondary | ICD-10-CM | POA: Diagnosis not present

## 2016-10-31 LAB — COMPLETE METABOLIC PANEL WITH GFR
AG Ratio: 1.5 (calc) (ref 1.0–2.5)
ALT: 11 U/L (ref 6–29)
AST: 15 U/L (ref 10–35)
Albumin: 3.8 g/dL (ref 3.6–5.1)
Alkaline phosphatase (APISO): 64 U/L (ref 33–130)
BUN: 9 mg/dL (ref 7–25)
CO2: 30 mmol/L (ref 20–32)
Calcium: 8.8 mg/dL (ref 8.6–10.4)
Chloride: 99 mmol/L (ref 98–110)
Creat: 0.69 mg/dL (ref 0.60–0.93)
GFR, Est African American: 99 mL/min/{1.73_m2} (ref >=60)
GFR, Est Non African American: 86 mL/min/{1.73_m2} (ref >=60)
Globulin: 2.6 g/dL (calc) (ref 1.9–3.7)
Glucose, Bld: 84 mg/dL (ref 65–99)
Potassium: 4.4 mmol/L (ref 3.5–5.3)
Sodium: 134 mmol/L — ABNORMAL LOW (ref 135–146)
Total Bilirubin: 0.6 mg/dL (ref 0.2–1.2)
Total Protein: 6.4 g/dL (ref 6.1–8.1)

## 2016-10-31 LAB — LIPID PANEL
Cholesterol: 130 mg/dL (ref <200)
HDL: 57 mg/dL (ref >50)
LDL Cholesterol (Calc): 58 mg/dL (calc)
Non-HDL Cholesterol (Calc): 73 mg/dL (calc) (ref <130)
Total CHOL/HDL Ratio: 2.3 (calc) (ref <5.0)
Triglycerides: 71 mg/dL (ref <150)

## 2016-11-04 ENCOUNTER — Encounter: Payer: Self-pay | Admitting: Internal Medicine

## 2016-11-04 ENCOUNTER — Ambulatory Visit (INDEPENDENT_AMBULATORY_CARE_PROVIDER_SITE_OTHER): Payer: Medicare HMO | Admitting: Internal Medicine

## 2016-11-04 VITALS — BP 128/78 | HR 82 | Temp 98.1°F | Wt 159.0 lb

## 2016-11-04 DIAGNOSIS — Z23 Encounter for immunization: Secondary | ICD-10-CM

## 2016-11-04 DIAGNOSIS — M545 Low back pain, unspecified: Secondary | ICD-10-CM

## 2016-11-04 DIAGNOSIS — R601 Generalized edema: Secondary | ICD-10-CM | POA: Diagnosis not present

## 2016-11-04 DIAGNOSIS — K5901 Slow transit constipation: Secondary | ICD-10-CM | POA: Diagnosis not present

## 2016-11-04 DIAGNOSIS — F411 Generalized anxiety disorder: Secondary | ICD-10-CM | POA: Diagnosis not present

## 2016-11-04 DIAGNOSIS — G8929 Other chronic pain: Secondary | ICD-10-CM | POA: Diagnosis not present

## 2016-11-04 DIAGNOSIS — I10 Essential (primary) hypertension: Secondary | ICD-10-CM | POA: Diagnosis not present

## 2016-11-04 DIAGNOSIS — I872 Venous insufficiency (chronic) (peripheral): Secondary | ICD-10-CM

## 2016-11-04 DIAGNOSIS — R69 Illness, unspecified: Secondary | ICD-10-CM | POA: Diagnosis not present

## 2016-11-04 NOTE — Progress Notes (Signed)
Location:   [place]   Place of Service:   Clinic  Provider: Lacey Dotson L. Mariea Clonts, D.O., C.M.D.  Code Status: Full Goals of Care:  Advanced Directives 11/04/2016  Does Patient Have a Medical Advance Directive? No  Would patient like information on creating a medical advance directive? No - Patient declined   Chief Complaint  Patient presents with  . Medical Management of Chronic Issues    55mth follow-up, transfer from Dr. Nyoka Cowden    HPI: Patient is a 76 y.o. female seen today for medical management of chronic diseases and transferring from Dr. Nyoka Cowden.    Osteoarthritis: Occasional knee pain but she uses ice and finds relief  Back Pain: Sees a chiropractor once a month for an adjustment and uses heat when she has an issues. Has methocarbamol prescription but says that it has been months since she has had to take any.   HTN: BP today 128/78 Managed on clonidine twice daily and furosemide daily. Losartan 100mg  daily.  Edema: Furosemide is working well and she uses compressions stockings with good results.   Anxiety: Not taking the mirtazipine anymore due to weight gain. Uses the tranxene twice daily. Sleeps well most nights.  Constipation: Has bowel movement about every other day. Would like something other than Sennakot-S.  Eats prunes and blueberries every day.  Is working on her hydration and goes to the gym 4 days per week.  Goal is 6000 steps per day for 40 minutes.  Does two days of zumba.    Wants to wait for flu shot--get at [facility] usually or will come back here in Oct.    Past Medical History:  Diagnosis Date  . Abnormality of gait 07/11/2005  . Allergy   . Anxiety state, unspecified 04/08/2012  . Asymptomatic varicose veins 09/24/2011  . Cataract    bilateral  . Chronic venous insufficiency 09/16/2012  . Degenerative and vascular disorders of ear, unspecified 08/25/2002  . Depressive disorder, not elsewhere classified 11/10/2002  . Diverticulosis of colon (without mention of  hemorrhage) 08/25/1989  . Edema 09/24/2011  . Esophageal reflux 11/10/2002   pt denies reflux  . First degree atrioventricular block 07/19/2008  . Hypertension   . Inguinal hernia without mention of obstruction or gangrene, unilateral or unspecified, (not specified as recurrent) 11/28/2006  . Insomnia, unspecified 12/15/2007  . Leiomyoma of uterus, unspecified 05/27/2006  . Leukocytopenia, unspecified 08/28/2010  . Lumbago 07/22/2007  . Obesity, unspecified 05/26/1996  . Osteoarthrosis, unspecified whether generalized or localized, unspecified site 05/26/2004  . Other abnormal blood chemistry 07/19/2008  . Other and unspecified hyperlipidemia 10/24/2004  . Palpitations 08/29/2007  . Peptic ulcer, unspecified site, unspecified as acute or chronic, without mention of hemorrhage, perforation, or obstruction 05/27/2006  . Scoliosis (and kyphoscoliosis), idiopathic 05/23/2009  . Symptomatic menopausal or female climacteric states 05/28/1990  . Unspecified essential hypertension 11/13/2004    Past Surgical History:  Procedure Laterality Date  . CHOLECYSTECTOMY  1971  . ENDOMETRIAL BIOPSY  03/1992   Dr. Mallie Mussel  . FOOT SURGERY Left 04/2003   [facility]- Dr. Caffie Pinto  . INGUINAL HERNIA REPAIR Right 03/2007   Dr. Georgette Dover  . KNEE ARTHROSCOPY Left 07/1997   Dr. Wonda Olds  . KNEE SURGERY Right 03/02/2004   Dr. Berenice Primas  . OOPHORECTOMY Right 1974   Dr. Amalia Hailey  . THUMB ARTHROSCOPY Right 1995   Dr.Sypher  . TONSILLECTOMY AND ADENOIDECTOMY  1975  . TUBAL LIGATION    . VESICOVAGINAL FISTULA CLOSURE W/ TAH  1975  Allergies  Allergen Reactions  . Caffeine Other (See Comments)    Rapid heart rate   . Demerol [Meperidine]   . Venlafaxine Hcl Other (See Comments)    Hyper  . Pseudoephedrine Palpitations    Allergies as of 11/04/2016      Reactions   Caffeine Other (See Comments)   Rapid heart rate    Demerol [meperidine]    Venlafaxine Hcl Other (See Comments)   Hyper    Pseudoephedrine Palpitations      Medication List       Accurate as of 11/04/16  3:16 PM. Always use your most recent med list.          aspirin 81 MG tablet Take 81 mg by mouth daily.   cholecalciferol 1000 units tablet Commonly known as:  VITAMIN D Take 1,000 Units by mouth daily.   cloNIDine 0.1 MG tablet Commonly known as:  CATAPRES TAKE 1 TABLET TWICE A DAY TO CONTROL BLOOD PRESSURE   clorazepate 7.5 MG tablet Commonly known as:  TRANXENE Take one tablet by mouth twice daily for anxiety   furosemide 40 MG tablet Commonly known as:  LASIX Take 1 tablet (40 mg total) by mouth daily.   GARLIC PO Take 1 tablet by mouth daily.   HM FEXOFENADINE HCL 180 MG tablet Generic drug:  fexofenadine Take 180 mg by mouth daily.   KRILL OIL PO Take by mouth daily.   losartan 100 MG tablet Commonly known as:  COZAAR Take 100 mg by mouth daily.   methocarbamol 500 MG tablet Commonly known as:  ROBAXIN TAKE ONE TABLET BY MOUTH UP TO THREE TIMES DAILY IN 24 HOURS TO HELP MUSCLE SPASM   mirtazapine 30 MG tablet Commonly known as:  REMERON TAKE 1 TABLET BY MOUTH AT BEDTIME TO HELP NERVES AND TO REST   senna-docusate 8.6-50 MG tablet Commonly known as:  Senokot-S One nightly to prevent constipation   simvastatin 40 MG tablet Commonly known as:  ZOCOR One daily to lower cholesterol       Review of Systems:  Review of Systems  Constitutional: Negative for malaise/fatigue and weight loss.  HENT: Negative.   Eyes: Negative for blurred vision and pain.       Has cataracts and is due for her appt  Respiratory: Negative for cough and shortness of breath.   Cardiovascular: Positive for leg swelling. Negative for chest pain and palpitations.  Gastrointestinal: Positive for constipation. Negative for blood in stool and melena.  Genitourinary: Negative.   Musculoskeletal: Positive for back pain and joint pain.  Skin: Negative.   Neurological: Negative for dizziness,  weakness and headaches.  Psychiatric/Behavioral: The patient is nervous/anxious. The patient does not have insomnia.     Health Maintenance  Topic Date Due  . PNA vac Low Risk Adult (2 of 2 - PPSV23) 05/16/2007  . INFLUENZA VACCINE  09/25/2016  . MAMMOGRAM  03/01/2018  . Fecal DNA (Cologuard)  08/03/2018  . TETANUS/TDAP  03/05/2021  . DEXA SCAN  Completed    Physical Exam: Vitals:   11/04/16 1506  BP: 128/78  Pulse: 82  Temp: 98.1 F (36.7 C)  TempSrc: Oral  SpO2: 95%  Weight: 159 lb (72.1 kg)   Body mass index is 30.04 kg/m. Physical Exam  Constitutional: She is oriented to person, place, and time. She appears well-developed and well-nourished.  Cardiovascular: Normal rate, regular rhythm and normal heart sounds.   Pulmonary/Chest: Effort normal. No respiratory distress. She has no wheezes. She has rales.  Abdominal: Bowel sounds are normal.  Musculoskeletal: She exhibits no edema.  Neurological: She is alert and oriented to person, place, and time.  Skin: Skin is warm and dry. Capillary refill takes less than 2 seconds.  Psychiatric: She has a normal mood and affect. Her behavior is normal. Judgment and thought content normal.    Labs reviewed: Basic Metabolic Panel:  Recent Labs  01/26/16 0911 10/31/16 0903  NA 138 134*  K 4.2 4.4  CL 101 99  CO2 29 30  GLUCOSE 85 84  BUN 11 9  CREATININE 0.75 0.69  CALCIUM 8.8 8.8   Liver Function Tests:  Recent Labs  01/26/16 0911 10/31/16 0903  AST 17 15  ALT 11 11  ALKPHOS 59  --   BILITOT 0.5 0.6  PROT 6.5 6.4  ALBUMIN 3.6  --    No results for input(s): LIPASE, AMYLASE in the last 8760 hours. No results for input(s): AMMONIA in the last 8760 hours. CBC: No results for input(s): WBC, NEUTROABS, HGB, HCT, MCV, PLT in the last 8760 hours. Lipid Panel:  Recent Labs  01/26/16 0911 10/31/16 0903  CHOL 132 130  HDL 55 57  LDLCALC 66  --   TRIG 53 71  CHOLHDL 2.4 2.3   Assessment/Plan 1. Essential  hypertension -bp at goal with current regimen, cont same and monitor - CBC with Differential/Platelet; Future - COMPLETE METABOLIC PANEL WITH GFR; Future  2. Chronic venous insufficiency -cont compression hose, low sodium diet and elevating feet at rest  3. Slow transit constipation -add miralax if senna, prunes, blueberries, fiber cereal, hydration and exercise are inadequate  4. Anxiety state -continues on tranxene which she's taken for years, says she's not taking remeron  5.Chronic low back pain without sciatica, unspecified back pain laterality -cont exercise program, rare robaxin use for muscle spasms  6. Need for vaccination with 13-polyvalent pneumococcal conjugate vaccine -prevnar given  Labs/tests ordered:   Orders Placed This Encounter  Procedures  . CBC with Differential/Platelet    Standing Status:   Future    Standing Expiration Date:   11/04/2017  . COMPLETE METABOLIC PANEL WITH GFR    Standing Status:   Future    Standing Expiration Date:   11/04/2017   Next appt:  6 mos for CPE, labs before  Felicia Sawyer L. Jadrian Bulman, D.O. [facility] [address], [place] [address] Cell Phone (Mon-Fri 8am-5pm):  [phone] On Call:  [phone] & follow prompts after 5pm & weekends Office Phone:  [phone] Office Fax:  [phone]

## 2016-11-04 NOTE — Patient Instructions (Signed)
Try miralax if you are still not having a bm as often as you like.

## 2016-11-26 DIAGNOSIS — R69 Illness, unspecified: Secondary | ICD-10-CM | POA: Diagnosis not present

## 2016-11-26 DIAGNOSIS — Z0100 Encounter for examination of eyes and vision without abnormal findings: Secondary | ICD-10-CM | POA: Diagnosis not present

## 2016-11-26 DIAGNOSIS — H5703 Miosis: Secondary | ICD-10-CM | POA: Diagnosis not present

## 2016-11-29 ENCOUNTER — Encounter: Payer: Self-pay | Admitting: Gastroenterology

## 2016-12-25 ENCOUNTER — Encounter: Payer: Self-pay | Admitting: Gastroenterology

## 2017-01-10 ENCOUNTER — Ambulatory Visit (AMBULATORY_SURGERY_CENTER): Payer: Self-pay | Admitting: *Deleted

## 2017-01-10 ENCOUNTER — Other Ambulatory Visit: Payer: Self-pay

## 2017-01-10 VITALS — Ht 61.0 in | Wt 160.2 lb

## 2017-01-10 DIAGNOSIS — D126 Benign neoplasm of colon, unspecified: Secondary | ICD-10-CM

## 2017-01-10 DIAGNOSIS — Z86010 Personal history of colonic polyps: Secondary | ICD-10-CM

## 2017-01-10 MED ORDER — NA SULFATE-K SULFATE-MG SULF 17.5-3.13-1.6 GM/177ML PO SOLN: 1.0000 [IU] | Freq: Once | ORAL | 0 refills | Status: AC

## 2017-01-10 NOTE — Progress Notes (Signed)
No egg or soy allergy known to patient  No issues with past sedation with any surgeries  or procedures, no intubation problems  No diet pills per patient No home 02 use per patient  No blood thinners per patient  Pt denies issues with constipation on and off taking miralax and eats prunes No A fib or A flutter  EMMI video sent to pt's e mail

## 2017-01-21 ENCOUNTER — Encounter: Payer: Self-pay | Admitting: Gastroenterology

## 2017-01-28 ENCOUNTER — Other Ambulatory Visit: Payer: Self-pay | Admitting: Internal Medicine

## 2017-01-28 DIAGNOSIS — F418 Other specified anxiety disorders: Secondary | ICD-10-CM

## 2017-01-28 NOTE — Telephone Encounter (Signed)
RX called in , patient aware

## 2017-01-31 ENCOUNTER — Other Ambulatory Visit: Payer: Self-pay

## 2017-01-31 ENCOUNTER — Encounter: Payer: Self-pay | Admitting: Gastroenterology

## 2017-01-31 ENCOUNTER — Ambulatory Visit (AMBULATORY_SURGERY_CENTER): Payer: Medicare HMO | Admitting: Gastroenterology

## 2017-01-31 VITALS — BP 164/78 | HR 71 | Temp 97.3°F | Resp 15 | Ht 61.0 in | Wt 159.0 lb

## 2017-01-31 DIAGNOSIS — D123 Benign neoplasm of transverse colon: Secondary | ICD-10-CM | POA: Diagnosis not present

## 2017-01-31 DIAGNOSIS — D126 Benign neoplasm of colon, unspecified: Secondary | ICD-10-CM

## 2017-01-31 DIAGNOSIS — D122 Benign neoplasm of ascending colon: Secondary | ICD-10-CM

## 2017-01-31 DIAGNOSIS — K635 Polyp of colon: Secondary | ICD-10-CM

## 2017-01-31 DIAGNOSIS — K621 Rectal polyp: Secondary | ICD-10-CM

## 2017-01-31 DIAGNOSIS — Z86010 Personal history of colonic polyps: Secondary | ICD-10-CM

## 2017-01-31 DIAGNOSIS — D124 Benign neoplasm of descending colon: Secondary | ICD-10-CM

## 2017-01-31 DIAGNOSIS — Z1211 Encounter for screening for malignant neoplasm of colon: Secondary | ICD-10-CM | POA: Diagnosis not present

## 2017-01-31 DIAGNOSIS — D128 Benign neoplasm of rectum: Secondary | ICD-10-CM

## 2017-01-31 MED ORDER — SODIUM CHLORIDE 0.9 % IV SOLN: 500.0000 mL | Freq: Once | INTRAVENOUS | Status: DC

## 2017-01-31 NOTE — Progress Notes (Signed)
Pt's states no medical or surgical changes since previsit or office visit. 

## 2017-01-31 NOTE — Progress Notes (Signed)
To PACU, VSS. Report to [email or web address] 

## 2017-01-31 NOTE — Patient Instructions (Signed)
YOU HAD AN ENDOSCOPIC PROCEDURE TODAY AT THE [facility]:   Refer to the procedure report that was given to you for any specific questions about what was found during the examination.  If the procedure report does not answer your questions, please call your gastroenterologist to clarify.  If you requested that your care partner not be given the details of your procedure findings, then the procedure report has been included in a sealed envelope for you to review at your convenience later.  YOU SHOULD EXPECT: Some feelings of bloating in the abdomen. Passage of more gas than usual.  Walking can help get rid of the air that was put into your GI tract during the procedure and reduce the bloating. If you had a lower endoscopy (such as a colonoscopy or flexible sigmoidoscopy) you may notice spotting of blood in your stool or on the toilet paper. If you underwent a bowel prep for your procedure, you may not have a normal bowel movement for a few days.  Please Note:  You might notice some irritation and congestion in your nose or some drainage.  This is from the oxygen used during your procedure.  There is no need for concern and it should clear up in a day or so.  SYMPTOMS TO REPORT IMMEDIATELY:   Following lower endoscopy (colonoscopy or flexible sigmoidoscopy):  Excessive amounts of blood in the stool  Significant tenderness or worsening of abdominal pains  Swelling of the abdomen that is new, acute  Fever of 100F or higher   For urgent or emergent issues, a gastroenterologist can be reached at any hour by calling [phone]. Please read all handouts given to you by your recovery nurse.   DIET:  We do recommend a small meal at first, but then you may proceed to your regular diet.  Drink plenty of fluids but you should avoid alcoholic beverages for 24 hours.  ACTIVITY:  You should plan to take it easy for the rest of today and you should NOT DRIVE or use heavy machinery until  tomorrow (because of the sedation medicines used during the test).    FOLLOW UP: Our staff will call the number listed on your records the next business day following your procedure to check on you and address any questions or concerns that you may have regarding the information given to you following your procedure. If we do not reach you, we will leave a message.  However, if you are feeling well and you are not experiencing any problems, there is no need to return our call.  We will assume that you have returned to your regular daily activities without incident.  If any biopsies were taken you will be contacted by phone or by letter within the next 1-3 weeks.  Please call us at [phone] if you have not heard about the biopsies in 3 weeks.    SIGNATURES/CONFIDENTIALITY: You and/or your care partner have signed paperwork which will be entered into your electronic medical record.  These signatures attest to the fact that that the information above on your After Visit Summary has been reviewed and is understood.  Full responsibility of the confidentiality of this discharge information lies with you and/or your care-partner.  Thank you for letting us take care of your healthcare needs today. 

## 2017-01-31 NOTE — Op Note (Signed)
Felicia Sawyer Patient Name: Felicia Sawyer Procedure Date: 01/31/2017 2:23 PM MRN: [id number] Endoscopist: Remo Lipps P. Mariska Daffin MD, MD Age: 76 Referring MD:  Date of Birth: 04-Jul-1940 Gender: Female Account #: [id number] Procedure:                Colonoscopy Indications:              Surveillance: History of numerous (> 10) adenomas                            on last colonoscopy 1 year ago, one of which had                            high grade dysplasia Medicines:                Monitored Anesthesia Care Procedure:                Pre-Anesthesia Assessment:                           - Prior to the procedure, a History and Physical                            was performed, and patient medications and                            allergies were reviewed. The patient's tolerance of                            previous anesthesia was also reviewed. The risks                            and benefits of the procedure and the sedation                            options and risks were discussed with the patient.                            All questions were answered, and informed consent                            was obtained. Prior Anticoagulants: The patient has                            taken no previous anticoagulant or antiplatelet                            agents. ASA Grade Assessment: III - A patient with                            severe systemic disease. After reviewing the risks                            and benefits, the patient was deemed in  satisfactory condition to undergo the procedure.                           After obtaining informed consent, the colonoscope                            was passed under direct vision. Throughout the                            procedure, the patient's blood pressure, pulse, and                            oxygen saturations were monitored continuously. The                            Colonoscope was  introduced through the anus and                            advanced to the the cecum, identified by                            appendiceal orifice and ileocecal valve. The                            colonoscopy was performed without difficulty. The                            patient tolerated the procedure well. The quality                            of the bowel preparation was adequate. The                            ileocecal valve, appendiceal orifice, and rectum                            were photographed. Scope In: 2:28:28 PM Scope Out: 2:56:50 PM Scope Withdrawal Time: 0 hours 19 minutes 52 seconds  Total Procedure Duration: 0 hours 28 minutes 22 seconds  Findings:                 The perianal and digital rectal examinations were                            normal.                           A 4 mm polyp was found in the ascending colon. The                            polyp was sessile. The polyp was removed with a                            cold snare. Resection and retrieval were complete.  A 3 mm polyp was found in the hepatic flexure. The                            polyp was sessile. The polyp was removed with a                            cold snare. Resection and retrieval were complete.                           A 3 mm polyp was found in the descending colon. The                            polyp was sessile. The polyp was removed with a                            cold snare. Resection and retrieval were complete.                           A 4 mm polyp was found in the rectum. The polyp was                            sessile. The polyp was removed with a cold snare.                            Resection and retrieval were complete.                           Many medium-mouthed diverticula were found in the                            left colon. The sigmoid was very angulated and took                            some time to traverse.                            The exam was otherwise without abnormality. Complications:            No immediate complications. Estimated blood loss:                            Minimal. Estimated Blood Loss:     Estimated blood loss was minimal. Impression:               - One 4 mm polyp in the ascending colon, removed                            with a cold snare. Resected and retrieved.                           - One 3 mm polyp at the hepatic flexure, removed  with a cold snare. Resected and retrieved.                           - One 3 mm polyp in the descending colon, removed                            with a cold snare. Resected and retrieved.                           - One 4 mm polyp in the rectum, removed with a cold                            snare. Resected and retrieved.                           - Diverticulosis in the left colon with multiple                            sharp angulations which prolonged this procedure.                           - The examination was otherwise normal. Recommendation:           - Patient has a contact number available for                            emergencies. The signs and symptoms of potential                            delayed complications were discussed with the                            patient. Return to normal activities tomorrow.                            Written discharge instructions were provided to the                            patient.                           - Resume previous diet.                           - Continue present medications.                           - Await pathology results.                           - Repeat colonoscopy is recommended for                            surveillance. The colonoscopy date will be  determined after pathology results from today's                            exam become available for review. Remo Lipps P. Eliabeth Shoff MD, MD 01/31/2017 3:01:32 PM This report has  been signed electronically.

## 2017-01-31 NOTE — Progress Notes (Signed)
Called to room to assist during endoscopic procedure.  Patient ID and intended procedure confirmed with present staff. Received instructions for my participation in the procedure from the performing physician.  

## 2017-02-05 ENCOUNTER — Telehealth: Payer: Self-pay

## 2017-02-05 NOTE — Telephone Encounter (Signed)
Patient states that she is fine and not having any problems.

## 2017-02-05 NOTE — Telephone Encounter (Signed)
  Follow up Call-  Call back number 01/31/2017 10/25/2015  Post procedure Call Back phone  # [phone]  Permission to leave phone message Yes Yes  Some recent data might be hidden     Patient questions:  Do you have a fever, pain , or abdominal swelling? No. Pain Score  0 *  Have you tolerated food without any problems? Yes.    Have you been able to return to your normal activities? Yes.    Do you have any questions about your discharge instructions: Diet   No. Medications  No. Follow up visit  No.  Do you have questions or concerns about your Care? No.  Actions: * If pain score is 4 or above: No action needed, pain <4.

## 2017-02-13 ENCOUNTER — Encounter: Payer: Self-pay | Admitting: Gastroenterology

## 2017-03-09 ENCOUNTER — Other Ambulatory Visit: Payer: Self-pay | Admitting: Internal Medicine

## 2017-03-09 DIAGNOSIS — I10 Essential (primary) hypertension: Secondary | ICD-10-CM

## 2017-03-11 ENCOUNTER — Other Ambulatory Visit: Payer: Self-pay | Admitting: Internal Medicine

## 2017-03-11 DIAGNOSIS — I10 Essential (primary) hypertension: Secondary | ICD-10-CM

## 2017-03-11 DIAGNOSIS — Z1231 Encounter for screening mammogram for malignant neoplasm of breast: Secondary | ICD-10-CM

## 2017-03-20 ENCOUNTER — Ambulatory Visit
Admission: RE | Admit: 2017-03-20 | Discharge: 2017-03-20 | Disposition: A | Discharge status: Home / Self Care | Payer: Medicare HMO | Source: Ambulatory Visit | Attending: Internal Medicine | Admitting: Internal Medicine

## 2017-03-20 DIAGNOSIS — Z1231 Encounter for screening mammogram for malignant neoplasm of breast: Secondary | ICD-10-CM | POA: Diagnosis not present

## 2017-03-21 ENCOUNTER — Encounter: Payer: Self-pay | Admitting: *Deleted

## 2017-03-26 DIAGNOSIS — Z720 Tobacco use: Secondary | ICD-10-CM | POA: Diagnosis not present

## 2017-03-26 DIAGNOSIS — R69 Illness, unspecified: Secondary | ICD-10-CM | POA: Diagnosis not present

## 2017-03-26 DIAGNOSIS — E785 Hyperlipidemia, unspecified: Secondary | ICD-10-CM | POA: Diagnosis not present

## 2017-03-26 DIAGNOSIS — Z6830 Body mass index (BMI) 30.0-30.9, adult: Secondary | ICD-10-CM | POA: Diagnosis not present

## 2017-03-26 DIAGNOSIS — Z7982 Long term (current) use of aspirin: Secondary | ICD-10-CM | POA: Diagnosis not present

## 2017-03-26 DIAGNOSIS — E669 Obesity, unspecified: Secondary | ICD-10-CM | POA: Diagnosis not present

## 2017-03-26 DIAGNOSIS — J309 Allergic rhinitis, unspecified: Secondary | ICD-10-CM | POA: Diagnosis not present

## 2017-03-26 DIAGNOSIS — I10 Essential (primary) hypertension: Secondary | ICD-10-CM | POA: Diagnosis not present

## 2017-03-26 DIAGNOSIS — Z823 Family history of stroke: Secondary | ICD-10-CM | POA: Diagnosis not present

## 2017-03-26 DIAGNOSIS — R609 Edema, unspecified: Secondary | ICD-10-CM | POA: Diagnosis not present

## 2017-04-24 ENCOUNTER — Telehealth: Payer: Self-pay | Admitting: Internal Medicine

## 2017-04-24 DIAGNOSIS — F418 Other specified anxiety disorders: Secondary | ICD-10-CM

## 2017-04-24 MED ORDER — CLORAZEPATE DIPOTASSIUM 7.5 MG PO TABS: ORAL_TABLET | ORAL | 0 refills | Status: DC

## 2017-04-24 NOTE — Telephone Encounter (Signed)
Last filled 01/28/17 for #180 Database checked and verified

## 2017-04-24 NOTE — Telephone Encounter (Signed)
Patient was called back from recent message left,  Stated she needed a controlled refill for Clorazepate and would like to have 3 refills and 180 tabs when being dispensed; She also stated she is almost out of them

## 2017-04-24 NOTE — Telephone Encounter (Signed)
Ok to dispense just 30 day supply and she should keep her upcoming appt.

## 2017-05-01 ENCOUNTER — Other Ambulatory Visit (INDEPENDENT_AMBULATORY_CARE_PROVIDER_SITE_OTHER): Payer: Medicare HMO

## 2017-05-01 DIAGNOSIS — I10 Essential (primary) hypertension: Secondary | ICD-10-CM

## 2017-05-01 LAB — CBC WITH DIFFERENTIAL/PLATELET
Basophils Absolute: 38 cells/uL (ref 0–200)
Basophils Relative: 1 %
Eosinophils Absolute: 160 cells/uL (ref 15–500)
Eosinophils Relative: 4.2 %
HCT: 36.8 % (ref 35.0–45.0)
Hemoglobin: 12.5 g/dL (ref 11.7–15.5)
Lymphs Abs: 1695 cells/uL (ref 850–3900)
MCH: 31.5 pg (ref 27.0–33.0)
MCHC: 34 g/dL (ref 32.0–36.0)
MCV: 92.7 fL (ref 80.0–100.0)
MPV: 10.8 fL (ref 7.5–12.5)
Monocytes Relative: 14.4 %
Neutro Abs: 1360 cells/uL — ABNORMAL LOW (ref 1500–7800)
Neutrophils Relative %: 35.8 %
Platelets: 170 10*3/uL (ref 140–400)
RBC: 3.97 10*6/uL (ref 3.80–5.10)
RDW: 11.3 % (ref 11.0–15.0)
Total Lymphocyte: 44.6 %
WBC mixed population: 547 cells/uL (ref 200–950)
WBC: 3.8 10*3/uL (ref 3.8–10.8)

## 2017-05-01 LAB — COMPLETE METABOLIC PANEL WITH GFR
AG Ratio: 1.4 (calc) (ref 1.0–2.5)
ALT: 9 U/L (ref 6–29)
AST: 13 U/L (ref 10–35)
Albumin: 3.8 g/dL (ref 3.6–5.1)
Alkaline phosphatase (APISO): 64 U/L (ref 33–130)
BUN: 15 mg/dL (ref 7–25)
CO2: 31 mmol/L (ref 20–32)
Calcium: 9 mg/dL (ref 8.6–10.4)
Chloride: 100 mmol/L (ref 98–110)
Creat: 0.88 mg/dL (ref 0.60–0.93)
GFR, Est African American: 75 mL/min/{1.73_m2} (ref >=60)
GFR, Est Non African American: 65 mL/min/{1.73_m2} (ref >=60)
Globulin: 2.8 g/dL (calc) (ref 1.9–3.7)
Glucose, Bld: 83 mg/dL (ref 65–99)
Potassium: 4 mmol/L (ref 3.5–5.3)
Sodium: 137 mmol/L (ref 135–146)
Total Bilirubin: 0.6 mg/dL (ref 0.2–1.2)
Total Protein: 6.6 g/dL (ref 6.1–8.1)

## 2017-05-05 ENCOUNTER — Ambulatory Visit (INDEPENDENT_AMBULATORY_CARE_PROVIDER_SITE_OTHER): Payer: Medicare HMO | Admitting: Internal Medicine

## 2017-05-05 ENCOUNTER — Encounter: Payer: Self-pay | Admitting: Internal Medicine

## 2017-05-05 VITALS — BP 120/62 | HR 64 | Temp 97.4°F | Ht 61.0 in | Wt 163.0 lb

## 2017-05-05 DIAGNOSIS — G8929 Other chronic pain: Secondary | ICD-10-CM

## 2017-05-05 DIAGNOSIS — R601 Generalized edema: Secondary | ICD-10-CM

## 2017-05-05 DIAGNOSIS — I10 Essential (primary) hypertension: Secondary | ICD-10-CM | POA: Diagnosis not present

## 2017-05-05 DIAGNOSIS — M545 Low back pain, unspecified: Secondary | ICD-10-CM

## 2017-05-05 DIAGNOSIS — E785 Hyperlipidemia, unspecified: Secondary | ICD-10-CM

## 2017-05-05 DIAGNOSIS — F3341 Major depressive disorder, recurrent, in partial remission: Secondary | ICD-10-CM

## 2017-05-05 DIAGNOSIS — R69 Illness, unspecified: Secondary | ICD-10-CM | POA: Diagnosis not present

## 2017-05-05 DIAGNOSIS — F411 Generalized anxiety disorder: Secondary | ICD-10-CM | POA: Diagnosis not present

## 2017-05-05 MED ORDER — CLONIDINE HCL 0.1 MG PO TABS: 0.1000 mg | ORAL_TABLET | Freq: Two times a day (BID) | ORAL | 0 refills | Status: DC

## 2017-05-05 MED ORDER — METHOCARBAMOL 500 MG PO TABS: ORAL_TABLET | ORAL | 0 refills | Status: DC

## 2017-05-05 MED ORDER — LOSARTAN POTASSIUM 100 MG PO TABS: 100.0000 mg | ORAL_TABLET | Freq: Every day | ORAL | 0 refills | Status: DC

## 2017-05-05 NOTE — Progress Notes (Signed)
Location:  [facility] clinic Provider:  Tiffany L. Mariea Clonts, D.O., C.M.D.  Code Status: Full Code  Goals of Care:  Advanced Directives 05/05/2017  Does Patient Have a Medical Advance Directive? No  Would patient like information on creating a medical advance directive? No - Patient declined     Chief Complaint  Patient presents with  . Annual Exam    CPE  . ACP    no ACP    HPI: [name] seen today for medical management of chronic diseases.    Bunion: On the right side. It is bothersome, but she is unsure if she wants to have it removed due to healing time. She rather wait for this until another time so she doesn't have to miss the gym time. She has ordered a splint to wear at night and is hoping this will help.   Nervousness/Anxiety: She is taking tranxene and noticed this works for her okay, but she has noticed an increase in waking up feeling nervous. She has been alone for 6 years as her husband died at that time. She reports sleeping well and does not feel afraid to live alone. She enjoys reading the bible first thing in the morning, states this will help at times. The nervousness wears off within a couple of hours. At times she will lay down if its bad. She enjoys going to the gym and will go to the gym several times a week and this has helped her tremendolously with anxiety.   She enjoys zumba classes a week. She also does a chair class. She walks other days when she does not have class. She also will walk at home if she does not get to the gym. She will use the ab and thigh machines. Will use kettle bells occasionally. She does have mild back pain and neck at times. This is on going issue from history.   Edema: She continues to take Furosemide and compression socks and has improvement of the leg swelling.  HTN: Blood pressure is in range today and she is tolerating her medication regimen well.    Past Medical History:  Diagnosis Date  . Abnormality of gait  07/11/2005  . Allergy   . Anxiety state, unspecified 04/08/2012  . Asymptomatic varicose veins 09/24/2011  . Cataract    bilateral  . Chronic venous insufficiency 09/16/2012  . Degenerative and vascular disorders of ear, unspecified 08/25/2002  . Depressive disorder, not elsewhere classified 11/10/2002  . Diverticulosis of colon (without mention of hemorrhage) 08/25/1989  . Edema 09/24/2011  . Esophageal reflux 11/10/2002   pt denies reflux  . First degree atrioventricular block 07/19/2008  . Heart murmur   . Hypertension   . Inguinal hernia without mention of obstruction or gangrene, unilateral or unspecified, (not specified as recurrent) 11/28/2006  . Insomnia, unspecified 12/15/2007  . Leiomyoma of uterus, unspecified 05/27/2006  . Leukocytopenia, unspecified 08/28/2010  . Lumbago 07/22/2007  . Neuromuscular disorder ([place])    cervical disc pain/radiating  . Obesity, unspecified 05/26/1996  . Osteoarthrosis, unspecified whether generalized or localized, unspecified site 05/26/2004  . Other abnormal blood chemistry 07/19/2008  . Other and unspecified hyperlipidemia 10/24/2004  . Palpitations 08/29/2007  . Peptic ulcer, unspecified site, unspecified as acute or chronic, without mention of hemorrhage, perforation, or obstruction 05/27/2006  . Scoliosis (and kyphoscoliosis), idiopathic 05/23/2009  . Symptomatic menopausal or female climacteric states 05/28/1990  . Unspecified essential hypertension 11/13/2004    Past Surgical History:  Procedure Laterality Date  .  CHOLECYSTECTOMY  1971  . ENDOMETRIAL BIOPSY  03/1992   Dr. Mallie Mussel  . FOOT SURGERY Left 04/2003   [facility]- Dr. Caffie Pinto  . INGUINAL HERNIA REPAIR Right 03/2007   Dr. Georgette Dover  . KNEE ARTHROSCOPY Left 07/1997   Dr. Wonda Olds  . KNEE SURGERY Right 03/02/2004   Dr. Berenice Primas  . OOPHORECTOMY Right 1974   Dr. Amalia Hailey  . THUMB ARTHROSCOPY Right 1995   Dr.Sypher  . TONSILLECTOMY AND ADENOIDECTOMY  1975  . TUBAL LIGATION     . VESICOVAGINAL FISTULA CLOSURE W/ TAH  1975    Allergies  Allergen Reactions  . Caffeine Other (See Comments)    Rapid heart rate   . Demerol [Meperidine]   . Venlafaxine Hcl Other (See Comments)    Hyper  . Pseudoephedrine Palpitations    Outpatient Encounter Medications as of 05/05/2017  Medication Sig  . aspirin 81 MG tablet Take 81 mg by mouth daily.   . cholecalciferol (VITAMIN D) 1000 units tablet Take 1,000 Units by mouth daily.  . cloNIDine (CATAPRES) 0.1 MG tablet TAKE 1 TABLET BY MOUTH TWICE A DAY TO CONTROL BLOOD PRESSURE  . clorazepate (TRANXENE) 7.5 MG tablet TAKE 1 TABLET BY MOUTH TWICE A DAY FOR ANXIETY  . fexofenadine (HM FEXOFENADINE HCL) 180 MG tablet Take 180 mg by mouth daily.   . furosemide (LASIX) 40 MG tablet Take 1 tablet (40 mg total) by mouth daily.  Marland Kitchen GARLIC PO Take 1 tablet by mouth daily.   . hyoscyamine (LEVBID) 0.375 MG 12 hr tablet Take 0.375 mg 2 (two) times daily by mouth.  Marland Kitchen KRILL OIL PO Take by mouth daily.  Marland Kitchen losartan (COZAAR) 100 MG tablet TAKE 1 TABLET BY MOUTH EVERY DAY  . simvastatin (ZOCOR) 40 MG tablet One daily to lower cholesterol  . [DISCONTINUED] methocarbamol (ROBAXIN) 500 MG tablet TAKE ONE TABLET BY MOUTH UP TO THREE TIMES DAILY IN 24 HOURS TO HELP MUSCLE SPASM (Patient not taking: Reported on 01/31/2017)   Facility-Administered Encounter Medications as of 05/05/2017  Medication  . 0.9 %  sodium chloride infusion    Review of Systems:  Review of Systems  Constitutional: Negative for chills, fever and malaise/fatigue.  Eyes:       Glasses  Respiratory: Negative for cough and shortness of breath.   Cardiovascular: Negative for chest pain, palpitations and leg swelling.  Gastrointestinal: Negative for blood in stool.  Genitourinary: Negative for hematuria.  Musculoskeletal: Negative.   Neurological: Negative.   Psychiatric/Behavioral: Positive for depression. Negative for memory loss. The patient is nervous/anxious. The  patient does not have insomnia.     Health Maintenance  Topic Date Due  . PNA vac Low Risk Adult (2 of 2 - PPSV23) 11/04/2017  . Fecal DNA (Cologuard)  08/03/2018  . MAMMOGRAM  03/21/2019  . TETANUS/TDAP  03/05/2021  . INFLUENZA VACCINE  Completed  . DEXA SCAN  Completed    Physical Exam: Vitals:   05/05/17 1333  BP: 120/62  Pulse: 64  Temp: (!) 97.4 F (36.3 C)  TempSrc: Oral  Weight: 163 lb (73.9 kg)  Height: 5\' 1"  (1.549 m)   Body mass index is 30.8 kg/m. Physical Exam  Constitutional: She is oriented to person, place, and time. She appears well-developed and well-nourished.  HENT:  Head: Normocephalic.  Cardiovascular: Normal rate, regular rhythm, normal heart sounds and intact distal pulses.  Pulmonary/Chest: Effort normal and breath sounds normal.  Musculoskeletal: Normal range of motion.  Neurological: She is alert and oriented to person,  place, and time.  Skin: Skin is warm and dry. Capillary refill takes less than 2 seconds.  Psychiatric: She has a normal mood and affect. Her behavior is normal. Judgment and thought content normal.  Vitals reviewed.   Labs reviewed: Basic Metabolic Panel: Recent Labs    10/31/16 0903 05/01/17 0856  NA 134* 137  K 4.4 4.0  CL 99 100  CO2 30 31  GLUCOSE 84 83  BUN 9 15  CREATININE 0.69 0.88  CALCIUM 8.8 9.0   Liver Function Tests: Recent Labs    10/31/16 0903 05/01/17 0856  AST 15 13  ALT 11 9  BILITOT 0.6 0.6  PROT 6.4 6.6   No results for input(s): LIPASE, AMYLASE in the last 8760 hours. No results for input(s): AMMONIA in the last 8760 hours. CBC: Recent Labs    05/01/17 0856  WBC 3.8  NEUTROABS 1,360*  HGB 12.5  HCT 36.8  MCV 92.7  PLT 170   Lipid Panel: Recent Labs    10/31/16 0903  CHOL 130  HDL 57  TRIG 71  CHOLHDL 2.3   No results found for: HGBA1C  Procedures since last visit: No results found.    Assessment/Plan 1. Essential hypertension She is stable and within range  today at the visit. Advised to continue current medication regimen.  - EKG 12-Lead - cloNIDine (CATAPRES) 0.1 MG tablet; Take 1 tablet (0.1 mg total) by mouth 2 (two) times daily.  Dispense: 180 tablet; Refill: 0 - losartan (COZAAR) 100 MG tablet; Take 1 tablet (100 mg total) by mouth daily.  Dispense: 90 tablet; Refill: 0  2. Generalized edema She has on-going edema. She states the lasix and compression stockings help this and her legs are without discomfort.  3. Chronic low back pain without sciatica, unspecified back pain laterality She continues to have mild back pain at times, though the gym has helped this a lot. Will give a refill on Robaxin that will help with this when it gets uncontrolled.  - methocarbamol (ROBAXIN) 500 MG tablet; TAKE ONE TABLET BY MOUTH UP TO THREE TIMES DAILY IN 24 HOURS TO HELP MUSCLE SPASM  Dispense: 90 tablet; Refill: 0  4. Recurrent major depressive disorder, in partial remission ([place]) She continues to have mild depression, but she reports feeling well today in the office.    5. Anxiety state  She takes tranxene daily for anxiety and this seems to be working well. She still has mild anxiety when waking up, but this goes away. Will continue the tranxene for now.   6. Hyperlipidemia, unspecified hyperlipidemia type Her last lipid panel was in range. Will assess in future.   Labs/tests ordered:   Orders Placed This Encounter  Procedures  . CBC with Differential/Platelet    Standing Status:   Future    Standing Expiration Date:   05/06/2018  . COMPLETE METABOLIC PANEL WITH GFR    Standing Status:   Future    Standing Expiration Date:   05/06/2018  . Lipid panel    Standing Status:   Future    Standing Expiration Date:   05/06/2018  . EKG 12-Lead     Next appt:  11/05/2017   Karen Kays, RN, DNP Student  Geriatrics [facility] [phone], [place] [address] Cell Phone (Mon-Fri 8am-5pm):   [phone] On Call:  [phone] & follow prompts after 5pm & weekends Office Phone:  [phone] Office Fax:  [phone]

## 2017-05-05 NOTE — Patient Instructions (Addendum)
Continue to work out and take medications as prescribed to help with anxiety.

## 2017-05-26 ENCOUNTER — Other Ambulatory Visit: Payer: Self-pay | Admitting: Internal Medicine

## 2017-05-26 DIAGNOSIS — F418 Other specified anxiety disorders: Secondary | ICD-10-CM

## 2017-05-29 ENCOUNTER — Other Ambulatory Visit: Payer: Self-pay | Admitting: Internal Medicine

## 2017-05-29 DIAGNOSIS — F418 Other specified anxiety disorders: Secondary | ICD-10-CM

## 2017-06-27 ENCOUNTER — Other Ambulatory Visit: Payer: Self-pay | Admitting: Internal Medicine

## 2017-06-27 DIAGNOSIS — F418 Other specified anxiety disorders: Secondary | ICD-10-CM

## 2017-06-27 NOTE — Telephone Encounter (Signed)
A medication refill was received from pharmacy for clorazepate 7.5 mg. Rx was pended to provider for approval  after verifying last fill date, provider, and quantity on PMP [place]

## 2017-07-25 ENCOUNTER — Telehealth: Payer: Self-pay | Admitting: Internal Medicine

## 2017-07-25 NOTE — Telephone Encounter (Signed)
I called the pt to schedule her AWV, but there was no answer and no option to leave a message. VDM (DD)

## 2017-07-28 ENCOUNTER — Other Ambulatory Visit: Payer: Self-pay | Admitting: Internal Medicine

## 2017-07-28 DIAGNOSIS — F418 Other specified anxiety disorders: Secondary | ICD-10-CM

## 2017-08-24 ENCOUNTER — Other Ambulatory Visit: Payer: Self-pay | Admitting: Internal Medicine

## 2017-08-24 DIAGNOSIS — F418 Other specified anxiety disorders: Secondary | ICD-10-CM

## 2017-08-25 NOTE — Telephone Encounter (Signed)
[place] Database verified and compliance confirmed   RX called in

## 2017-09-04 ENCOUNTER — Other Ambulatory Visit: Payer: Self-pay | Admitting: Internal Medicine

## 2017-09-04 DIAGNOSIS — I10 Essential (primary) hypertension: Secondary | ICD-10-CM

## 2017-09-25 ENCOUNTER — Other Ambulatory Visit: Payer: Self-pay

## 2017-09-25 DIAGNOSIS — F418 Other specified anxiety disorders: Secondary | ICD-10-CM

## 2017-09-25 MED ORDER — CLORAZEPATE DIPOTASSIUM 7.5 MG PO TABS: ORAL_TABLET | ORAL | 0 refills | Status: DC

## 2017-09-25 NOTE — Telephone Encounter (Signed)
[place] Database verified and compliance confirmed   

## 2017-10-28 ENCOUNTER — Other Ambulatory Visit: Payer: Self-pay | Admitting: *Deleted

## 2017-10-28 DIAGNOSIS — F418 Other specified anxiety disorders: Secondary | ICD-10-CM

## 2017-10-28 MED ORDER — CLORAZEPATE DIPOTASSIUM 7.5 MG PO TABS: ORAL_TABLET | ORAL | 0 refills | Status: DC

## 2017-11-03 ENCOUNTER — Other Ambulatory Visit: Payer: Medicare HMO

## 2017-11-03 DIAGNOSIS — E785 Hyperlipidemia, unspecified: Secondary | ICD-10-CM

## 2017-11-03 DIAGNOSIS — R601 Generalized edema: Secondary | ICD-10-CM | POA: Diagnosis not present

## 2017-11-03 DIAGNOSIS — I10 Essential (primary) hypertension: Secondary | ICD-10-CM

## 2017-11-03 LAB — CBC WITH DIFFERENTIAL/PLATELET
Basophils Absolute: 41 cells/uL (ref 0–200)
Basophils Relative: 1.1 %
Eosinophils Absolute: 178 cells/uL (ref 15–500)
Eosinophils Relative: 4.8 %
HCT: 37.5 % (ref 35.0–45.0)
Hemoglobin: 12.5 g/dL (ref 11.7–15.5)
Lymphs Abs: 1321 cells/uL (ref 850–3900)
MCH: 31.1 pg (ref 27.0–33.0)
MCHC: 33.3 g/dL (ref 32.0–36.0)
MCV: 93.3 fL (ref 80.0–100.0)
MPV: 10.9 fL (ref 7.5–12.5)
Monocytes Relative: 13.7 %
Neutro Abs: 1654 cells/uL (ref 1500–7800)
Neutrophils Relative %: 44.7 %
Platelets: 178 10*3/uL (ref 140–400)
RBC: 4.02 10*6/uL (ref 3.80–5.10)
RDW: 11.4 % (ref 11.0–15.0)
Total Lymphocyte: 35.7 %
WBC mixed population: 507 cells/uL (ref 200–950)
WBC: 3.7 10*3/uL — ABNORMAL LOW (ref 3.8–10.8)

## 2017-11-03 LAB — COMPLETE METABOLIC PANEL WITH GFR
AG Ratio: 1.6 (calc) (ref 1.0–2.5)
ALT: 10 U/L (ref 6–29)
AST: 16 U/L (ref 10–35)
Albumin: 3.9 g/dL (ref 3.6–5.1)
Alkaline phosphatase (APISO): 71 U/L (ref 33–130)
BUN/Creatinine Ratio: 12 (calc) (ref 6–22)
BUN: 11 mg/dL (ref 7–25)
CO2: 31 mmol/L (ref 20–32)
Calcium: 9.2 mg/dL (ref 8.6–10.4)
Chloride: 100 mmol/L (ref 98–110)
Creat: 0.95 mg/dL — ABNORMAL HIGH (ref 0.60–0.93)
GFR, Est African American: 68 mL/min/{1.73_m2} (ref >=60)
GFR, Est Non African American: 59 mL/min/{1.73_m2} — ABNORMAL LOW (ref >=60)
Globulin: 2.5 g/dL (calc) (ref 1.9–3.7)
Glucose, Bld: 84 mg/dL (ref 65–99)
Potassium: 4.1 mmol/L (ref 3.5–5.3)
Sodium: 138 mmol/L (ref 135–146)
Total Bilirubin: 0.4 mg/dL (ref 0.2–1.2)
Total Protein: 6.4 g/dL (ref 6.1–8.1)

## 2017-11-03 LAB — LIPID PANEL
Cholesterol: 146 mg/dL (ref <200)
HDL: 54 mg/dL (ref >50)
LDL Cholesterol (Calc): 79 mg/dL (calc)
Non-HDL Cholesterol (Calc): 92 mg/dL (calc) (ref <130)
Total CHOL/HDL Ratio: 2.7 (calc) (ref <5.0)
Triglycerides: 45 mg/dL (ref <150)

## 2017-11-06 ENCOUNTER — Ambulatory Visit (INDEPENDENT_AMBULATORY_CARE_PROVIDER_SITE_OTHER): Payer: Medicare HMO | Admitting: Internal Medicine

## 2017-11-06 ENCOUNTER — Encounter: Payer: Self-pay | Admitting: Internal Medicine

## 2017-11-06 VITALS — BP 120/70 | HR 65 | Temp 98.2°F | Ht 61.25 in | Wt 155.0 lb

## 2017-11-06 DIAGNOSIS — I10 Essential (primary) hypertension: Secondary | ICD-10-CM

## 2017-11-06 DIAGNOSIS — Z716 Tobacco abuse counseling: Secondary | ICD-10-CM

## 2017-11-06 DIAGNOSIS — I739 Peripheral vascular disease, unspecified: Secondary | ICD-10-CM | POA: Diagnosis not present

## 2017-11-06 DIAGNOSIS — K5901 Slow transit constipation: Secondary | ICD-10-CM | POA: Diagnosis not present

## 2017-11-06 DIAGNOSIS — E785 Hyperlipidemia, unspecified: Secondary | ICD-10-CM | POA: Diagnosis not present

## 2017-11-06 DIAGNOSIS — R69 Illness, unspecified: Secondary | ICD-10-CM | POA: Diagnosis not present

## 2017-11-06 DIAGNOSIS — F3341 Major depressive disorder, recurrent, in partial remission: Secondary | ICD-10-CM | POA: Diagnosis not present

## 2017-11-06 MED ORDER — VARENICLINE TARTRATE 0.5 MG X 11 & 1 MG X 42 PO MISC: ORAL | 0 refills | Status: DC

## 2017-11-06 MED ORDER — VARENICLINE TARTRATE 1 MG PO TABS: 1.0000 mg | ORAL_TABLET | Freq: Two times a day (BID) | ORAL | 2 refills | Status: DC

## 2017-11-06 NOTE — Patient Instructions (Signed)
Give [place] a call in October to get your flu shot or be sure to get it at the pharmacy near your home.

## 2017-11-06 NOTE — Progress Notes (Signed)
Location:  [facility] clinic Provider:  Ravis Herne L. Mariea Sawyer, D.O., C.M.D.  Code Status: full code Goals of Care:  Advanced Directives 05/05/2017  Does Patient Have a Medical Advance Directive? No  Would patient like information on creating a medical advance directive? No - Patient declined   Chief Complaint  Patient presents with  . Medical Management of Chronic Issues    41mth follow-up    HPI: [name] seen today for medical management of chronic diseases.    Still going to the gym or walking at home on the treadmill 4 times per week.   Not depressed just lonely.  Gets lonesome at times.  Her children work.  They call her or text her regularly.  Plays word games on her phone.  She still has trouble with constipation.  When she takes colace, she's fine.  When she first started having trouble, she took it daily, then moved it to 3 times a week.  She was using 3 of the tablets.  Counseled that 3 times a week is ok for the colace 2 tablets.    Only uses allegra as needed.  Took one a couple weeks ago for stuffiness in her head after being outside for something--had a trigger.    No pain.  A little stiffness first thing.  Gets better after the gym.  She says she gets walking on the treadmill and talking to people.  Back not bothering her right now.    Went to visit [place] at the hospital and she was at the end of a long hall at [facility].  She had a problem with her graft.    Thinks her cataracts are getting worse.  Sees ophtho in oct or nov.  Right is worse than left.    No longer has ulcer on her toe.  She had the callus ground down.    Cholesterol at goal, but LDL has trended up.  Eats the candy she buys for her grandchildren. At night she gets nervous and eats them.    Past Medical History:  Diagnosis Date  . Abnormality of gait 07/11/2005  . Allergy   . Anxiety state, unspecified 04/08/2012  . Asymptomatic varicose veins 09/24/2011  . Cataract    bilateral  . Chronic venous insufficiency 09/16/2012  . Degenerative and vascular disorders of ear, unspecified 08/25/2002  . Depressive disorder, not elsewhere classified 11/10/2002  . Diverticulosis of colon (without mention of hemorrhage) 08/25/1989  . Edema 09/24/2011  . Esophageal reflux 11/10/2002   pt denies reflux  . First degree atrioventricular block 07/19/2008  . Heart murmur   . Hypertension   . Inguinal hernia without mention of obstruction or gangrene, unilateral or unspecified, (not specified as recurrent) 11/28/2006  . Insomnia, unspecified 12/15/2007  . Leiomyoma of uterus, unspecified 05/27/2006  . Leukocytopenia, unspecified 08/28/2010  . Lumbago 07/22/2007  . Neuromuscular disorder ([place])    cervical disc pain/radiating  . Obesity, unspecified 05/26/1996  . Osteoarthrosis, unspecified whether generalized or localized, unspecified site 05/26/2004  . Other abnormal blood chemistry 07/19/2008  . Other and unspecified hyperlipidemia 10/24/2004  . Palpitations 08/29/2007  . Peptic ulcer, unspecified site, unspecified as acute or chronic, without mention of hemorrhage, perforation, or obstruction 05/27/2006  . Scoliosis (and kyphoscoliosis), idiopathic 05/23/2009  . Symptomatic menopausal or female climacteric states 05/28/1990  . Unspecified essential hypertension 11/13/2004    Past Surgical History:  Procedure Laterality Date  . CHOLECYSTECTOMY  1971  . ENDOMETRIAL BIOPSY  03/1992   Dr. Mallie Mussel  . FOOT SURGERY Left 04/2003   [facility]- Dr. Caffie Pinto  . INGUINAL HERNIA REPAIR Right 03/2007   Dr. Georgette Dover  . KNEE ARTHROSCOPY Left 07/1997   Dr. Wonda Olds  . KNEE SURGERY Right 03/02/2004   Dr. Berenice Primas  . OOPHORECTOMY Right 1974   Dr. Amalia Hailey  . THUMB ARTHROSCOPY Right 1995   Dr.Sypher  . TONSILLECTOMY AND ADENOIDECTOMY  1975  . TUBAL LIGATION    . VESICOVAGINAL FISTULA CLOSURE W/ TAH  1975    Allergies  Allergen Reactions  . Caffeine Other (See Comments)     Rapid heart rate   . Demerol [Meperidine]   . Venlafaxine Hcl Other (See Comments)    Hyper  . Pseudoephedrine Palpitations    Outpatient Encounter Medications as of 11/06/2017  Medication Sig  . aspirin 81 MG tablet Take 81 mg by mouth daily.   . cholecalciferol (VITAMIN D) 1000 units tablet Take 1,000 Units by mouth daily.  . cloNIDine (CATAPRES) 0.1 MG tablet Take 1 tablet (0.1 mg total) by mouth 2 (two) times daily.  . clorazepate (TRANXENE) 7.5 MG tablet TAKE 1 TABLET BY MOUTH TWICE A DAY  . fexofenadine (HM FEXOFENADINE HCL) 180 MG tablet Take 180 mg by mouth daily as needed.   . furosemide (LASIX) 40 MG tablet TAKE 1 TABLET BY MOUTH EVERY DAY  . GARLIC PO Take 1 tablet by mouth daily.   . hyoscyamine (LEVBID) 0.375 MG 12 hr tablet Take 0.375 mg 2 (two) times daily by mouth.  Marland Kitchen KRILL OIL PO Take by mouth daily.  Marland Kitchen losartan (COZAAR) 100 MG tablet TAKE 1 TABLET BY MOUTH EVERY DAY FOR BLOOD PRESSURE  . methocarbamol (ROBAXIN) 500 MG tablet TAKE ONE TABLET BY MOUTH UP TO THREE TIMES DAILY IN 24 HOURS TO HELP MUSCLE SPASM  . simvastatin (ZOCOR) 40 MG tablet TAKE 1 TABLET BY MOUTH EVERY DAY TO LOWER CHOLESTEROL  . varenicline (CHANTIX CONTINUING MONTH PAK) 1 MG tablet Take 1 tablet (1 mg total) by mouth 2 (two) times daily.  . varenicline (CHANTIX STARTING MONTH PAK) 0.5 MG X 11 & 1 MG X 42 tablet Take one 0.5 mg tablet daily for 3 days, then one 0.5 mg tablet twice daily for 4 days, then one 1 mg tablet twice daily.   No facility-administered encounter medications on file as of 11/06/2017.     Review of Systems:  Review of Systems  Constitutional: Negative for chills, fever and malaise/fatigue.  HENT: Negative for congestion and hearing loss.   Eyes: Negative for blurred vision.  Respiratory: Negative for shortness of breath.   Cardiovascular: Negative for chest pain, palpitations and leg swelling.  Gastrointestinal: Positive for constipation. Negative for abdominal pain, blood in  stool, diarrhea and melena.  Genitourinary: Negative for dysuria.  Musculoskeletal: Positive for back pain and joint pain. Negative for falls.       Minor am stiffness   Skin: Negative for itching and rash.  Neurological: Negative for dizziness and loss of consciousness.  Endo/Heme/Allergies: Does not bruise/bleed easily.  Psychiatric/Behavioral: Negative for depression and memory loss. The patient is not nervous/anxious and does not have insomnia.        Some loneliness    Health Maintenance  Topic Date Due  . INFLUENZA VACCINE  09/25/2017  . PNA vac Low Risk Adult (2 of 2 - PPSV23) 11/04/2017  . Fecal DNA (Cologuard)  08/03/2018  . TETANUS/TDAP  03/05/2021  . DEXA SCAN  Completed    Physical  Exam: Vitals:   11/06/17 1001 11/06/17 1122  BP: 140/80 120/70  Pulse: 65   Temp: 98.2 F (36.8 C)   TempSrc: Oral   SpO2: 95%   Weight: 155 lb (70.3 kg)   Height: 5' 1.25" (1.556 m)    Body mass index is 29.05 kg/m. Physical Exam  Constitutional: She is oriented to person, place, and time. She appears well-developed and well-nourished. No distress.  HENT:  Head: Normocephalic and atraumatic.  Eyes:  glasses  Cardiovascular: Normal rate, regular rhythm and intact distal pulses.  Murmur heard. Chronic venous insufficiency, wearing compression hose  Pulmonary/Chest: Effort normal and breath sounds normal. No respiratory distress.  Abdominal: Soft. Bowel sounds are normal.  Musculoskeletal: Normal range of motion.  Neurological: She is alert and oriented to person, place, and time.  Skin: Skin is warm and dry.  Psychiatric: She has a normal mood and affect. Her behavior is normal.    Labs reviewed: Basic Metabolic Panel: Recent Labs    05/01/17 0856 11/03/17 0900  NA 137 138  K 4.0 4.1  CL 100 100  CO2 31 31  GLUCOSE 83 84  BUN 15 11  CREATININE 0.88 0.95*  CALCIUM 9.0 9.2   Liver Function Tests: Recent Labs    05/01/17 0856 11/03/17 0900  AST 13 16  ALT 9 10   BILITOT 0.6 0.4  PROT 6.6 6.4   No results for input(s): LIPASE, AMYLASE in the last 8760 hours. No results for input(s): AMMONIA in the last 8760 hours. CBC: Recent Labs    05/01/17 0856 11/03/17 0900  WBC 3.8 3.7*  NEUTROABS 1,360* 1,654  HGB 12.5 12.5  HCT 36.8 37.5  MCV 92.7 93.3  PLT 170 178   Lipid Panel: Recent Labs    11/03/17 0900  CHOL 146  HDL 54  LDLCALC 79  TRIG 45  CHOLHDL 2.7   No results found for: HGBA1C  Assessment/Plan 1. Slow transit constipation -use colace 2 tabs 3 times a week and less if loose stools develop  2. Essential hypertension -bp at goal on recheck - CBC with Differential/Platelet; Future - COMPLETE METABOLIC PANEL WITH GFR; Future  3. Hyperlipidemia, unspecified hyperlipidemia type - satisfactory for her age, but has been trending up--plans to reduce candy intake, cont treadmill and walking outside - Lipid panel; Future  4. Recurrent major depressive disorder, in partial remission ([place]) -stable--gets lonely some, but overall doing well  5. PVD (peripheral vascular disease) ([place]) - try to get lipids down, bp controlled, biggest thing is need for smoking cessation - Lipid panel; Future  6. Tobacco abuse counseling - work on smoking cessation -she say she smokes due to anxiety and loneliness at night -will try chantix, but cost may be prohibitive varenicline (CHANTIX STARTING MONTH PAK) 0.5 MG X 11 & 1 MG X 42 tablet; Take one 0.5 mg tablet daily for 3 days, then one 0.5 mg tablet twice daily for 4 days, then one 1 mg tablet twice daily.  Dispense: 53 tablet; Refill: 0 - varenicline (CHANTIX CONTINUING MONTH PAK) 1 MG tablet; Take 1 tablet (1 mg total) by mouth 2 (two) times daily.  Dispense: 60 tablet; Refill: 2 - Lipid panel; Future -if cost of chantix too much, will order patches for her instead  Labs/tests ordered:   Orders Placed This Encounter  Procedures  . CBC with Differential/Platelet    Standing Status:   Future     Standing Expiration Date:   11/07/2018  . COMPLETE METABOLIC PANEL WITH  GFR    Standing Status:   Future    Standing Expiration Date:   11/07/2018  . Lipid panel    Standing Status:   Future    Standing Expiration Date:   11/07/2018    Next appt:  6 mos for CPE, fasting labs before   Stana Bayon L. Keymari Sato, D.O. [facility] [address], [place] [address] Cell Phone (Mon-Fri 8am-5pm):  [phone] On Call:  [phone] & follow prompts after 5pm & weekends Office Phone:  [phone] Office Fax:  [phone]

## 2017-11-20 ENCOUNTER — Other Ambulatory Visit: Payer: Self-pay | Admitting: *Deleted

## 2017-11-20 ENCOUNTER — Other Ambulatory Visit: Payer: Self-pay | Admitting: Internal Medicine

## 2017-11-20 DIAGNOSIS — F418 Other specified anxiety disorders: Secondary | ICD-10-CM

## 2017-11-20 MED ORDER — CLORAZEPATE DIPOTASSIUM 7.5 MG PO TABS: ORAL_TABLET | ORAL | 0 refills | Status: DC

## 2017-11-20 NOTE — Telephone Encounter (Signed)
Patient requested refill. Phoned to pharmacy.  

## 2017-11-21 ENCOUNTER — Other Ambulatory Visit: Payer: Self-pay | Admitting: *Deleted

## 2017-11-21 DIAGNOSIS — G8929 Other chronic pain: Secondary | ICD-10-CM

## 2017-11-21 DIAGNOSIS — R69 Illness, unspecified: Secondary | ICD-10-CM | POA: Diagnosis not present

## 2017-11-21 DIAGNOSIS — M545 Low back pain: Principal | ICD-10-CM

## 2017-11-21 MED ORDER — METHOCARBAMOL 500 MG PO TABS: ORAL_TABLET | ORAL | 0 refills | Status: DC

## 2017-11-24 ENCOUNTER — Other Ambulatory Visit: Payer: Self-pay | Admitting: *Deleted

## 2017-11-24 MED ORDER — CYCLOBENZAPRINE HCL 5 MG PO TABS: 5.0000 mg | ORAL_TABLET | Freq: Every evening | ORAL | 0 refills | Status: DC | PRN

## 2017-11-24 NOTE — Telephone Encounter (Signed)
Fax from pharmacy because insurance will not cover robaxin, per dr reed switch to flexeril. rx sent to pharmacy by e-script

## 2017-12-05 ENCOUNTER — Other Ambulatory Visit: Payer: Self-pay | Admitting: Internal Medicine

## 2017-12-05 DIAGNOSIS — I10 Essential (primary) hypertension: Secondary | ICD-10-CM

## 2017-12-25 ENCOUNTER — Other Ambulatory Visit: Payer: Self-pay | Admitting: Internal Medicine

## 2017-12-25 ENCOUNTER — Other Ambulatory Visit: Payer: Self-pay | Admitting: *Deleted

## 2017-12-25 DIAGNOSIS — F418 Other specified anxiety disorders: Secondary | ICD-10-CM

## 2017-12-25 MED ORDER — CLORAZEPATE DIPOTASSIUM 7.5 MG PO TABS: ORAL_TABLET | ORAL | 0 refills | Status: DC

## 2017-12-25 NOTE — Telephone Encounter (Signed)
Patient requested refill. Phoned to pharmacy.  

## 2018-01-26 ENCOUNTER — Other Ambulatory Visit: Payer: Self-pay | Admitting: Internal Medicine

## 2018-01-26 DIAGNOSIS — F418 Other specified anxiety disorders: Secondary | ICD-10-CM

## 2018-01-26 NOTE — Telephone Encounter (Signed)
Patient requested 

## 2018-02-16 ENCOUNTER — Other Ambulatory Visit: Payer: Self-pay | Admitting: Internal Medicine

## 2018-02-19 DIAGNOSIS — H5200 Hypermetropia, unspecified eye: Secondary | ICD-10-CM | POA: Diagnosis not present

## 2018-02-26 ENCOUNTER — Other Ambulatory Visit: Payer: Self-pay | Admitting: Internal Medicine

## 2018-02-26 ENCOUNTER — Other Ambulatory Visit: Payer: Self-pay | Admitting: *Deleted

## 2018-02-26 DIAGNOSIS — F418 Other specified anxiety disorders: Secondary | ICD-10-CM

## 2018-02-26 MED ORDER — CLORAZEPATE DIPOTASSIUM 7.5 MG PO TABS: ORAL_TABLET | ORAL | 0 refills | Status: DC

## 2018-02-26 NOTE — Telephone Encounter (Signed)
Patient requested refill

## 2018-03-06 ENCOUNTER — Other Ambulatory Visit: Payer: Self-pay | Admitting: Internal Medicine

## 2018-03-06 DIAGNOSIS — I10 Essential (primary) hypertension: Secondary | ICD-10-CM

## 2018-03-11 ENCOUNTER — Other Ambulatory Visit: Payer: Self-pay | Admitting: Internal Medicine

## 2018-03-11 DIAGNOSIS — Z1231 Encounter for screening mammogram for malignant neoplasm of breast: Secondary | ICD-10-CM

## 2018-04-09 ENCOUNTER — Ambulatory Visit
Admission: RE | Admit: 2018-04-09 | Discharge: 2018-04-09 | Disposition: A | Discharge status: Home / Self Care | Payer: Medicare HMO | Source: Ambulatory Visit | Attending: Internal Medicine | Admitting: Internal Medicine

## 2018-04-09 DIAGNOSIS — Z1231 Encounter for screening mammogram for malignant neoplasm of breast: Secondary | ICD-10-CM

## 2018-04-13 ENCOUNTER — Encounter: Payer: Self-pay | Admitting: *Deleted

## 2018-04-27 ENCOUNTER — Other Ambulatory Visit: Payer: Self-pay | Admitting: *Deleted

## 2018-04-27 DIAGNOSIS — F418 Other specified anxiety disorders: Secondary | ICD-10-CM

## 2018-04-27 MED ORDER — CLORAZEPATE DIPOTASSIUM 7.5 MG PO TABS: ORAL_TABLET | ORAL | 0 refills | Status: DC

## 2018-04-27 NOTE — Telephone Encounter (Signed)
Patient requested refill. Phoned to pharmacy.  

## 2018-05-04 ENCOUNTER — Other Ambulatory Visit: Payer: Medicare HMO

## 2018-05-04 DIAGNOSIS — E785 Hyperlipidemia, unspecified: Secondary | ICD-10-CM

## 2018-05-04 DIAGNOSIS — I739 Peripheral vascular disease, unspecified: Secondary | ICD-10-CM | POA: Diagnosis not present

## 2018-05-04 DIAGNOSIS — Z716 Tobacco abuse counseling: Secondary | ICD-10-CM | POA: Diagnosis not present

## 2018-05-04 DIAGNOSIS — I10 Essential (primary) hypertension: Secondary | ICD-10-CM | POA: Diagnosis not present

## 2018-05-04 LAB — COMPLETE METABOLIC PANEL WITH GFR
AG Ratio: 1.4 (calc) (ref 1.0–2.5)
ALT: 12 U/L (ref 6–29)
AST: 15 U/L (ref 10–35)
Albumin: 3.7 g/dL (ref 3.6–5.1)
Alkaline phosphatase (APISO): 64 U/L (ref 37–153)
BUN/Creatinine Ratio: 18 (calc) (ref 6–22)
BUN: 17 mg/dL (ref 7–25)
CO2: 30 mmol/L (ref 20–32)
Calcium: 8.8 mg/dL (ref 8.6–10.4)
Chloride: 103 mmol/L (ref 98–110)
Creat: 0.95 mg/dL — ABNORMAL HIGH (ref 0.60–0.93)
GFR, Est African American: 68 mL/min/{1.73_m2} (ref >=60)
GFR, Est Non African American: 59 mL/min/{1.73_m2} — ABNORMAL LOW (ref >=60)
Globulin: 2.7 g/dL (calc) (ref 1.9–3.7)
Glucose, Bld: 90 mg/dL (ref 65–99)
Potassium: 4.2 mmol/L (ref 3.5–5.3)
Sodium: 138 mmol/L (ref 135–146)
Total Bilirubin: 0.4 mg/dL (ref 0.2–1.2)
Total Protein: 6.4 g/dL (ref 6.1–8.1)

## 2018-05-04 LAB — CBC WITH DIFFERENTIAL/PLATELET
Absolute Monocytes: 538 cells/uL (ref 200–950)
Basophils Absolute: 31 cells/uL (ref 0–200)
Basophils Relative: 0.8 %
Eosinophils Absolute: 215 cells/uL (ref 15–500)
Eosinophils Relative: 5.5 %
HCT: 33.6 % — ABNORMAL LOW (ref 35.0–45.0)
Hemoglobin: 11.2 g/dL — ABNORMAL LOW (ref 11.7–15.5)
Lymphs Abs: 1833 cells/uL (ref 850–3900)
MCH: 31.2 pg (ref 27.0–33.0)
MCHC: 33.3 g/dL (ref 32.0–36.0)
MCV: 93.6 fL (ref 80.0–100.0)
MPV: 10.8 fL (ref 7.5–12.5)
Monocytes Relative: 13.8 %
Neutro Abs: 1283 cells/uL — ABNORMAL LOW (ref 1500–7800)
Neutrophils Relative %: 32.9 %
Platelets: 158 10*3/uL (ref 140–400)
RBC: 3.59 10*6/uL — ABNORMAL LOW (ref 3.80–5.10)
RDW: 11.7 % (ref 11.0–15.0)
Total Lymphocyte: 47 %
WBC: 3.9 10*3/uL (ref 3.8–10.8)

## 2018-05-04 LAB — LIPID PANEL
Cholesterol: 143 mg/dL (ref <200)
HDL: 59 mg/dL (ref >=50)
LDL Cholesterol (Calc): 73 mg/dL (calc)
Non-HDL Cholesterol (Calc): 84 mg/dL (calc) (ref <130)
Total CHOL/HDL Ratio: 2.4 (calc) (ref <5.0)
Triglycerides: 37 mg/dL (ref <150)

## 2018-05-07 ENCOUNTER — Other Ambulatory Visit: Payer: Self-pay

## 2018-05-07 ENCOUNTER — Encounter: Payer: Self-pay | Admitting: Internal Medicine

## 2018-05-07 ENCOUNTER — Ambulatory Visit (INDEPENDENT_AMBULATORY_CARE_PROVIDER_SITE_OTHER): Payer: Medicare HMO | Admitting: Internal Medicine

## 2018-05-07 VITALS — BP 122/70 | HR 80 | Temp 98.4°F | Ht 61.0 in | Wt 161.0 lb

## 2018-05-07 DIAGNOSIS — Z716 Tobacco abuse counseling: Secondary | ICD-10-CM | POA: Diagnosis not present

## 2018-05-07 DIAGNOSIS — I10 Essential (primary) hypertension: Secondary | ICD-10-CM | POA: Diagnosis not present

## 2018-05-07 DIAGNOSIS — E6609 Other obesity due to excess calories: Secondary | ICD-10-CM | POA: Diagnosis not present

## 2018-05-07 DIAGNOSIS — Z6830 Body mass index (BMI) 30.0-30.9, adult: Secondary | ICD-10-CM | POA: Diagnosis not present

## 2018-05-07 DIAGNOSIS — Z122 Encounter for screening for malignant neoplasm of respiratory organs: Secondary | ICD-10-CM | POA: Diagnosis not present

## 2018-05-07 DIAGNOSIS — Z0000 Encounter for general adult medical examination without abnormal findings: Secondary | ICD-10-CM | POA: Diagnosis not present

## 2018-05-07 DIAGNOSIS — F3341 Major depressive disorder, recurrent, in partial remission: Secondary | ICD-10-CM

## 2018-05-07 DIAGNOSIS — Z23 Encounter for immunization: Secondary | ICD-10-CM | POA: Diagnosis not present

## 2018-05-07 DIAGNOSIS — R69 Illness, unspecified: Secondary | ICD-10-CM | POA: Diagnosis not present

## 2018-05-07 DIAGNOSIS — I739 Peripheral vascular disease, unspecified: Secondary | ICD-10-CM

## 2018-05-07 NOTE — Progress Notes (Signed)
Provider:  Rexene Edison. Mariea Clonts, D.O., C.M.D. Location:   [place]   Place of Service:   clinic  Previous PCP: Gayland Curry, DO Patient Care Team: Gayland Curry, DO as PCP - General (Geriatric Medicine)  Extended Emergency Contact Information Primary Emergency Contact: Wade,Janice Address: [place], [place] Phone: [phone] Relation: Daughter Secondary Emergency Contact: [place]' Mobile Phone: [phone] Relation: Daughter  Goals of Care: Advanced Directive information Advanced Directives 05/05/2017  Does Patient Have a Medical Advance Directive? No  Would patient like information on creating a medical advance directive? No - Patient declined   Chief Complaint  Patient presents with  . Annual Exam    CPE    HPI: Patient is a 78 y.o. female seen today for an annual physical exam.    She continues to smoke cigarettes.  Needs screening CT chest., but she does not want to know if she has bad things wrong with her.   She is trying to quit smoking.  She opened a pack Sunday and it's Thursday now.  Only had 1.5 cigarettes Sunday and couldn't smoke in the car with her daughter.  She is trying .  She's cut down.  Trying to do other things instead like games on her phone.    Still going to the gym.    Has not changed her diet to explain her anemia.  Does not eat a lot of meats.  Likes veggies--does eat quite a bit of greens.  Has cereal for breakfast.  Rare taste for a steak.  She does eat a lot of chicken and [place].  Likes beets.  No blood in stool or dark stools.    Past Medical History:  Diagnosis Date  . Abnormality of gait 07/11/2005  . Allergy   . Anxiety state, unspecified 04/08/2012  . Asymptomatic varicose veins 09/24/2011  . Cataract    bilateral  . Chronic venous insufficiency 09/16/2012  . Degenerative and vascular disorders of ear, unspecified 08/25/2002  . Depressive disorder, not elsewhere  classified 11/10/2002  . Diverticulosis of colon (without mention of hemorrhage) 08/25/1989  . Edema 09/24/2011  . Esophageal reflux 11/10/2002   pt denies reflux  . First degree atrioventricular block 07/19/2008  . Heart murmur   . Hypertension   . Inguinal hernia without mention of obstruction or gangrene, unilateral or unspecified, (not specified as recurrent) 11/28/2006  . Insomnia, unspecified 12/15/2007  . Leiomyoma of uterus, unspecified 05/27/2006  . Leukocytopenia, unspecified 08/28/2010  . Lumbago 07/22/2007  . Neuromuscular disorder ([place])    cervical disc pain/radiating  . Obesity, unspecified 05/26/1996  . Osteoarthrosis, unspecified whether generalized or localized, unspecified site 05/26/2004  . Other abnormal blood chemistry 07/19/2008  . Other and unspecified hyperlipidemia 10/24/2004  . Palpitations 08/29/2007  . Peptic ulcer, unspecified site, unspecified as acute or chronic, without mention of hemorrhage, perforation, or obstruction 05/27/2006  . Scoliosis (and kyphoscoliosis), idiopathic 05/23/2009  . Symptomatic menopausal or female climacteric states 05/28/1990  . Unspecified essential hypertension 11/13/2004   Past Surgical History:  Procedure Laterality Date  . CHOLECYSTECTOMY  1971  . ENDOMETRIAL BIOPSY  03/1992   Dr. Mallie Mussel  . FOOT SURGERY Left 04/2003   [facility]- Dr. Caffie Pinto  . INGUINAL HERNIA REPAIR Right 03/2007   Dr. Georgette Dover  . KNEE ARTHROSCOPY Left 07/1997   Dr. Wonda Olds  . KNEE SURGERY Right 03/02/2004   Dr. Berenice Primas  . OOPHORECTOMY Right 1974  Dr. Amalia Hailey  . THUMB ARTHROSCOPY Right 1995   Dr.Sypher  . TONSILLECTOMY AND ADENOIDECTOMY  1975  . TUBAL LIGATION    . VESICOVAGINAL FISTULA CLOSURE W/ TAH  1975    reports that she has been smoking cigarettes. She has a 15.00 pack-year smoking history. She has never used smokeless tobacco. She reports that she does not drink alcohol or use drugs.  Functional Status Survey:  independent in all  activities  Family History  Problem Relation Age of Onset  . Kidney disease Mother   . Hypertension Mother   . Heart disease Father   . Diabetes Father   . Hypertension Sister   . Atrial fibrillation Daughter   . CVA Daughter   . Drug abuse Son   . Osteoporosis Daughter   . Hypertension Daughter   . Colon cancer Neg Hx   . Colon polyps Neg Hx   . Rectal cancer Neg Hx   . Stomach cancer Neg Hx   . Esophageal cancer Neg Hx     Health Maintenance  Topic Date Due  . PNA vac Low Risk Adult (2 of 2 - PPSV23) 11/04/2017  . Fecal DNA (Cologuard)  08/03/2018  . TETANUS/TDAP  03/05/2021  . INFLUENZA VACCINE  Completed  . DEXA SCAN  Completed    Allergies  Allergen Reactions  . Caffeine Other (See Comments)    Rapid heart rate   . Demerol [Meperidine]   . Venlafaxine Hcl Other (See Comments)    Hyper  . Pseudoephedrine Palpitations    Outpatient Encounter Medications as of 05/07/2018  Medication Sig  . aspirin 81 MG tablet Take 81 mg by mouth daily.   . cholecalciferol (VITAMIN D) 1000 units tablet Take 1,000 Units by mouth daily.  . cloNIDine (CATAPRES) 0.1 MG tablet TAKE 1 TABLET BY MOUTH TWICE A DAY TO CONTROL BLOOD PRESSURE  . clorazepate (TRANXENE) 7.5 MG tablet Take one tablet by mouth twice daily  . cyclobenzaprine (FLEXERIL) 5 MG tablet Take 1 tablet (5 mg total) by mouth at bedtime as needed for muscle spasms.  . fexofenadine (HM FEXOFENADINE HCL) 180 MG tablet Take 180 mg by mouth daily as needed.   . furosemide (LASIX) 40 MG tablet TAKE 1 TABLET BY MOUTH EVERY DAY  . GARLIC PO Take 1 tablet by mouth daily.   . hyoscyamine (LEVBID) 0.375 MG 12 hr tablet Take 0.375 mg 2 (two) times daily by mouth.  Marland Kitchen KRILL OIL PO Take by mouth daily.  Marland Kitchen losartan (COZAAR) 100 MG tablet TAKE 1 TABLET BY MOUTH EVERY DAY FOR BLOOD PRESSURE  . simvastatin (ZOCOR) 40 MG tablet TAKE 1 TABLET BY MOUTH EVERY DAY TO LOWER CHOLESTEROL  . [DISCONTINUED] varenicline (CHANTIX CONTINUING MONTH  PAK) 1 MG tablet Take 1 tablet (1 mg total) by mouth 2 (two) times daily.  . [DISCONTINUED] varenicline (CHANTIX STARTING MONTH PAK) 0.5 MG X 11 & 1 MG X 42 tablet Take one 0.5 mg tablet daily for 3 days, then one 0.5 mg tablet twice daily for 4 days, then one 1 mg tablet twice daily.   No facility-administered encounter medications on file as of 05/07/2018.     Review of Systems  Constitutional: Negative for chills, fever and malaise/fatigue.  HENT: Negative for congestion and hearing loss.   Eyes: Negative for blurred vision.       Glasses  Respiratory: Negative for cough, shortness of breath and wheezing.   Cardiovascular: Negative for chest pain, palpitations and leg swelling.  Gastrointestinal: Negative for  abdominal pain, blood in stool, constipation, diarrhea and melena.  Genitourinary: Negative for dysuria.  Musculoskeletal: Negative for falls and joint pain.  Skin: Negative for itching and rash.    Vitals:   05/07/18 1325  BP: 122/70  Pulse: 80  Temp: 98.4 F (36.9 C)  TempSrc: Oral  Weight: 161 lb (73 kg)  Height: 5\' 1"  (1.549 m)   Body mass index is 30.42 kg/m. Physical Exam Vitals signs reviewed.  Constitutional:      General: She is not in acute distress.    Appearance: Normal appearance. She is not ill-appearing or toxic-appearing.  HENT:     Head: Normocephalic and atraumatic.     Right Ear: Tympanic membrane, ear canal and external ear normal. There is no impacted cerumen.     Left Ear: Tympanic membrane, ear canal and external ear normal. There is no impacted cerumen.     Nose: Nose normal.     Mouth/Throat:     Pharynx: Oropharynx is clear.  Eyes:     Extraocular Movements: Extraocular movements intact.     Conjunctiva/sclera: Conjunctivae normal.     Pupils: Pupils are equal, round, and reactive to light.     Comments: glasses  Neck:     Musculoskeletal: Normal range of motion and neck supple.     Vascular: No carotid bruit.  Cardiovascular:      Rate and Rhythm: Normal rate and regular rhythm.     Pulses: Normal pulses.     Heart sounds: Normal heart sounds.  Pulmonary:     Effort: Pulmonary effort is normal.     Breath sounds: Normal breath sounds. No wheezing or rhonchi.  Chest:     Breasts:        Right: Normal. No swelling, bleeding, inverted nipple, mass, nipple discharge, skin change or tenderness.        Left: Normal. No swelling, bleeding, inverted nipple, mass, nipple discharge, skin change or tenderness.  Abdominal:     General: Bowel sounds are normal. There is no distension.     Palpations: Abdomen is soft. There is no mass.     Tenderness: There is no abdominal tenderness. There is no guarding or rebound.  Musculoskeletal: Normal range of motion.        General: No swelling, tenderness or deformity.     Right lower leg: No edema.     Left lower leg: No edema.  Lymphadenopathy:     Cervical: No cervical adenopathy.     Upper Body:     Right upper body: No supraclavicular or axillary adenopathy.     Left upper body: No supraclavicular or axillary adenopathy.  Skin:    General: Skin is warm and dry.     Capillary Refill: Capillary refill takes less than 2 seconds.  Neurological:     General: No focal deficit present.     Mental Status: She is alert and oriented to person, place, and time. Mental status is at baseline.     Cranial Nerves: No cranial nerve deficit.     Sensory: No sensory deficit.     Motor: No weakness.     Coordination: Coordination normal.     Gait: Gait normal.     Comments: Right patellar DTR absent, left 2+  Psychiatric:        Mood and Affect: Mood normal.        Behavior: Behavior normal.        Thought Content: Thought content normal.  Judgment: Judgment normal.     Labs reviewed: Basic Metabolic Panel: Recent Labs    11/03/17 0900 05/04/18 0805  NA 138 138  K 4.1 4.2  CL 100 103  CO2 31 30  GLUCOSE 84 90  BUN 11 17  CREATININE 0.95* 0.95*  CALCIUM 9.2 8.8    Liver Function Tests: Recent Labs    11/03/17 0900 05/04/18 0805  AST 16 15  ALT 10 12  BILITOT 0.4 0.4  PROT 6.4 6.4   No results for input(s): LIPASE, AMYLASE in the last 8760 hours. No results for input(s): AMMONIA in the last 8760 hours. CBC: Recent Labs    11/03/17 0900 05/04/18 0805  WBC 3.7* 3.9  NEUTROABS 1,654 1,283*  HGB 12.5 11.2*  HCT 37.5 33.6*  MCV 93.3 93.6  PLT 178 158   Cardiac Enzymes: No results for input(s): CKTOTAL, CKMB, CKMBINDEX, TROPONINI in the last 8760 hours. BNP: Invalid input(s): POCBNP No results found for: HGBA1C No results found for: TSH No results found for: VITAMINB12 No results found for: FOLATE No results found for: IRON, TIBC, FERRITIN Assessment/Plan 1. Essential hypertension - bp is well controlled despite her tobacco use - cont exercise program, low sodium diet - EKG 12-Lead today was sinus rhythm w/o acute ischemia or infarct  2. Body mass index (BMI) of 30.0-30.9 in adult - encouraged healthier diet choices along with her exercise regimen - Lipid panel; Future  3. Class 1 obesity due to excess calories without serious comorbidity with body mass index (BMI) of 30.0 to 30.9 in adult -counseled on diet and exercise - Lipid panel; Future  4. Recurrent major depressive disorder, in partial remission ([place]) -seems to be doing better at present despite loss of close relatives in the recent past -cont same regimen and regular exercise  5. PVD (peripheral vascular disease) ([place]) - educated on risks of heart attack and stroke with her ongoing tobacco abuse--she has worked in health care and aware - CBC with Differential/Platelet; Future - Lipid panel; Future  6. Encounter for screening for malignant neoplasm of respiratory organs -could not convince her to get screening CT chest per guidelines -says she will stop smoking and didn't seem to believe me that she may not have symptoms if she had a nodule  7. Annual physical  exam -performed today  -agreed to get second pneumonia vaccine since last given before she was 63, it appears since prevnar was not given to older adults in 2004 as recorded in chart  8. Need for 23-polyvalent pneumococcal polysaccharide vaccine - Pneumococcal polysaccharide vaccine 23-valent greater than or equal to 2yo subcutaneous/IM given today  9.  Tobacco use counseling:  More than 3 mins spent on smoking cessation counseling--risks on ongoing smoking, benefits of quitting and getting her story about how she started smoking, how she's started to wean herself thus far  Labs/tests ordered:   Orders Placed This Encounter  Procedures  . Pneumococcal polysaccharide vaccine 23-valent greater than or equal to 2yo subcutaneous/IM  . CBC with Differential/Platelet    Standing Status:   Future    Standing Expiration Date:   05/07/2019  . Lipid panel    Standing Status:   Future    Standing Expiration Date:   05/07/2019  . EKG 12-Lead     Brynli Ollis L. Kadrian Partch, D.O. [facility] [address], [place] [address] Cell Phone (Mon-Fri 8am-5pm):  [phone] On Call:  [phone] & follow prompts after 5pm & weekends  Office Phone:  [phone] Office Fax:  [phone]

## 2018-05-18 ENCOUNTER — Other Ambulatory Visit: Payer: Self-pay | Admitting: Internal Medicine

## 2018-05-26 ENCOUNTER — Other Ambulatory Visit: Payer: Self-pay | Admitting: *Deleted

## 2018-05-26 DIAGNOSIS — F418 Other specified anxiety disorders: Secondary | ICD-10-CM

## 2018-05-26 MED ORDER — CLORAZEPATE DIPOTASSIUM 7.5 MG PO TABS: ORAL_TABLET | ORAL | 0 refills | Status: DC

## 2018-05-26 NOTE — Telephone Encounter (Signed)
Patient requested refill

## 2018-06-01 ENCOUNTER — Other Ambulatory Visit: Payer: Self-pay | Admitting: Internal Medicine

## 2018-06-01 DIAGNOSIS — I10 Essential (primary) hypertension: Secondary | ICD-10-CM

## 2018-06-01 NOTE — Telephone Encounter (Signed)
Patient requesting refill for Clonidine 0.1 mg tab. Allergy alert is medium. Routing medication to provider for further review.

## 2018-06-24 ENCOUNTER — Other Ambulatory Visit: Payer: Self-pay | Admitting: *Deleted

## 2018-06-24 DIAGNOSIS — F418 Other specified anxiety disorders: Secondary | ICD-10-CM

## 2018-06-24 MED ORDER — CLORAZEPATE DIPOTASSIUM 7.5 MG PO TABS: ORAL_TABLET | ORAL | 0 refills | Status: DC

## 2018-06-24 NOTE — Telephone Encounter (Signed)
Patient requested refill. Phoned to pharmacy.  

## 2018-07-24 ENCOUNTER — Other Ambulatory Visit: Payer: Self-pay | Admitting: *Deleted

## 2018-07-24 DIAGNOSIS — F418 Other specified anxiety disorders: Secondary | ICD-10-CM

## 2018-07-24 MED ORDER — CLORAZEPATE DIPOTASSIUM 7.5 MG PO TABS: ORAL_TABLET | ORAL | 0 refills | Status: DC

## 2018-07-24 NOTE — Telephone Encounter (Signed)
Patient requested refill. Phoned to pharmacy.  

## 2018-08-21 ENCOUNTER — Telehealth: Payer: Self-pay | Admitting: Internal Medicine

## 2018-08-21 DIAGNOSIS — F418 Other specified anxiety disorders: Secondary | ICD-10-CM

## 2018-08-21 MED ORDER — CLORAZEPATE DIPOTASSIUM 7.5 MG PO TABS: ORAL_TABLET | ORAL | 0 refills | Status: DC

## 2018-08-21 NOTE — Telephone Encounter (Signed)
Refill called to pharmacy.

## 2018-08-21 NOTE — Telephone Encounter (Signed)
Patient request prescription for Clorazepape 7.5 mg.  She uses [facility] on [address].

## 2018-09-04 ENCOUNTER — Other Ambulatory Visit: Payer: Self-pay | Admitting: Internal Medicine

## 2018-09-04 DIAGNOSIS — I10 Essential (primary) hypertension: Secondary | ICD-10-CM

## 2018-09-25 ENCOUNTER — Other Ambulatory Visit: Payer: Self-pay | Admitting: Internal Medicine

## 2018-09-25 ENCOUNTER — Other Ambulatory Visit: Payer: Self-pay

## 2018-09-25 DIAGNOSIS — F418 Other specified anxiety disorders: Secondary | ICD-10-CM

## 2018-09-25 DIAGNOSIS — I10 Essential (primary) hypertension: Secondary | ICD-10-CM

## 2018-09-25 MED ORDER — CLORAZEPATE DIPOTASSIUM 7.5 MG PO TABS: ORAL_TABLET | ORAL | 3 refills | Status: DC

## 2018-09-25 NOTE — Telephone Encounter (Signed)
Patient called clinical intake requesting a refill on her clorazepate  RX last filled in Epic on 08/21/2018, RX forwarded to [employer], DO to review [place] Database and approval if necessary.

## 2018-09-25 NOTE — Telephone Encounter (Signed)
Patient aware rx approved

## 2018-10-26 ENCOUNTER — Other Ambulatory Visit: Payer: Self-pay | Admitting: Internal Medicine

## 2018-10-26 DIAGNOSIS — I10 Essential (primary) hypertension: Secondary | ICD-10-CM

## 2018-11-25 DIAGNOSIS — R69 Illness, unspecified: Secondary | ICD-10-CM | POA: Diagnosis not present

## 2018-11-30 ENCOUNTER — Encounter: Payer: Self-pay | Admitting: Internal Medicine

## 2018-11-30 ENCOUNTER — Other Ambulatory Visit: Payer: Self-pay

## 2018-11-30 ENCOUNTER — Ambulatory Visit (INDEPENDENT_AMBULATORY_CARE_PROVIDER_SITE_OTHER): Payer: Medicare HMO | Admitting: Internal Medicine

## 2018-11-30 VITALS — BP 130/78 | HR 71 | Temp 97.6°F | Resp 20 | Ht 61.0 in | Wt 159.6 lb

## 2018-11-30 DIAGNOSIS — Z6830 Body mass index (BMI) 30.0-30.9, adult: Secondary | ICD-10-CM

## 2018-11-30 DIAGNOSIS — F3341 Major depressive disorder, recurrent, in partial remission: Secondary | ICD-10-CM | POA: Diagnosis not present

## 2018-11-30 DIAGNOSIS — Z1231 Encounter for screening mammogram for malignant neoplasm of breast: Secondary | ICD-10-CM

## 2018-11-30 DIAGNOSIS — R739 Hyperglycemia, unspecified: Secondary | ICD-10-CM

## 2018-11-30 DIAGNOSIS — M545 Low back pain, unspecified: Secondary | ICD-10-CM

## 2018-11-30 DIAGNOSIS — E6609 Other obesity due to excess calories: Secondary | ICD-10-CM | POA: Diagnosis not present

## 2018-11-30 DIAGNOSIS — K5901 Slow transit constipation: Secondary | ICD-10-CM

## 2018-11-30 DIAGNOSIS — R69 Illness, unspecified: Secondary | ICD-10-CM | POA: Diagnosis not present

## 2018-11-30 DIAGNOSIS — Z716 Tobacco abuse counseling: Secondary | ICD-10-CM

## 2018-11-30 DIAGNOSIS — I10 Essential (primary) hypertension: Secondary | ICD-10-CM | POA: Diagnosis not present

## 2018-11-30 DIAGNOSIS — G8929 Other chronic pain: Secondary | ICD-10-CM

## 2018-11-30 DIAGNOSIS — E2839 Other primary ovarian failure: Secondary | ICD-10-CM | POA: Diagnosis not present

## 2018-11-30 DIAGNOSIS — J302 Other seasonal allergic rhinitis: Secondary | ICD-10-CM | POA: Diagnosis not present

## 2018-11-30 DIAGNOSIS — I739 Peripheral vascular disease, unspecified: Secondary | ICD-10-CM | POA: Diagnosis not present

## 2018-11-30 NOTE — Progress Notes (Signed)
Location:  [facility]   Advanced Directives 05/05/2017  Does Patient Have a Medical Advance Directive? No  Would patient like information on creating a medical advance directive? No - Patient declined     Chief Complaint  Patient presents with  . Medical Management of Chronic Issues    6 mo f/u    HPI: [name] seen today for medical management of chronic diseases.    She is doing well. No new complaints today. Spends time with daughter daily. Involved with her church and volunteering. Practices social distancing and wears mask when going out.   Eats two meals a day. Drinks about 4- 16oz glasses of water daily. Increased vegetables daily to help with bowel movements. LBM today. Takes metamucil daily. Admits to eating sweets daily, tries to eat dark chocolate.   Walks at the mall 5 times a week for at least 45 minutes with her daughter.   Had eyes examined this year. Told she has cataracts, but does not need surgery yet.   Continues to smoke cigarettes. Smokes 6-7 cigarettes daily. Does not want CT scan of lungs. Does not want to quit smoking at this times.   Takes metamucil daily. LBM today.   Has had a chronic history of anxiety in the morning since husband passed 7 years ago. She claims it is stable and has not gotten worse. She is able to go back to sleep when anxiety occurs. Walking helps with her anxiety and also smoking a few times a day.   Interested in getting a bone density test with her next mammogram.   Seasonal allergies have come back this past month. Takes 1/2 Allegra daily for allergies and uses saline spray. She does not think it helps.   Fasting for labs today, has only had black coffee.   Has received her flu vaccination prior to visit.               Past Medical History:  Diagnosis Date  . Abnormality of gait 07/11/2005  . Allergy   . Anxiety state, unspecified 04/08/2012  . Asymptomatic varicose veins 09/24/2011  .  Cataract    bilateral  . Chronic venous insufficiency 09/16/2012  . Degenerative and vascular disorders of ear, unspecified 08/25/2002  . Depressive disorder, not elsewhere classified 11/10/2002  . Diverticulosis of colon (without mention of hemorrhage) 08/25/1989  . Edema 09/24/2011  . Esophageal reflux 11/10/2002   pt denies reflux  . First degree atrioventricular block 07/19/2008  . Heart murmur   . Hypertension   . Inguinal hernia without mention of obstruction or gangrene, unilateral or unspecified, (not specified as recurrent) 11/28/2006  . Insomnia, unspecified 12/15/2007  . Leiomyoma of uterus, unspecified 05/27/2006  . Leukocytopenia, unspecified 08/28/2010  . Lumbago 07/22/2007  . Neuromuscular disorder ([place])    cervical disc pain/radiating  . Obesity, unspecified 05/26/1996  . Osteoarthrosis, unspecified whether generalized or localized, unspecified site 05/26/2004  . Other abnormal blood chemistry 07/19/2008  . Other and unspecified hyperlipidemia 10/24/2004  . Palpitations 08/29/2007  . Peptic ulcer, unspecified site, unspecified as acute or chronic, without mention of hemorrhage, perforation, or obstruction 05/27/2006  . Scoliosis (and kyphoscoliosis), idiopathic 05/23/2009  . Symptomatic menopausal or female climacteric states 05/28/1990  . Unspecified essential hypertension 11/13/2004    Past Surgical History:  Procedure Laterality Date  . CHOLECYSTECTOMY  1971  . ENDOMETRIAL BIOPSY  03/1992   Dr. Mallie Mussel  . FOOT SURGERY Left 04/2003   [facility]- Dr. Caffie Pinto  .  INGUINAL HERNIA REPAIR Right 03/2007   Dr. Georgette Dover  . KNEE ARTHROSCOPY Left 07/1997   Dr. Wonda Olds  . KNEE SURGERY Right 03/02/2004   Dr. Berenice Primas  . OOPHORECTOMY Right 1974   Dr. Amalia Hailey  . THUMB ARTHROSCOPY Right 1995   Dr.Sypher  . TONSILLECTOMY AND ADENOIDECTOMY  1975  . TUBAL LIGATION    . VESICOVAGINAL FISTULA CLOSURE W/ TAH  1975    Allergies  Allergen Reactions  . Caffeine Other (See  Comments)    Rapid heart rate   . Demerol [Meperidine]   . Venlafaxine Hcl Other (See Comments)    Hyper  . Pseudoephedrine Palpitations    Outpatient Encounter Medications as of 11/30/2018  Medication Sig  . aspirin 81 MG tablet Take 81 mg by mouth daily.   . cholecalciferol (VITAMIN D) 1000 units tablet Take 1,000 Units by mouth daily.  . cloNIDine (CATAPRES) 0.1 MG tablet TAKE 1 TABLET BY MOUTH TWICE A DAY TO CONTROL BLOOD PRESSURE  . clorazepate (TRANXENE) 7.5 MG tablet Take one tablet by mouth twice daily  . cyclobenzaprine (FLEXERIL) 5 MG tablet Take 1 tablet (5 mg total) by mouth at bedtime as needed for muscle spasms.  . fexofenadine (HM FEXOFENADINE HCL) 180 MG tablet Take 180 mg by mouth daily as needed.   . furosemide (LASIX) 40 MG tablet TAKE 1 TABLET BY MOUTH EVERY DAY  . GARLIC PO Take 1 tablet by mouth daily.   . hyoscyamine (LEVBID) 0.375 MG 12 hr tablet Take 0.375 mg 2 (two) times daily by mouth.  Marland Kitchen KRILL OIL PO Take by mouth daily.  Marland Kitchen losartan (COZAAR) 100 MG tablet TAKE 1 TABLET BY MOUTH EVERY DAY FOR BLOOD PRESSURE  . Psyllium-Calcium (METAMUCIL PLUS CALCIUM) CAPS Take 3 capsules by mouth 3 (three) times daily.  . simvastatin (ZOCOR) 40 MG tablet TAKE 1 TABLET BY MOUTH EVERY DAY TO LOWER CHOLESTEROL   No facility-administered encounter medications on file as of 11/30/2018.     Review of Systems:  Review of Systems  Constitutional: Negative for activity change, appetite change and fatigue.  HENT: Negative for dental problem, hearing loss and trouble swallowing.        Dentures  Eyes: Negative for photophobia and visual disturbance.  Respiratory: Negative for cough, shortness of breath and wheezing.   Cardiovascular: Negative for chest pain, palpitations and leg swelling.  Gastrointestinal: Negative for abdominal pain, constipation, diarrhea and nausea.  Endocrine: Negative for polydipsia, polyphagia and polyuria.  Genitourinary: Positive for frequency. Negative  for dysuria, hematuria and vaginal bleeding.  Musculoskeletal: Positive for arthralgias. Negative for myalgias.       Right shoulder pain  Skin: Negative.   Neurological: Negative for dizziness, tremors and headaches.  Psychiatric/Behavioral: Negative for dysphoric mood and sleep disturbance. The patient is not nervous/anxious.     Health Maintenance  Topic Date Due  . TETANUS/TDAP  03/05/2021  . INFLUENZA VACCINE  Completed  . DEXA SCAN  Completed  . PNA vac Low Risk Adult  Completed    Physical Exam: Vitals:   11/30/18 1312  BP: 130/78  Pulse: 71  Resp: 20  Temp: 97.6 F (36.4 C)  SpO2: 95%  Weight: 159 lb 9.6 oz (72.4 kg)  Height: 5\' 1"  (1.549 m)   Body mass index is 30.16 kg/m. Physical Exam Vitals signs reviewed.  Constitutional:      Appearance: Normal appearance. She is normal weight.  HENT:     Head: Normocephalic.  Cardiovascular:     Rate and Rhythm:  Normal rate and regular rhythm.     Pulses: Normal pulses.     Heart sounds: Normal heart sounds. No murmur.  Pulmonary:     Effort: Pulmonary effort is normal. No respiratory distress.     Breath sounds: Normal breath sounds. No wheezing.  Abdominal:     General: Abdomen is flat. Bowel sounds are normal.     Palpations: Abdomen is soft.  Musculoskeletal: Normal range of motion.     Right lower leg: No edema.     Left lower leg: No edema.  Skin:    General: Skin is warm and dry.     Capillary Refill: Capillary refill takes less than 2 seconds.  Neurological:     General: No focal deficit present.     Mental Status: She is alert and oriented to person, place, and time. Mental status is at baseline.  Psychiatric:        Mood and Affect: Mood normal.        Behavior: Behavior normal.        Thought Content: Thought content normal.        Judgment: Judgment normal.     Labs reviewed: Basic Metabolic Panel: Recent Labs    05/04/18 0805  NA 138  K 4.2  CL 103  CO2 30  GLUCOSE 90  BUN 17   CREATININE 0.95*  CALCIUM 8.8   Liver Function Tests: Recent Labs    05/04/18 0805  AST 15  ALT 12  BILITOT 0.4  PROT 6.4   No results for input(s): LIPASE, AMYLASE in the last 8760 hours. No results for input(s): AMMONIA in the last 8760 hours. CBC: Recent Labs    05/04/18 0805  WBC 3.9  NEUTROABS 1,283*  HGB 11.2*  HCT 33.6*  MCV 93.6  PLT 158   Lipid Panel: Recent Labs    05/04/18 0805  CHOL 143  HDL 59  LDLCALC 73  TRIG 37  CHOLHDL 2.4   No results found for: HGBA1C  Procedures since last visit: No results found.  Assessment/Plan 1. Essential hypertension - bp at goal of <150/90 - continue current medication regimen - continue to follow a DASH diet - complete blood count with differential/platelets- today - complete metabolic panel with GFR- today - lipid panel- today  2. Body mass index (BMI) of 30.0-30.9 in adult - she has lost 2 pounds since last visit - Encourage current exercise regimen - recommend portion size and counting calories - hemoglobin A1C- today  3. Class 1 obesity due to excess calories without serious comorbidity with body mass index (BMI) of 30.0 to 30.9 in adult - same as above  4. Tobacco abuse counseling - recommend smoking cessation, discussed with patient - patient states they do not have desire to quit smoking at this time  5. Recurrent major depressive disorder, in partial remission ([place]) - stable in remission  6. Encounter for screening for malignant neoplasm of respiratory organs - prolonged smoking puts her at high risk of lung cancer - high risk has been discussed with patient, she refuses to have low-dose CT scan to screen for lung cancer  7. PVD (peripheral vascular disease) - stable, no progression  8. Constipation - having normal bowel movements  - increased intake of vegetables daily - continue to take metamucil daily  9. Anxiety disorder, unspecified - reports anxiety in the early morning has  occurred for past few years- has not progressed - she is able to go back asleep after anxiety episode -  encourage continued exercise to help cope with anxiety - recommend contacting PCP if anxiety progresses  Labs/tests ordered:  Complete blood count with differential/platelets, complete metabolic count with GFR, hemoglobin A1C, lipid panel  Bone density scan in February 2021 with mammogram- please schedule together  Next appt:  Follow up in 6 months

## 2018-12-01 LAB — CBC WITH DIFFERENTIAL/PLATELET
Absolute Monocytes: 414 cells/uL (ref 200–950)
Basophils Absolute: 41 cells/uL (ref 0–200)
Basophils Relative: 1.1 %
Eosinophils Absolute: 137 cells/uL (ref 15–500)
Eosinophils Relative: 3.7 %
HCT: 36.5 % (ref 35.0–45.0)
Hemoglobin: 11.8 g/dL (ref 11.7–15.5)
Lymphs Abs: 1473 cells/uL (ref 850–3900)
MCH: 30.1 pg (ref 27.0–33.0)
MCHC: 32.3 g/dL (ref 32.0–36.0)
MCV: 93.1 fL (ref 80.0–100.0)
MPV: 11 fL (ref 7.5–12.5)
Monocytes Relative: 11.2 %
Neutro Abs: 1635 cells/uL (ref 1500–7800)
Neutrophils Relative %: 44.2 %
Platelets: 174 10*3/uL (ref 140–400)
RBC: 3.92 10*6/uL (ref 3.80–5.10)
RDW: 11.6 % (ref 11.0–15.0)
Total Lymphocyte: 39.8 %
WBC: 3.7 10*3/uL — ABNORMAL LOW (ref 3.8–10.8)

## 2018-12-01 LAB — LIPID PANEL
Cholesterol: 128 mg/dL (ref <200)
HDL: 52 mg/dL (ref >=50)
LDL Cholesterol (Calc): 63 mg/dL (calc)
Non-HDL Cholesterol (Calc): 76 mg/dL (calc) (ref <130)
Total CHOL/HDL Ratio: 2.5 (calc) (ref <5.0)
Triglycerides: 54 mg/dL (ref <150)

## 2018-12-01 LAB — HEMOGLOBIN A1C
Hgb A1c MFr Bld: 5.3 % of total Hgb (ref <5.7)
Mean Plasma Glucose: 105 (calc)
eAG (mmol/L): 5.8 (calc)

## 2019-01-23 ENCOUNTER — Other Ambulatory Visit: Payer: Self-pay | Admitting: Internal Medicine

## 2019-01-23 DIAGNOSIS — F418 Other specified anxiety disorders: Secondary | ICD-10-CM

## 2019-02-10 DIAGNOSIS — Z823 Family history of stroke: Secondary | ICD-10-CM | POA: Diagnosis not present

## 2019-02-10 DIAGNOSIS — R69 Illness, unspecified: Secondary | ICD-10-CM | POA: Diagnosis not present

## 2019-02-10 DIAGNOSIS — R609 Edema, unspecified: Secondary | ICD-10-CM | POA: Diagnosis not present

## 2019-02-10 DIAGNOSIS — K5900 Constipation, unspecified: Secondary | ICD-10-CM | POA: Diagnosis not present

## 2019-02-10 DIAGNOSIS — Z7982 Long term (current) use of aspirin: Secondary | ICD-10-CM | POA: Diagnosis not present

## 2019-02-10 DIAGNOSIS — Z604 Social exclusion and rejection: Secondary | ICD-10-CM | POA: Diagnosis not present

## 2019-02-10 DIAGNOSIS — I10 Essential (primary) hypertension: Secondary | ICD-10-CM | POA: Diagnosis not present

## 2019-02-10 DIAGNOSIS — E785 Hyperlipidemia, unspecified: Secondary | ICD-10-CM | POA: Diagnosis not present

## 2019-02-10 DIAGNOSIS — Z720 Tobacco use: Secondary | ICD-10-CM | POA: Diagnosis not present

## 2019-02-10 DIAGNOSIS — G8929 Other chronic pain: Secondary | ICD-10-CM | POA: Diagnosis not present

## 2019-03-05 ENCOUNTER — Other Ambulatory Visit: Payer: Self-pay | Admitting: Internal Medicine

## 2019-03-05 DIAGNOSIS — I10 Essential (primary) hypertension: Secondary | ICD-10-CM

## 2019-04-22 ENCOUNTER — Other Ambulatory Visit: Payer: Self-pay | Admitting: Internal Medicine

## 2019-04-22 DIAGNOSIS — I10 Essential (primary) hypertension: Secondary | ICD-10-CM

## 2019-05-10 ENCOUNTER — Ambulatory Visit (INDEPENDENT_AMBULATORY_CARE_PROVIDER_SITE_OTHER): Payer: Medicare HMO | Admitting: Family

## 2019-05-10 ENCOUNTER — Encounter: Payer: Self-pay | Admitting: Family

## 2019-05-10 ENCOUNTER — Other Ambulatory Visit: Payer: Self-pay

## 2019-05-10 DIAGNOSIS — Z0000 Encounter for general adult medical examination without abnormal findings: Secondary | ICD-10-CM | POA: Diagnosis not present

## 2019-05-10 NOTE — Patient Instructions (Signed)
[name] , Thank you for taking time to come for your Medicare Wellness Visit. I appreciate your ongoing commitment to your health goals. Please review the following plan we discussed and let me know if I can assist you in the future.   Screening recommendations/referrals: Colonoscopy: Up to date  Mammogram  : Up to date  Bone Density : Up to date  Recommended yearly ophthalmology/optometry visit for glaucoma screening and checkup Recommended yearly dental visit for hygiene and checkup  Vaccinations: Influenza vaccine : Up to date  Pneumococcal vaccine: Up to date  Tdap vaccine : Up to date  Shingles vaccine: Please get your shingrix vaccine at your Pharmacy    Advanced directives: No   Conditions/risks identified: Advance age female > 16 yrs,Hypertension,dyslipidemia,Obesity BMI > 30,hx smoking   Next appointment: 1 year    Preventive Care 65 Years and Older, Female Preventive care refers to lifestyle choices and visits with your health care provider that can promote health and wellness. What does preventive care include?  A yearly physical exam. This is also called an annual well check.  Dental exams once or twice a year.  Routine eye exams. Ask your health care provider how often you should have your eyes checked.  Personal lifestyle choices, including:  Daily care of your teeth and gums.  Regular physical activity.  Eating a healthy diet.  Avoiding tobacco and drug use.  Limiting alcohol use.  Practicing safe sex.  Taking low-dose aspirin every day.  Taking vitamin and mineral supplements as recommended by your health care provider. What happens during an annual well check? The services and screenings done by your health care provider during your annual well check will depend on your age, overall health, lifestyle risk factors, and family history of disease. Counseling  Your health care provider may ask you questions about your:  Alcohol use.  Tobacco  use.  Drug use.  Emotional well-being.  Home and relationship well-being.  Sexual activity.  Eating habits.  History of falls.  Memory and ability to understand (cognition).  Work and work Statistician.  Reproductive health. Screening  You may have the following tests or measurements:  Height, weight, and BMI.  Blood pressure.  Lipid and cholesterol levels. These may be checked every 5 years, or more frequently if you are over 57 years old.  Skin check.  Lung cancer screening. You may have this screening every year starting at age 42 if you have a 30-pack-year history of smoking and currently smoke or have quit within the past 15 years.  Fecal occult blood test (FOBT) of the stool. You may have this test every year starting at age 29.  Flexible sigmoidoscopy or colonoscopy. You may have a sigmoidoscopy every 5 years or a colonoscopy every 10 years starting at age 57.  Hepatitis C blood test.  Hepatitis B blood test.  Sexually transmitted disease (STD) testing.  Diabetes screening. This is done by checking your blood sugar (glucose) after you have not eaten for a while (fasting). You may have this done every 1-3 years.  Bone density scan. This is done to screen for osteoporosis. You may have this done starting at age 47.  Mammogram. This may be done every 1-2 years. Talk to your health care provider about how often you should have regular mammograms. Talk with your health care provider about your test results, treatment options, and if necessary, the need for more tests. Vaccines  Your health care provider may recommend certain vaccines, such as:  Influenza vaccine. This is recommended every year.  Tetanus, diphtheria, and acellular pertussis (Tdap, Td) vaccine. You may need a Td booster every 10 years.  Zoster vaccine. You may need this after age 20.  Pneumococcal 13-valent conjugate (PCV13) vaccine. One dose is recommended after age 81.  Pneumococcal  polysaccharide (PPSV23) vaccine. One dose is recommended after age 44. Talk to your health care provider about which screenings and vaccines you need and how often you need them. This information is not intended to replace advice given to you by your health care provider. Make sure you discuss any questions you have with your health care provider. Document Released: 03/10/2015 Document Revised: 11/01/2015 Document Reviewed: 12/13/2014 Elsevier Interactive Patient Education  2017 [place] Prevention in the Home Falls can cause injuries. They can happen to people of all ages. There are many things you can do to make your home safe and to help prevent falls. What can I do on the outside of my home?  Regularly fix the edges of walkways and driveways and fix any cracks.  Remove anything that might make you trip as you walk through a door, such as a raised step or threshold.  Trim any bushes or trees on the path to your home.  Use bright outdoor lighting.  Clear any walking paths of anything that might make someone trip, such as rocks or tools.  Regularly check to see if handrails are loose or broken. Make sure that both sides of any steps have handrails.  Any raised decks and porches should have guardrails on the edges.  Have any leaves, snow, or ice cleared regularly.  Use sand or salt on walking paths during winter.  Clean up any spills in your garage right away. This includes oil or grease spills. What can I do in the bathroom?  Use night lights.  Install grab bars by the toilet and in the tub and shower. Do not use towel bars as grab bars.  Use non-skid mats or decals in the tub or shower.  If you need to sit down in the shower, use a plastic, non-slip stool.  Keep the floor dry. Clean up any water that spills on the floor as soon as it happens.  Remove soap buildup in the tub or shower regularly.  Attach bath mats securely with double-sided non-slip rug  tape.  Do not have throw rugs and other things on the floor that can make you trip. What can I do in the bedroom?  Use night lights.  Make sure that you have a light by your bed that is easy to reach.  Do not use any sheets or blankets that are too big for your bed. They should not hang down onto the floor.  Have a firm chair that has side arms. You can use this for support while you get dressed.  Do not have throw rugs and other things on the floor that can make you trip. What can I do in the kitchen?  Clean up any spills right away.  Avoid walking on wet floors.  Keep items that you use a lot in easy-to-reach places.  If you need to reach something above you, use a strong step stool that has a grab bar.  Keep electrical cords out of the way.  Do not use floor polish or wax that makes floors slippery. If you must use wax, use non-skid floor wax.  Do not have throw rugs and other things on the floor that  can make you trip. What can I do with my stairs?  Do not leave any items on the stairs.  Make sure that there are handrails on both sides of the stairs and use them. Fix handrails that are broken or loose. Make sure that handrails are as long as the stairways.  Check any carpeting to make sure that it is firmly attached to the stairs. Fix any carpet that is loose or worn.  Avoid having throw rugs at the top or bottom of the stairs. If you do have throw rugs, attach them to the floor with carpet tape.  Make sure that you have a light switch at the top of the stairs and the bottom of the stairs. If you do not have them, ask someone to add them for you. What else can I do to help prevent falls?  Wear shoes that:  Do not have high heels.  Have rubber bottoms.  Are comfortable and fit you well.  Are closed at the toe. Do not wear sandals.  If you use a stepladder:  Make sure that it is fully opened. Do not climb a closed stepladder.  Make sure that both sides of the  stepladder are locked into place.  Ask someone to hold it for you, if possible.  Clearly mark and make sure that you can see:  Any grab bars or handrails.  First and last steps.  Where the edge of each step is.  Use tools that help you move around (mobility aids) if they are needed. These include:  Canes.  Walkers.  Scooters.  Crutches.  Turn on the lights when you go into a dark area. Replace any light bulbs as soon as they burn out.  Set up your furniture so you have a clear path. Avoid moving your furniture around.  If any of your floors are uneven, fix them.  If there are any pets around you, be aware of where they are.  Review your medicines with your doctor. Some medicines can make you feel dizzy. This can increase your chance of falling. Ask your doctor what other things that you can do to help prevent falls. This information is not intended to replace advice given to you by your health care provider. Make sure you discuss any questions you have with your health care provider. Document Released: 12/08/2008 Document Revised: 07/20/2015 Document Reviewed: 03/18/2014 Elsevier Interactive Patient Education  2017 [employer].

## 2019-05-10 NOTE — Progress Notes (Signed)
Subjective:   Felicia Sawyer is a 79 y.o. female who presents for Medicare Annual (Subsequent) preventive examination.  Review of Systems:  Cardiac Risk Factors include: advanced age (>15men, >41 women);hypertension;dyslipidemia;obesity (BMI >30kg/m2);smoking/ tobacco exposure     Objective:     Vitals: There were no vitals taken for this visit.  There is no height or weight on file to calculate BMI.  Advanced Directives 05/10/2019 05/05/2017 11/04/2016 07/02/2016 02/06/2016 10/11/2015 07/25/2015  Does Patient Have a Medical Advance Directive? No No No No No No No  Would patient like information on creating a medical advance directive? No - Patient declined No - Patient declined No - Patient declined - - - -    Tobacco Social History   Tobacco Use  Smoking Status Current Every Day Smoker  . Packs/day: 0.25  . Years: 60.00  . Pack years: 15.00  . Types: Cigarettes  Smokeless Tobacco Never Used  Tobacco Comment   5 a day      Ready to quit: Not Answered Counseling given: Not Answered Comment: 5 a day    Clinical Intake:  Pre-visit preparation completed: No  Pain : 0-10 Pain Score: 4  Pain Type: Chronic pain Pain Location: Neck Pain Orientation: Other (Comment)(back of the neck) Pain Radiating Towards: No Pain Descriptors / Indicators: Other (Comment)("uncomfortable") Pain Onset: (several years ago) Pain Frequency: Constant Pain Relieving Factors: warm compressor tents unit Effect of Pain on Daily Activities: No  Pain Relieving Factors: warm compressor tents unit  BMI - recorded: 30.16 Nutritional Status: BMI > 30  Obese Nutritional Risks: None Diabetes: No  How often do you need to have someone help you when you read instructions, pamphlets, or other written materials from your doctor or pharmacy?: 1 - Never What is the last grade level you completed in school?: 12 Grade  Interpreter Needed?: No  Information entered by :: Tyana Butzer FNP-C  Past  Medical History:  Diagnosis Date  . Abnormality of gait 07/11/2005  . Allergy   . Anxiety state, unspecified 04/08/2012  . Asymptomatic varicose veins 09/24/2011  . Cataract    bilateral  . Chronic venous insufficiency 09/16/2012  . Degenerative and vascular disorders of ear, unspecified 08/25/2002  . Depressive disorder, not elsewhere classified 11/10/2002  . Diverticulosis of colon (without mention of hemorrhage) 08/25/1989  . Edema 09/24/2011  . Esophageal reflux 11/10/2002   pt denies reflux  . First degree atrioventricular block 07/19/2008  . Heart murmur   . Hypertension   . Inguinal hernia without mention of obstruction or gangrene, unilateral or unspecified, (not specified as recurrent) 11/28/2006  . Insomnia, unspecified 12/15/2007  . Leiomyoma of uterus, unspecified 05/27/2006  . Leukocytopenia, unspecified 08/28/2010  . Lumbago 07/22/2007  . Neuromuscular disorder ([place])    cervical disc pain/radiating  . Obesity, unspecified 05/26/1996  . Osteoarthrosis, unspecified whether generalized or localized, unspecified site 05/26/2004  . Other abnormal blood chemistry 07/19/2008  . Other and unspecified hyperlipidemia 10/24/2004  . Palpitations 08/29/2007  . Peptic ulcer, unspecified site, unspecified as acute or chronic, without mention of hemorrhage, perforation, or obstruction 05/27/2006  . Scoliosis (and kyphoscoliosis), idiopathic 05/23/2009  . Symptomatic menopausal or female climacteric states 05/28/1990  . Unspecified essential hypertension 11/13/2004   Past Surgical History:  Procedure Laterality Date  . CHOLECYSTECTOMY  1971  . ENDOMETRIAL BIOPSY  03/1992   Dr. Mallie Mussel  . FOOT SURGERY Left 04/2003   [facility]- Dr. Caffie Pinto  . INGUINAL HERNIA REPAIR Right 03/2007   Dr. Georgette Dover  .  KNEE ARTHROSCOPY Left 07/1997   Dr. Wonda Olds  . KNEE SURGERY Right 03/02/2004   Dr. Berenice Primas  . OOPHORECTOMY Right 1974   Dr. Amalia Hailey  . THUMB ARTHROSCOPY Right 1995   Dr.Sypher  .  TONSILLECTOMY AND ADENOIDECTOMY  1975  . TUBAL LIGATION    . VESICOVAGINAL FISTULA CLOSURE W/ TAH  1975   Family History  Problem Relation Age of Onset  . Kidney disease Mother   . Hypertension Mother   . Heart disease Father   . Diabetes Father   . Hypertension Sister   . Atrial fibrillation Daughter   . CVA Daughter   . Drug abuse Son   . Osteoporosis Daughter   . Hypertension Daughter   . Colon cancer Neg Hx   . Colon polyps Neg Hx   . Rectal cancer Neg Hx   . Stomach cancer Neg Hx   . Esophageal cancer Neg Hx    Social History   Socioeconomic History  . Marital status: Widowed    Spouse name: Not on file  . Number of children: Not on file  . Years of education: Not on file  . Highest education level: Not on file  Occupational History  . Not on file  Tobacco Use  . Smoking status: Current Every Day Smoker    Packs/day: 0.25    Years: 60.00    Pack years: 15.00    Types: Cigarettes  . Smokeless tobacco: Never Used  . Tobacco comment: 5 a day   Substance and Sexual Activity  . Alcohol use: No  . Drug use: No  . Sexual activity: Not on file  Other Topics Concern  . Not on file  Social History Narrative   Widowed 2013   Smokes about 1/2 pack a week   Alcohol none   Exercise 5 days a week, treadmill, dance class, zumba class   Social Determinants of Health   Financial Resource Strain:   . Difficulty of Paying Living Expenses:   Food Insecurity:   . Worried About Charity fundraiser in the Last Year:   . Arboriculturist in the Last Year:   Transportation Needs:   . Film/video editor (Medical):   Marland Kitchen Lack of Transportation (Non-Medical):   Physical Activity:   . Days of Exercise per Week:   . Minutes of Exercise per Session:   Stress:   . Feeling of Stress :   Social Connections:   . Frequency of Communication with Friends and Family:   . Frequency of Social Gatherings with Friends and Family:   . Attends Religious Services:   . Active Member of  Clubs or Organizations:   . Attends Archivist Meetings:   Marland Kitchen Marital Status:     Outpatient Encounter Medications as of 05/10/2019  Medication Sig  . aspirin 81 MG tablet Take 81 mg by mouth daily.   . cholecalciferol (VITAMIN D) 1000 units tablet Take 1,000 Units by mouth daily.  . cloNIDine (CATAPRES) 0.1 MG tablet TAKE 1 TABLET BY MOUTH TWICE A DAY TO CONTROL BLOOD PRESSURE  . clorazepate (TRANXENE) 7.5 MG tablet TAKE 1 TABLET BY MOUTH TWICE A DAY  . fexofenadine (HM FEXOFENADINE HCL) 180 MG tablet Take 180 mg by mouth daily as needed.   . furosemide (LASIX) 40 MG tablet TAKE 1 TABLET BY MOUTH EVERY DAY  . GARLIC PO Take 1 tablet by mouth daily.   Marland Kitchen losartan (COZAAR) 100 MG tablet TAKE 1 TABLET BY MOUTH EVERY DAY  FOR BLOOD PRESSURE  . Psyllium-Calcium (METAMUCIL PLUS CALCIUM) CAPS Take 3 capsules by mouth 3 (three) times daily.  . simvastatin (ZOCOR) 40 MG tablet TAKE 1 TABLET BY MOUTH EVERY DAY TO LOWER CHOLESTEROL  . [DISCONTINUED] cyclobenzaprine (FLEXERIL) 5 MG tablet Take 1 tablet (5 mg total) by mouth at bedtime as needed for muscle spasms.  . [DISCONTINUED] hyoscyamine (LEVBID) 0.375 MG 12 hr tablet Take 0.375 mg 2 (two) times daily by mouth.  . [DISCONTINUED] KRILL OIL PO Take by mouth daily.   No facility-administered encounter medications on file as of 05/10/2019.    Activities of Daily Living In your present state of health, do you have any difficulty performing the following activities: 05/10/2019  Hearing? N  Vision? N  Difficulty concentrating or making decisions? N  Walking or climbing stairs? N  Dressing or bathing? N  Doing errands, shopping? N  Preparing Food and eating ? N  Using the Toilet? Y  Comment metamucil fiber supplement for constipation  In the past six months, have you accidently leaked urine? N  Do you have problems with loss of bowel control? N  Managing your Medications? N  Managing your Finances? N  Housekeeping or managing your  Housekeeping? N  Some recent data might be hidden    Patient Care Team: Gayland Curry, DO as PCP - General (Geriatric Medicine)    Assessment:   This is a routine wellness examination for Felicia Sawyer.  Exercise Activities and Dietary recommendations Current Exercise Habits: Home exercise routine, Type of exercise: walking, Time (Minutes): 30, Frequency (Times/Week): 5, Weekly Exercise (Minutes/Week): 150, Intensity: Moderate, Exercise limited by: None identified  Goals   None     Fall Risk Fall Risk  05/10/2019 11/30/2018 05/07/2018 11/06/2017 05/05/2017  Falls in the past year? 0 0 0 No No  Number falls in past yr: 0 - 0 - -  Injury with Fall? 0 - 0 - -   Is the patient's home free of loose throw rugs in walkways, pet beds, electrical cords, etc?   no      Grab bars in the bathroom? no      Handrails on the stairs?   yes      Adequate lighting?   yes  Depression Screen PHQ 2/9 Scores 05/10/2019 05/07/2018 11/06/2017 05/05/2017  PHQ - 2 Score 0 0 0 0     Cognitive Function MMSE - Mini Mental State Exam 02/06/2016 11/30/2013  Orientation to time 5 5  Orientation to Place 5 5  Registration 3 3  Attention/ Calculation 5 3  Recall 3 3  Language- name 2 objects 2 2  Language- repeat 1 1  Language- follow 3 step command 3 3  Language- read & follow direction 1 1  Write a sentence 1 1  Copy design 1 1  Total score 30 28     6CIT Screen 05/10/2019  What Year? 0 points  What month? 0 points  What time? 0 points  Count back from 20 0 points  Months in reverse 0 points  Repeat phrase 0 points  Total Score 0    Immunization History  Administered Date(s) Administered  . Fluad Quad(high Dose 65+) 11/25/2018  . Influenza, High Dose Seasonal PF 11/26/2016, 11/21/2017  . Influenza-Unspecified 11/29/2009, 12/02/2012, 11/27/2013, 11/29/2014, 11/26/2015  . PFIZER SARS-COV-2 Vaccination 05/01/2019  . Pneumococcal Conjugate-13 04/16/2002, 11/04/2016  . Pneumococcal Polysaccharide-23  05/07/2018  . Td 01/16/1993  . Tdap 03/06/2011  . Zoster 11/25/2013    Qualifies for Shingles  Vaccine? Needs Shingrix   Screening Tests Health Maintenance  Topic Date Due  . TETANUS/TDAP  03/05/2021  . INFLUENZA VACCINE  Completed  . DEXA SCAN  Completed  . PNA vac Low Risk Adult  Completed    Cancer Screenings: Lung: Low Dose CT Chest recommended if Age 37-80 years, 30 pack-year currently smoking OR have quit w/in 15years. Patient does qualify.Declined  Breast:  Up to date on Mammogram? Yes   Up to date of Bone Density/Dexa? Yes Colorectal: Up to date   Additional Screenings:  Hepatitis C Screening: Low Risk      Plan:   - Advised to get Shingrix Vaccine   I have personally reviewed and noted the following in the patient's chart:   . Medical and social history . Use of alcohol, tobacco or illicit drugs  . Current medications and supplements . Functional ability and status . Nutritional status . Physical activity . Advanced directives . List of other physicians . Hospitalizations, surgeries, and ER visits in previous 12 months . Vitals . Screenings to include cognitive, depression, and falls . Referrals and appointments  In addition, I have reviewed and discussed with patient certain preventive protocols, quality metrics, and best practice recommendations. A written personalized care plan for preventive services as well as general preventive health recommendations were provided to patient.    Sandrea Hughs, NP  05/10/2019

## 2019-05-10 NOTE — Progress Notes (Signed)
Patient ID: Felicia Sawyer, female   DOB: September 10, 1940, 79 y.o.   MRN: [id number] This service is provided via telemedicine  No vital signs collected/recorded due to the encounter was a telemedicine visit.   Location of patient (ex: home, work):  HOME  Patient consents to a telephone visit:  YES  Location of the provider (ex: office, home):  OFFICE  Name of any referring provider:  TIFFANY REED, DO  Names of all persons participating in the telemedicine service and their role in the encounter:  PATIENT, Felicia Sawyer, [place], [place], NP  Time spent on call:  9:50

## 2019-05-20 ENCOUNTER — Other Ambulatory Visit: Payer: Medicare HMO

## 2019-05-20 ENCOUNTER — Ambulatory Visit: Payer: Medicare HMO

## 2019-05-20 ENCOUNTER — Other Ambulatory Visit: Payer: Self-pay | Admitting: Internal Medicine

## 2019-05-20 DIAGNOSIS — F418 Other specified anxiety disorders: Secondary | ICD-10-CM

## 2019-05-20 DIAGNOSIS — I10 Essential (primary) hypertension: Secondary | ICD-10-CM

## 2019-05-20 MED ORDER — CLORAZEPATE DIPOTASSIUM 7.5 MG PO TABS: 7.5000 mg | ORAL_TABLET | Freq: Two times a day (BID) | ORAL | 5 refills | Status: DC

## 2019-05-20 NOTE — Addendum Note (Signed)
Addended by: Logan Bores on: 05/20/2019 02:59 PM   Modules accepted: Orders

## 2019-05-31 ENCOUNTER — Other Ambulatory Visit: Payer: Self-pay

## 2019-05-31 ENCOUNTER — Ambulatory Visit (INDEPENDENT_AMBULATORY_CARE_PROVIDER_SITE_OTHER): Payer: Medicare HMO | Admitting: Internal Medicine

## 2019-05-31 ENCOUNTER — Encounter: Payer: Self-pay | Admitting: Internal Medicine

## 2019-05-31 VITALS — BP 122/74 | HR 65 | Temp 97.8°F | Ht 61.0 in | Wt 158.6 lb

## 2019-05-31 DIAGNOSIS — I739 Peripheral vascular disease, unspecified: Secondary | ICD-10-CM | POA: Diagnosis not present

## 2019-05-31 DIAGNOSIS — E785 Hyperlipidemia, unspecified: Secondary | ICD-10-CM

## 2019-05-31 DIAGNOSIS — M545 Low back pain, unspecified: Secondary | ICD-10-CM

## 2019-05-31 DIAGNOSIS — I10 Essential (primary) hypertension: Secondary | ICD-10-CM

## 2019-05-31 DIAGNOSIS — Z716 Tobacco abuse counseling: Secondary | ICD-10-CM

## 2019-05-31 DIAGNOSIS — Z6829 Body mass index (BMI) 29.0-29.9, adult: Secondary | ICD-10-CM

## 2019-05-31 DIAGNOSIS — F411 Generalized anxiety disorder: Secondary | ICD-10-CM

## 2019-05-31 DIAGNOSIS — E663 Overweight: Secondary | ICD-10-CM

## 2019-05-31 DIAGNOSIS — Z0000 Encounter for general adult medical examination without abnormal findings: Secondary | ICD-10-CM | POA: Diagnosis not present

## 2019-05-31 DIAGNOSIS — F3342 Major depressive disorder, recurrent, in full remission: Secondary | ICD-10-CM

## 2019-05-31 DIAGNOSIS — F17200 Nicotine dependence, unspecified, uncomplicated: Secondary | ICD-10-CM

## 2019-05-31 DIAGNOSIS — G8929 Other chronic pain: Secondary | ICD-10-CM

## 2019-05-31 NOTE — Patient Instructions (Signed)
Start back on your vitamin D 1000 units daily.

## 2019-05-31 NOTE — Progress Notes (Signed)
Provider:  Rexene Edison. Mariea Clonts, D.O., C.M.D. Location:   [place]   Place of Service:   clinic  Previous PCP: Gayland Curry, DO Patient Care Team: Gayland Curry, DO as PCP - General (Geriatric Medicine)  Extended Emergency Contact Information Primary Emergency Contact: Wade,Janice Address: [place], [place] Phone: [phone] Relation: Daughter Secondary Emergency Contact: [place]' Mobile Phone: [phone] Relation: Daughter  Goals of Care: Advanced Directive information Advanced Directives 05/31/2019  Does Patient Have a Medical Advance Directive? No  Does patient want to make changes to medical advance directive? No - Patient declined  Would patient like information on creating a medical advance directive? -      Chief Complaint  Patient presents with  . Annual Exam     Annual physical exam / 6 month follow up     HPI: Patient is a 79 y.o. female seen today for an annual physical exam.    Her vision is declining and she was squinting to read.  She has an eye appt this month.  She does have cataracts for 20 years and it is probably time.  She feels like glasses weren't made right to start with--feels like her bifocal was too far up.  Goes to [place]. She does not drive at night.    Mood is fine.  Gets a little lonesome.  Sees family, but not others these days.  She's had both her vaccines 3/6 and 3/27.  Had her doubts and fears and her nephew that's a nurse and minister convinced her to take it after she watched a program 4 times.  She had a very sore arm, but that was it.    Her children are getting theirs except her grandson.    Back pain:  Ok.  Sometimes right hip will ache a little at night.  Sleeps on left side and thinks she might sleep crooked.  Knees crackle when she gets up.    She used to take calcium and stopped.  Encouraged her to resume her vitamin D3 1000 units daily.    Smoking:  Still  smokes--about 6 cigarettes per day--makes a pack last three days.    She is still walking.  Walked with her daughter downtown this morning outside.  Walked 40 mins.    Past Medical History:  Diagnosis Date  . Abnormality of gait 07/11/2005  . Allergy   . Anxiety state, unspecified 04/08/2012  . Asymptomatic varicose veins 09/24/2011  . Cataract    bilateral  . Chronic venous insufficiency 09/16/2012  . Degenerative and vascular disorders of ear, unspecified 08/25/2002  . Depressive disorder, not elsewhere classified 11/10/2002  . Diverticulosis of colon (without mention of hemorrhage) 08/25/1989  . Edema 09/24/2011  . Esophageal reflux 11/10/2002   pt denies reflux  . First degree atrioventricular block 07/19/2008  . Heart murmur   . Hypertension   . Inguinal hernia without mention of obstruction or gangrene, unilateral or unspecified, (not specified as recurrent) 11/28/2006  . Insomnia, unspecified 12/15/2007  . Leiomyoma of uterus, unspecified 05/27/2006  . Leukocytopenia, unspecified 08/28/2010  . Lumbago 07/22/2007  . Neuromuscular disorder ([place])    cervical disc pain/radiating  . Obesity, unspecified 05/26/1996  . Osteoarthrosis, unspecified whether generalized or localized, unspecified site 05/26/2004  . Other abnormal blood chemistry 07/19/2008  . Other and unspecified hyperlipidemia 10/24/2004  . Palpitations 08/29/2007  . Peptic ulcer, unspecified site, unspecified as acute or chronic,  without mention of hemorrhage, perforation, or obstruction 05/27/2006  . Scoliosis (and kyphoscoliosis), idiopathic 05/23/2009  . Symptomatic menopausal or female climacteric states 05/28/1990  . Unspecified essential hypertension 11/13/2004   Past Surgical History:  Procedure Laterality Date  . CHOLECYSTECTOMY  1971  . ENDOMETRIAL BIOPSY  03/1992   Dr. Mallie Mussel  . FOOT SURGERY Left 04/2003   [facility]- Dr. Caffie Pinto  . INGUINAL HERNIA REPAIR Right 03/2007   Dr. Georgette Dover  . KNEE  ARTHROSCOPY Left 07/1997   Dr. Wonda Olds  . KNEE SURGERY Right 03/02/2004   Dr. Berenice Primas  . OOPHORECTOMY Right 1974   Dr. Amalia Hailey  . THUMB ARTHROSCOPY Right 1995   Dr.Sypher  . TONSILLECTOMY AND ADENOIDECTOMY  1975  . TUBAL LIGATION    . VESICOVAGINAL FISTULA CLOSURE W/ TAH  1975    reports that she has been smoking cigarettes. She has a 15.00 pack-year smoking history. She has never used smokeless tobacco. She reports that she does not drink alcohol or use drugs.  Functional Status Survey:    Family History  Problem Relation Age of Onset  . Kidney disease Mother   . Hypertension Mother   . Heart disease Father   . Diabetes Father   . Hypertension Sister   . Atrial fibrillation Daughter   . CVA Daughter   . Drug abuse Son   . Osteoporosis Daughter   . Hypertension Daughter   . Colon cancer Neg Hx   . Colon polyps Neg Hx   . Rectal cancer Neg Hx   . Stomach cancer Neg Hx   . Esophageal cancer Neg Hx     Health Maintenance  Topic Date Due  . INFLUENZA VACCINE  09/26/2019  . TETANUS/TDAP  03/05/2021  . DEXA SCAN  Completed  . PNA vac Low Risk Adult  Completed    Allergies  Allergen Reactions  . Caffeine Other (See Comments)    Rapid heart rate   . Demerol [Meperidine]   . Venlafaxine Hcl Other (See Comments)    Hyper  . Pseudoephedrine Palpitations    Outpatient Encounter Medications as of 05/31/2019  Medication Sig  . aspirin 81 MG tablet Take 81 mg by mouth daily.   . cholecalciferol (VITAMIN D) 1000 units tablet Take 1,000 Units by mouth daily.  . cloNIDine (CATAPRES) 0.1 MG tablet TAKE 1 TABLET BY MOUTH TWICE A DAY TO CONTROL BLOOD PRESSURE  . clorazepate (TRANXENE) 7.5 MG tablet Take 1 tablet (7.5 mg total) by mouth 2 (two) times daily.  . fexofenadine (HM FEXOFENADINE HCL) 180 MG tablet Take 180 mg by mouth daily as needed.   . furosemide (LASIX) 40 MG tablet TAKE 1 TABLET BY MOUTH EVERY DAY  . GARLIC PO Take 1 tablet by mouth daily.   Marland Kitchen losartan (COZAAR) 100  MG tablet TAKE 1 TABLET BY MOUTH EVERY DAY FOR BLOOD PRESSURE  . Psyllium-Calcium (METAMUCIL PLUS CALCIUM) CAPS Take 3 capsules by mouth 3 (three) times daily.  . simvastatin (ZOCOR) 40 MG tablet TAKE 1 TABLET BY MOUTH EVERY DAY TO LOWER CHOLESTEROL   No facility-administered encounter medications on file as of 05/31/2019.    Review of Systems  Constitutional: Negative for chills, fever and malaise/fatigue.  Eyes: Positive for blurred vision.  Respiratory: Negative for cough, sputum production, shortness of breath and wheezing.   Cardiovascular: Negative for chest pain, palpitations and leg swelling.  Gastrointestinal: Negative for abdominal pain, blood in stool, constipation, diarrhea and melena.       Ventral hernia  Genitourinary: Negative  for dysuria.  Musculoskeletal: Positive for back pain and joint pain. Negative for falls.  Skin: Negative for itching and rash.  Neurological: Negative for dizziness and loss of consciousness.  Endo/Heme/Allergies: Does not bruise/bleed easily.  Psychiatric/Behavioral: Negative for depression and memory loss. The patient is nervous/anxious and has insomnia.     Vitals:   05/31/19 1325  BP: 122/74  Pulse: 65  Temp: 97.8 F (36.6 C)  TempSrc: Temporal  SpO2: 95%  Weight: 158 lb 9.6 oz (71.9 kg)  Height: 5\' 1"  (1.549 m)   Body mass index is 29.97 kg/m. Physical Exam Vitals reviewed.  Constitutional:      General: She is not in acute distress.    Appearance: Normal appearance. She is not ill-appearing or toxic-appearing.  HENT:     Head: Normocephalic and atraumatic.     Right Ear: External ear normal.     Left Ear: External ear normal.     Nose: Nose normal.     Mouth/Throat:     Pharynx: Oropharynx is clear.  Eyes:     Extraocular Movements: Extraocular movements intact.     Conjunctiva/sclera: Conjunctivae normal.     Pupils: Pupils are equal, round, and reactive to light.     Comments: Cloudy left lens  Cardiovascular:     Rate  and Rhythm: Normal rate and regular rhythm.     Pulses: Normal pulses.     Heart sounds: Normal heart sounds. No murmur.  Pulmonary:     Effort: Pulmonary effort is normal.     Breath sounds: No wheezing, rhonchi or rales.     Comments: Diminished breath sounds during expiratory phase Abdominal:     General: Bowel sounds are normal. There is no distension.     Palpations: Abdomen is soft.     Tenderness: There is no abdominal tenderness. There is no guarding or rebound.     Hernia: A hernia is present.  Musculoskeletal:        General: Normal range of motion.     Comments: Bilateral compression hose for nonpitting edema  Skin:    General: Skin is warm and dry.  Neurological:     General: No focal deficit present.     Mental Status: She is alert and oriented to person, place, and time.     Cranial Nerves: No cranial nerve deficit.     Motor: No weakness.     Gait: Gait normal.  Psychiatric:        Mood and Affect: Mood normal.        Behavior: Behavior normal.     Labs reviewed: CBC: Recent Labs    11/30/18 1431  WBC 3.7*  NEUTROABS 1,635  HGB 11.8  HCT 36.5  MCV 93.1  PLT 174    Lab Results  Component Value Date   HGBA1C 5.3 11/30/2018   Assessment/Plan 1. Annual physical exam -performed today -for mammo and bone density in a few weeks - Hemoglobin A1c; Future  2. Recurrent major depressive disorder, in full remission [facility]) -doing much better now that she can at least see family as they get covid vaccinated  3. Tobacco use disorder -ongoing but less--down to 6 cigarettes per day (1pack per 3 days) -encouraged continued reduction  4. PVD (peripheral vascular disease) ([place]) -ongoing, no related s/s at this time -cont statin, baby asa, bp control  5. Overweight with body mass index (BMI) 25.0-29.9 -discussed with her that just a few lb weight loss would be great for her -she  is walking regularly   6. Body mass index 29.0-29.9, adult -educated on goal  bmi being a little higher in older adults in case of illness so reserves are available -cont regular exercise and healthy balanced diet  7. Smoking addiction -ongoing as above, not ready to quit   8. Encounter for smoking cessation counseling -provided more than 3 mins of counseling on risk reduction   9. Anxiety state Uses tranxene bid for her anxiety and unable to come off when suggested  10. Chronic low back pain without sciatica, unspecified back pain laterality -continue regular exercise program, not on regular medication for pain, uses occasional tylenol  11. Hyperlipidemia, unspecified hyperlipidemia type - cont zocor 40mg  nightly - Lipid panel; Future - Hemoglobin A1c; Future  12. Essential hypertension -cont losartan, lasix - CBC with Differential/Platelet; Future - COMPLETE METABOLIC PANEL WITH GFR; Future - Hemoglobin A1c; Future  Labs/tests ordered:  Cbc, cmp, flp, hba1c For mammogram and bone density >6 wks out from covid vaccine #2  F/u in 6 mos with fasting labs before  Londen Bok L. Jalecia Leon, D.O. [facility] [address], [place] [address] Cell Phone (Mon-Fri 8am-5pm):  [phone] On Call:  [phone] & follow prompts after 5pm & weekends Office Phone:  [phone] Office Fax:  [phone]

## 2019-07-22 ENCOUNTER — Other Ambulatory Visit: Payer: Self-pay

## 2019-07-22 ENCOUNTER — Ambulatory Visit: Payer: Medicare HMO

## 2019-07-22 ENCOUNTER — Ambulatory Visit
Admission: RE | Admit: 2019-07-22 | Discharge: 2019-07-22 | Disposition: A | Discharge status: Home / Self Care | Payer: Medicare HMO | Source: Ambulatory Visit | Attending: Internal Medicine | Admitting: Internal Medicine

## 2019-07-22 ENCOUNTER — Other Ambulatory Visit: Payer: Medicare HMO

## 2019-07-22 DIAGNOSIS — E2839 Other primary ovarian failure: Secondary | ICD-10-CM

## 2019-07-22 DIAGNOSIS — Z1231 Encounter for screening mammogram for malignant neoplasm of breast: Secondary | ICD-10-CM

## 2019-07-22 NOTE — Progress Notes (Signed)
Bone density has decreased since last bone density, but remains in osteopenic range so no change to regimen is needed.

## 2019-07-23 NOTE — Progress Notes (Signed)
Normal mammogram

## 2019-10-17 ENCOUNTER — Other Ambulatory Visit: Payer: Self-pay | Admitting: Internal Medicine

## 2019-11-11 ENCOUNTER — Other Ambulatory Visit: Payer: Self-pay | Admitting: Internal Medicine

## 2019-11-11 DIAGNOSIS — F418 Other specified anxiety disorders: Secondary | ICD-10-CM

## 2019-11-15 ENCOUNTER — Other Ambulatory Visit: Payer: Self-pay | Admitting: Internal Medicine

## 2019-11-15 DIAGNOSIS — I10 Essential (primary) hypertension: Secondary | ICD-10-CM

## 2019-11-22 ENCOUNTER — Other Ambulatory Visit: Payer: Self-pay | Admitting: Internal Medicine

## 2019-11-22 DIAGNOSIS — I10 Essential (primary) hypertension: Secondary | ICD-10-CM

## 2019-11-22 NOTE — Telephone Encounter (Signed)
rx sent to pharmacy by e-script  

## 2019-11-29 ENCOUNTER — Other Ambulatory Visit: Payer: Medicare HMO

## 2019-12-02 ENCOUNTER — Ambulatory Visit (INDEPENDENT_AMBULATORY_CARE_PROVIDER_SITE_OTHER): Payer: Medicare HMO | Admitting: Internal Medicine

## 2019-12-02 ENCOUNTER — Other Ambulatory Visit: Payer: Self-pay

## 2019-12-02 ENCOUNTER — Encounter: Payer: Self-pay | Admitting: Internal Medicine

## 2019-12-02 VITALS — BP 138/86 | HR 74 | Temp 96.6°F | Ht 61.0 in | Wt 152.0 lb

## 2019-12-02 DIAGNOSIS — H25013 Cortical age-related cataract, bilateral: Secondary | ICD-10-CM | POA: Insufficient documentation

## 2019-12-02 DIAGNOSIS — I10 Essential (primary) hypertension: Secondary | ICD-10-CM

## 2019-12-02 DIAGNOSIS — Z1159 Encounter for screening for other viral diseases: Secondary | ICD-10-CM

## 2019-12-02 DIAGNOSIS — F3341 Major depressive disorder, recurrent, in partial remission: Secondary | ICD-10-CM | POA: Diagnosis not present

## 2019-12-02 DIAGNOSIS — F17200 Nicotine dependence, unspecified, uncomplicated: Secondary | ICD-10-CM

## 2019-12-02 DIAGNOSIS — M8589 Other specified disorders of bone density and structure, multiple sites: Secondary | ICD-10-CM | POA: Insufficient documentation

## 2019-12-02 DIAGNOSIS — E785 Hyperlipidemia, unspecified: Secondary | ICD-10-CM | POA: Diagnosis not present

## 2019-12-02 DIAGNOSIS — F411 Generalized anxiety disorder: Secondary | ICD-10-CM

## 2019-12-02 NOTE — Progress Notes (Signed)
Location:  [facility] clinic Provider:  Makai Dumond L. Mariea Clonts, D.O., C.M.D.   Goals of Care:  Advanced Directives 12/02/2019  Does Patient Have a Medical Advance Directive? No  Does patient want to make changes to medical advance directive? -  Would patient like information on creating a medical advance directive? No - Patient declined   Chief Complaint  Patient presents with  . Medical Management of Chronic Issues    6 month follow-up. Discuss need for Hep C screening     HPI: Patient is a 79 y.o. female seen today for medical management of chronic diseases.    She is good outside of getting down at times with the trouble of the world. She stopped watching a lot of the bad news.  Shooting in schools, young people killing others makes her sad.  She is sleeping well.    She has a crawl space and a moisture barrier with rocks and plastic.  She heard something scratching under there.  Nothing was caught but pest mgt thought it was a possum or squirrel and insulation had to be redone.  They'd pushed out a vent that was under the deck so it is now caulked.  That had her worried at night.    Just got her flu shot Tuesday.  She wanted to wait b/w her flu and covid shot.    She has no medical concerns.    BP was ok today.    She smoked 5-6 cigarettes per day and makes a pack last 3 days.    She is still walking for exercise.  She did 35 mins this am.  [place] Mylar, [place] [employer].  Usually that's an hour.    Has cataracts and distance vision is getting bad--sees Dr. Katy Fitch next month.  Having trouble with signs far away.    Past Medical History:  Diagnosis Date  . Abnormality of gait 07/11/2005  . Allergy   . Anxiety state, unspecified 04/08/2012  . Asymptomatic varicose veins 09/24/2011  . Cataract    bilateral  . Chronic venous insufficiency 09/16/2012  . DDD (degenerative disc disease), cervical   . Degenerative and vascular disorders of ear, unspecified  08/25/2002  . Depressive disorder, not elsewhere classified 11/10/2002  . Diverticulosis of colon (without mention of hemorrhage) 08/25/1989  . Edema 09/24/2011  . Esophageal reflux 11/10/2002   pt denies reflux  . First degree atrioventricular block 07/19/2008  . Heart murmur   . Hypertension   . Inguinal hernia without mention of obstruction or gangrene, unilateral or unspecified, (not specified as recurrent) 11/28/2006  . Insomnia, unspecified 12/15/2007  . Leiomyoma of uterus, unspecified 05/27/2006  . Leukocytopenia, unspecified 08/28/2010  . Lumbago 07/22/2007  . Obesity, unspecified 05/26/1996  . Osteoarthrosis, unspecified whether generalized or localized, unspecified site 05/26/2004  . Other abnormal blood chemistry 07/19/2008  . Other and unspecified hyperlipidemia 10/24/2004  . Palpitations 08/29/2007  . Peptic ulcer, unspecified site, unspecified as acute or chronic, without mention of hemorrhage, perforation, or obstruction 05/27/2006  . Scoliosis (and kyphoscoliosis), idiopathic 05/23/2009  . Symptomatic menopausal or female climacteric states 05/28/1990  . Unspecified essential hypertension 11/13/2004    Past Surgical History:  Procedure Laterality Date  . CHOLECYSTECTOMY  1971  . ENDOMETRIAL BIOPSY  03/1992   Dr. Mallie Mussel  . FOOT SURGERY Left 04/2003   [facility]- Dr. Caffie Pinto  . INGUINAL HERNIA REPAIR Right 03/2007   Dr. Georgette Dover  . KNEE ARTHROSCOPY Left 07/1997   Dr. Wonda Olds  .  KNEE SURGERY Right 03/02/2004   Dr. Berenice Primas  . OOPHORECTOMY Right 1974   Dr. Amalia Hailey  . THUMB ARTHROSCOPY Right 1995   Dr.Sypher  . TONSILLECTOMY AND ADENOIDECTOMY  1975  . TUBAL LIGATION    . VESICOVAGINAL FISTULA CLOSURE W/ TAH  1975    Allergies  Allergen Reactions  . Caffeine Other (See Comments)    Rapid heart rate   . Demerol [Meperidine]   . Venlafaxine Hcl Other (See Comments)    Hyper  . Pseudoephedrine Palpitations    Outpatient Encounter Medications as of 12/02/2019   Medication Sig  . aspirin 81 MG tablet Take 81 mg by mouth daily.   . cholecalciferol (VITAMIN D) 1000 units tablet Take 1,000 Units by mouth daily.  . cloNIDine (CATAPRES) 0.1 MG tablet TAKE 1 TABLET BY MOUTH TWICE A DAY TO CONTROL BLOOD PRESSURE  . clorazepate (TRANXENE) 7.5 MG tablet TAKE 1 TABLET (7.5 MG TOTAL) BY MOUTH 2 (TWO) TIMES DAILY.  . fexofenadine (HM FEXOFENADINE HCL) 180 MG tablet Take 180 mg by mouth daily as needed.   . furosemide (LASIX) 40 MG tablet TAKE 1 TABLET BY MOUTH EVERY DAY  . GARLIC PO Take 1 tablet by mouth daily.   Marland Kitchen losartan (COZAAR) 100 MG tablet TAKE 1 TABLET BY MOUTH EVERY DAY FOR BLOOD PRESSURE  . Psyllium-Calcium (METAMUCIL PLUS CALCIUM) CAPS Take 3 capsules by mouth 3 (three) times daily.  . simvastatin (ZOCOR) 40 MG tablet TAKE 1 TABLET BY MOUTH EVERY DAY TO LOWER CHOLESTEROL   No facility-administered encounter medications on file as of 12/02/2019.    Review of Systems:  Review of Systems  Constitutional: Negative for chills, fever and malaise/fatigue.  HENT: Negative for congestion and sore throat.   Eyes: Positive for blurred vision.  Respiratory: Negative for cough and shortness of breath.   Cardiovascular: Positive for leg swelling. Negative for chest pain and palpitations.  Gastrointestinal: Negative for abdominal pain, blood in stool, constipation, diarrhea and melena.  Genitourinary: Negative for dysuria.  Musculoskeletal: Negative for back pain, falls and joint pain.  Skin: Negative for itching and rash.  Neurological: Negative for dizziness and loss of consciousness.  Endo/Heme/Allergies: Bruises/bleeds easily.  Psychiatric/Behavioral: Positive for depression. Negative for memory loss. The patient is nervous/anxious. The patient does not have insomnia.     Health Maintenance  Topic Date Due  . Hepatitis C Screening  Never done  . TETANUS/TDAP  03/05/2021  . INFLUENZA VACCINE  Completed  . DEXA SCAN  Completed  . COVID-19 Vaccine   Completed  . PNA vac Low Risk Adult  Completed    Physical Exam: Vitals:   12/02/19 1300  BP: 138/86  Pulse: 74  Temp: (!) 96.6 F (35.9 C)  TempSrc: Temporal  SpO2: 94%  Weight: 152 lb (68.9 kg)  Height: 5\' 1"  (1.549 m)   Body mass index is 28.72 kg/m. Physical Exam Vitals reviewed.  Constitutional:      General: She is not in acute distress.    Appearance: Normal appearance. She is not toxic-appearing.  HENT:     Head: Normocephalic and atraumatic.  Cardiovascular:     Rate and Rhythm: Normal rate and regular rhythm.     Pulses: Normal pulses.     Heart sounds: Normal heart sounds.  Pulmonary:     Effort: Pulmonary effort is normal.     Breath sounds: Normal breath sounds. No wheezing, rhonchi or rales.  Abdominal:     General: Bowel sounds are normal.  Tenderness: There is no abdominal tenderness. There is no guarding or rebound.  Musculoskeletal:     Cervical back: Neck supple.     Right lower leg: Edema present.     Left lower leg: Edema (controlled with compression hose) present.  Skin:    General: Skin is warm and dry.  Neurological:     General: No focal deficit present.     Mental Status: She is alert and oriented to person, place, and time.     Cranial Nerves: No cranial nerve deficit.     Motor: No weakness.     Gait: Gait normal.  Psychiatric:        Mood and Affect: Mood normal.        Behavior: Behavior normal.     Labs reviewed: Basic Metabolic Panel: No results for input(s): NA, K, CL, CO2, GLUCOSE, BUN, CREATININE, CALCIUM, MG, PHOS, TSH in the last 8760 hours. Liver Function Tests: No results for input(s): AST, ALT, ALKPHOS, BILITOT, PROT, ALBUMIN in the last 8760 hours. No results for input(s): LIPASE, AMYLASE in the last 8760 hours. No results for input(s): AMMONIA in the last 8760 hours. CBC: No results for input(s): WBC, NEUTROABS, HGB, HCT, MCV, PLT in the last 8760 hours. Lipid Panel: No results for input(s): CHOL, HDL,  LDLCALC, TRIG, CHOLHDL, LDLDIRECT in the last 8760 hours. Lab Results  Component Value Date   HGBA1C 5.3 11/30/2018    Assessment/Plan 1. Encounter for hepatitis C screening test for low risk patient -Hep C antibody  2. Essential hypertension -bp at goal, cont same regimen, encouraged her exercise program and smoking cessation  3. Hyperlipidemia, unspecified hyperlipidemia type -cont simvastatin therapy, has labs today  4. Recurrent major depressive disorder, in partial remission ([place]) -cont her chronic anxiolytics, but opts not to take ssri/snri  5. Tobacco use disorder -ongoing, not ready to quit--feels smoking helps her calm down  6. Anxiety state -cont anxiolytics, not willing to wean these and recently more anxious amid covid  7. Osteopenia of multiple sites -cont vitamin D3, walking and add light weights to routine   8. Cortical age-related cataract of both eyes -for surgery  Labs/tests ordered:  Add hep c screen and get all other future labs I'd put in that were intended to be before her appt  Next appt:  05/11/2020   Reakwon Barren L. Jamauri Kruzel, D.O. [facility] [address], [place] [address] Cell Phone (Mon-Fri 8am-5pm):  [phone] On Call:  [phone] & follow prompts after 5pm & weekends Office Phone:  [phone] Office Fax:  [phone]

## 2019-12-03 LAB — CBC WITH DIFFERENTIAL/PLATELET
Absolute Monocytes: 655 cells/uL (ref 200–950)
Basophils Absolute: 72 cells/uL (ref 0–200)
Basophils Relative: 1 %
Eosinophils Absolute: 101 cells/uL (ref 15–500)
Eosinophils Relative: 1.4 %
HCT: 44.9 % (ref 35.0–45.0)
Hemoglobin: 15.1 g/dL (ref 11.7–15.5)
Lymphs Abs: 1814 cells/uL (ref 850–3900)
MCH: 29.5 pg (ref 27.0–33.0)
MCHC: 33.6 g/dL (ref 32.0–36.0)
MCV: 87.7 fL (ref 80.0–100.0)
MPV: 11.1 fL (ref 7.5–12.5)
Monocytes Relative: 9.1 %
Neutro Abs: 4558 cells/uL (ref 1500–7800)
Neutrophils Relative %: 63.3 %
Platelets: 237 10*3/uL (ref 140–400)
RBC: 5.12 10*6/uL — ABNORMAL HIGH (ref 3.80–5.10)
RDW: 13.1 % (ref 11.0–15.0)
Total Lymphocyte: 25.2 %
WBC: 7.2 10*3/uL (ref 3.8–10.8)

## 2019-12-03 LAB — COMPLETE METABOLIC PANEL WITH GFR
AG Ratio: 1.5 (calc) (ref 1.0–2.5)
ALT: 13 U/L (ref 6–29)
AST: 17 U/L (ref 10–35)
Albumin: 3.8 g/dL (ref 3.6–5.1)
Alkaline phosphatase (APISO): 67 U/L (ref 37–153)
BUN: 10 mg/dL (ref 7–25)
CO2: 27 mmol/L (ref 20–32)
Calcium: 9.4 mg/dL (ref 8.6–10.4)
Chloride: 98 mmol/L (ref 98–110)
Creat: 0.8 mg/dL (ref 0.60–0.93)
GFR, Est African American: 82 mL/min/{1.73_m2} (ref >=60)
GFR, Est Non African American: 71 mL/min/{1.73_m2} (ref >=60)
Globulin: 2.6 g/dL (calc) (ref 1.9–3.7)
Glucose, Bld: 81 mg/dL (ref 65–99)
Potassium: 4.1 mmol/L (ref 3.5–5.3)
Sodium: 136 mmol/L (ref 135–146)
Total Bilirubin: 0.5 mg/dL (ref 0.2–1.2)
Total Protein: 6.4 g/dL (ref 6.1–8.1)

## 2019-12-03 LAB — LIPID PANEL
Cholesterol: 145 mg/dL (ref <200)
HDL: 54 mg/dL (ref >=50)
LDL Cholesterol (Calc): 77 mg/dL (calc)
Non-HDL Cholesterol (Calc): 91 mg/dL (calc) (ref <130)
Total CHOL/HDL Ratio: 2.7 (calc) (ref <5.0)
Triglycerides: 55 mg/dL (ref <150)

## 2019-12-03 LAB — HEMOGLOBIN A1C
Hgb A1c MFr Bld: 5.3 % of total Hgb (ref <5.7)
Mean Plasma Glucose: 105 (calc)
eAG (mmol/L): 5.8 (calc)

## 2019-12-03 LAB — HEPATITIS C ANTIBODY
Hepatitis C Ab: NONREACTIVE
SIGNAL TO CUT-OFF: 0.03 (ref <1.00)

## 2019-12-03 NOTE — Progress Notes (Signed)
Labs are all ok.

## 2020-01-08 ENCOUNTER — Other Ambulatory Visit: Payer: Self-pay

## 2020-01-08 ENCOUNTER — Ambulatory Visit: Payer: Medicare HMO | Attending: Internal Medicine

## 2020-01-08 DIAGNOSIS — Z23 Encounter for immunization: Secondary | ICD-10-CM

## 2020-01-08 NOTE — Progress Notes (Signed)
   Covid-19 Vaccination Clinic  Name:  Felicia Sawyer    MRN: [id number] DOB: March 10, 1940  01/08/2020  Felicia Sawyer was observed post Covid-19 immunization for 15 minutes without incident. She was provided with Vaccine Information Sheet and instruction to access the V-Safe system.   Felicia Sawyer was instructed to call 911 with any severe reactions post vaccine: Marland Kitchen Difficulty breathing  . Swelling of face and throat  . A fast heartbeat  . A bad rash all over body  . Dizziness and weakness   Immunizations Administered    Name Date Dose VIS Date Route   Pfizer COVID-19 Vaccine 01/08/2020  4:00 PM 0.3 mL 12/15/2019 Intramuscular   Manufacturer: [place]   Lot: [id number]   [place]: [address]-8

## 2020-02-26 HISTORY — PX: CATARACT EXTRACTION: SUR2

## 2020-04-17 ENCOUNTER — Encounter: Payer: Self-pay | Admitting: Internal Medicine

## 2020-05-05 ENCOUNTER — Encounter: Payer: Self-pay | Admitting: Gastroenterology

## 2020-05-08 ENCOUNTER — Other Ambulatory Visit: Payer: Self-pay | Admitting: Internal Medicine

## 2020-05-08 DIAGNOSIS — I10 Essential (primary) hypertension: Secondary | ICD-10-CM

## 2020-05-11 ENCOUNTER — Encounter: Payer: Medicare HMO | Admitting: Family

## 2020-05-11 ENCOUNTER — Telehealth: Payer: Self-pay

## 2020-05-11 ENCOUNTER — Other Ambulatory Visit: Payer: Self-pay | Admitting: Internal Medicine

## 2020-05-11 ENCOUNTER — Other Ambulatory Visit: Payer: Self-pay

## 2020-05-11 DIAGNOSIS — F418 Other specified anxiety disorders: Secondary | ICD-10-CM

## 2020-05-11 NOTE — Telephone Encounter (Signed)
I called and spoke with patient about doing her Annual Wellness Visit she stated that her insurance copy had already done a virtual annual visit and she did not wish to do one again with [place]

## 2020-05-11 NOTE — Telephone Encounter (Signed)
Ms. byanka, landrus are scheduled for a virtual visit with your provider today.    Just as we do with appointments in the office, we must obtain your consent to participate.  Your consent will be active for this visit and any virtual visit you may have with one of our providers in the next 365 days.    If you have a MyChart account, I can also send a copy of this consent to you electronically.  All virtual visits are billed to your insurance company just like a traditional visit in the office.  As this is a virtual visit, video technology does not allow for your provider to perform a traditional examination.  This may limit your provider's ability to fully assess your condition.  If your provider identifies any concerns that need to be evaluated in person or the need to arrange testing such as labs, EKG, etc, we will make arrangements to do so.    Although advances in technology are sophisticated, we cannot ensure that it will always work on either your end or our end.  If the connection with a video visit is poor, we may have to switch to a telephone visit.  With either a video or telephone visit, we are not always able to ensure that we have a secure connection.   I need to obtain your verbal consent now.   Are you willing to proceed with your visit today?   Miyana Maddi Collar has provided verbal consent on 05/11/2020 for a virtual visit (video or telephone).   Oralia Manis, Progress 05/11/2020  8:47 AM

## 2020-05-11 NOTE — Progress Notes (Signed)
This service is provided via telemedicine  No vital signs collected/recorded due to the encounter was a telemedicine visit.   Location of patient (ex: home, work):  Home  Patient consents to a telephone visit:  Yes see telephone note 05/11/2020  Location of the provider (ex: office, home): [facility]  Name of any referring provider: N/A  Names of all persons participating in the telemedicine service and their role in the encounter: Marisa Cyphers RMA, Marlowe Sax  NP, Patient   Time spent on call:     This encounter was created in error - please disregard.

## 2020-06-01 ENCOUNTER — Ambulatory Visit: Payer: Medicare HMO | Admitting: Internal Medicine

## 2020-06-02 ENCOUNTER — Emergency Department (HOSPITAL_COMMUNITY): Payer: Medicare HMO

## 2020-06-02 ENCOUNTER — Other Ambulatory Visit: Payer: Self-pay

## 2020-06-02 ENCOUNTER — Inpatient Hospital Stay (HOSPITAL_COMMUNITY): Payer: Medicare HMO

## 2020-06-02 ENCOUNTER — Inpatient Hospital Stay (HOSPITAL_COMMUNITY)
Admission: EM | Admit: 2020-06-02 | Discharge: 2020-06-07 | DRG: 389 | Disposition: A | Discharge status: Home / Self Care | Payer: Medicare HMO | Attending: Internal Medicine | Admitting: Internal Medicine

## 2020-06-02 ENCOUNTER — Encounter (HOSPITAL_COMMUNITY): Payer: Self-pay | Admitting: Emergency Medicine

## 2020-06-02 DIAGNOSIS — Z823 Family history of stroke: Secondary | ICD-10-CM

## 2020-06-02 DIAGNOSIS — K420 Umbilical hernia with obstruction, without gangrene: Secondary | ICD-10-CM | POA: Diagnosis not present

## 2020-06-02 DIAGNOSIS — I739 Peripheral vascular disease, unspecified: Secondary | ICD-10-CM | POA: Diagnosis present

## 2020-06-02 DIAGNOSIS — I10 Essential (primary) hypertension: Secondary | ICD-10-CM | POA: Diagnosis present

## 2020-06-02 DIAGNOSIS — Z20822 Contact with and (suspected) exposure to covid-19: Secondary | ICD-10-CM | POA: Diagnosis present

## 2020-06-02 DIAGNOSIS — Z841 Family history of disorders of kidney and ureter: Secondary | ICD-10-CM | POA: Diagnosis not present

## 2020-06-02 DIAGNOSIS — I160 Hypertensive urgency: Secondary | ICD-10-CM | POA: Diagnosis present

## 2020-06-02 DIAGNOSIS — E871 Hypo-osmolality and hyponatremia: Secondary | ICD-10-CM | POA: Diagnosis not present

## 2020-06-02 DIAGNOSIS — N179 Acute kidney failure, unspecified: Secondary | ICD-10-CM | POA: Diagnosis present

## 2020-06-02 DIAGNOSIS — Z79899 Other long term (current) drug therapy: Secondary | ICD-10-CM

## 2020-06-02 DIAGNOSIS — Z9049 Acquired absence of other specified parts of digestive tract: Secondary | ICD-10-CM | POA: Diagnosis not present

## 2020-06-02 DIAGNOSIS — K439 Ventral hernia without obstruction or gangrene: Secondary | ICD-10-CM | POA: Diagnosis present

## 2020-06-02 DIAGNOSIS — F418 Other specified anxiety disorders: Secondary | ICD-10-CM | POA: Diagnosis present

## 2020-06-02 DIAGNOSIS — E785 Hyperlipidemia, unspecified: Secondary | ICD-10-CM | POA: Diagnosis present

## 2020-06-02 DIAGNOSIS — E861 Hypovolemia: Secondary | ICD-10-CM | POA: Diagnosis present

## 2020-06-02 DIAGNOSIS — F17210 Nicotine dependence, cigarettes, uncomplicated: Secondary | ICD-10-CM | POA: Diagnosis present

## 2020-06-02 DIAGNOSIS — Z0189 Encounter for other specified special examinations: Secondary | ICD-10-CM

## 2020-06-02 DIAGNOSIS — Z888 Allergy status to other drugs, medicaments and biological substances status: Secondary | ICD-10-CM

## 2020-06-02 DIAGNOSIS — J9811 Atelectasis: Secondary | ICD-10-CM | POA: Diagnosis present

## 2020-06-02 DIAGNOSIS — Z7982 Long term (current) use of aspirin: Secondary | ICD-10-CM

## 2020-06-02 DIAGNOSIS — Z885 Allergy status to narcotic agent status: Secondary | ICD-10-CM

## 2020-06-02 DIAGNOSIS — K565 Intestinal adhesions [bands], unspecified as to partial versus complete obstruction: Secondary | ICD-10-CM | POA: Diagnosis present

## 2020-06-02 DIAGNOSIS — J90 Pleural effusion, not elsewhere classified: Secondary | ICD-10-CM | POA: Diagnosis present

## 2020-06-02 DIAGNOSIS — Z8262 Family history of osteoporosis: Secondary | ICD-10-CM

## 2020-06-02 DIAGNOSIS — M5030 Other cervical disc degeneration, unspecified cervical region: Secondary | ICD-10-CM | POA: Diagnosis present

## 2020-06-02 DIAGNOSIS — E876 Hypokalemia: Secondary | ICD-10-CM | POA: Diagnosis present

## 2020-06-02 DIAGNOSIS — Z8249 Family history of ischemic heart disease and other diseases of the circulatory system: Secondary | ICD-10-CM

## 2020-06-02 DIAGNOSIS — K56609 Unspecified intestinal obstruction, unspecified as to partial versus complete obstruction: Secondary | ICD-10-CM

## 2020-06-02 DIAGNOSIS — Z833 Family history of diabetes mellitus: Secondary | ICD-10-CM

## 2020-06-02 DIAGNOSIS — E860 Dehydration: Secondary | ICD-10-CM | POA: Diagnosis present

## 2020-06-02 LAB — RESP PANEL BY RT-PCR (FLU A&B, COVID) ARPGX2
Influenza A by PCR: NEGATIVE
Influenza B by PCR: NEGATIVE
SARS Coronavirus 2 by RT PCR: NEGATIVE

## 2020-06-02 LAB — COMPREHENSIVE METABOLIC PANEL
ALT: 16 U/L (ref 0–44)
AST: 25 U/L (ref 15–41)
Albumin: 3.9 g/dL (ref 3.5–5.0)
Alkaline Phosphatase: 63 U/L (ref 38–126)
Anion gap: 13 (ref 5–15)
BUN: 19 mg/dL (ref 8–23)
CO2: 35 mmol/L — ABNORMAL HIGH (ref 22–32)
Calcium: 10.4 mg/dL — ABNORMAL HIGH (ref 8.9–10.3)
Chloride: 80 mmol/L — ABNORMAL LOW (ref 98–111)
Creatinine, Ser: 1.2 mg/dL — ABNORMAL HIGH (ref 0.44–1.00)
GFR, Estimated: 46 mL/min — ABNORMAL LOW (ref >60)
Glucose, Bld: 130 mg/dL — ABNORMAL HIGH (ref 70–99)
Potassium: 3.4 mmol/L — ABNORMAL LOW (ref 3.5–5.1)
Sodium: 128 mmol/L — ABNORMAL LOW (ref 135–145)
Total Bilirubin: 1.3 mg/dL — ABNORMAL HIGH (ref 0.3–1.2)
Total Protein: 7.2 g/dL (ref 6.5–8.1)

## 2020-06-02 LAB — CBC WITH DIFFERENTIAL/PLATELET
Abs Immature Granulocytes: 0.03 10*3/uL (ref 0.00–0.07)
Basophils Absolute: 0 10*3/uL (ref 0.0–0.1)
Basophils Relative: 0 %
Eosinophils Absolute: 0 10*3/uL (ref 0.0–0.5)
Eosinophils Relative: 0 %
HCT: 40.4 % (ref 36.0–46.0)
Hemoglobin: 14 g/dL (ref 12.0–15.0)
Immature Granulocytes: 0 %
Lymphocytes Relative: 11 %
Lymphs Abs: 1 10*3/uL (ref 0.7–4.0)
MCH: 31.6 pg (ref 26.0–34.0)
MCHC: 34.7 g/dL (ref 30.0–36.0)
MCV: 91.2 fL (ref 80.0–100.0)
Monocytes Absolute: 1 10*3/uL (ref 0.1–1.0)
Monocytes Relative: 12 %
Neutro Abs: 6.6 10*3/uL (ref 1.7–7.7)
Neutrophils Relative %: 77 %
Platelets: 211 10*3/uL (ref 150–400)
RBC: 4.43 MIL/uL (ref 3.87–5.11)
RDW: 11.9 % (ref 11.5–15.5)
WBC: 8.6 10*3/uL (ref 4.0–10.5)
nRBC: 0 % (ref 0.0–0.2)

## 2020-06-02 LAB — PHOSPHORUS: Phosphorus: 5 mg/dL — ABNORMAL HIGH (ref 2.5–4.6)

## 2020-06-02 LAB — LIPASE, BLOOD: Lipase: 42 U/L (ref 11–51)

## 2020-06-02 LAB — MAGNESIUM: Magnesium: 1.7 mg/dL (ref 1.7–2.4)

## 2020-06-02 MED ORDER — SODIUM CHLORIDE 0.9 % IV SOLN: INTRAVENOUS | Status: DC

## 2020-06-02 MED ORDER — CLORAZEPATE DIPOTASSIUM 3.75 MG PO TABS
7.5000 mg | ORAL_TABLET | Freq: Two times a day (BID) | ORAL | Status: DC
  Administered 2020-06-02 – 2020-06-07 (×10): 7.5 mg via ORAL
  Filled 2020-06-02 (×10): qty 2

## 2020-06-02 MED ORDER — LORAZEPAM 2 MG/ML IJ SOLN
0.5000 mg | Freq: Four times a day (QID) | INTRAMUSCULAR | Status: DC | PRN
  Filled 2020-06-02: qty 1

## 2020-06-02 MED ORDER — IOHEXOL 300 MG/ML  SOLN
75.0000 mL | Freq: Once | INTRAMUSCULAR | Status: AC | PRN
  Administered 2020-06-02: 75 mL via INTRAVENOUS

## 2020-06-02 MED ORDER — CLORAZEPATE DIPOTASSIUM 7.5 MG PO TABS: 7.5000 mg | ORAL_TABLET | Freq: Two times a day (BID) | ORAL | Status: DC

## 2020-06-02 MED ORDER — ENOXAPARIN SODIUM 40 MG/0.4ML ~~LOC~~ SOLN
40.0000 mg | SUBCUTANEOUS | Status: DC
  Administered 2020-06-02 – 2020-06-07 (×6): 40 mg via SUBCUTANEOUS
  Filled 2020-06-02 (×6): qty 0.4

## 2020-06-02 MED ORDER — HYDRALAZINE HCL 20 MG/ML IJ SOLN
5.0000 mg | Freq: Four times a day (QID) | INTRAMUSCULAR | Status: DC | PRN
  Administered 2020-06-03 – 2020-06-07 (×3): 5 mg via INTRAVENOUS
  Filled 2020-06-02 (×4): qty 1

## 2020-06-02 MED ORDER — SODIUM CHLORIDE 0.9 % IV BOLUS
1000.0000 mL | Freq: Once | INTRAVENOUS | Status: AC
  Administered 2020-06-02: 1000 mL via INTRAVENOUS

## 2020-06-02 MED ORDER — CLONIDINE HCL 0.1 MG/24HR TD PTWK
0.1000 mg | MEDICATED_PATCH | TRANSDERMAL | Status: DC
  Administered 2020-06-02: 0.1 mg via TRANSDERMAL
  Filled 2020-06-02 (×2): qty 1

## 2020-06-02 MED ORDER — POTASSIUM CHLORIDE 10 MEQ/100ML IV SOLN
10.0000 meq | INTRAVENOUS | Status: AC
  Administered 2020-06-02 (×2): 10 meq via INTRAVENOUS
  Filled 2020-06-02 (×3): qty 100

## 2020-06-02 MED ORDER — HYDROMORPHONE HCL 1 MG/ML IJ SOLN
0.5000 mg | INTRAMUSCULAR | Status: DC | PRN
  Administered 2020-06-07: 1 mg via INTRAVENOUS
  Filled 2020-06-02: qty 1

## 2020-06-02 MED ORDER — ASPIRIN 300 MG RE SUPP
300.0000 mg | Freq: Every day | RECTAL | Status: DC
  Administered 2020-06-02: 300 mg via RECTAL
  Filled 2020-06-02 (×2): qty 1

## 2020-06-02 NOTE — Consult Note (Addendum)
Consult Note  Felicia Sawyer 03-23-1940  [id number].    Requesting MD: Davonna Belling, MD Chief Complaint/Reason for Consult: ventral hernia with nausea and vomiting HPI:  Patient is a 80 year old female who presented to [facility] with 2 day hx of non-radiating abdominal pain of lower abdomen/flanks associated with nausea and vomiting. Denies a history of similar sxs. Reports last BM was Wednesday 4/6. Unsure last episode of flatus but states she has not had flatus for several days. States her last episode of emesis was last night. She denies fever, chills, CP, SOB, urinary sxs. At baseline the patient lives by herself, drives, and walks multiple miles daily for exercise. She denies use of blood thinners. She reports smoking 5-6 cigarettes daily. Denies other alcohol or drug use.   Abdominal surgical history: cholecystectomy, tubal ligation, TAH with vesicovaginal fistula closure, right inguinal hernia repair.   ROS: Review of Systems  Constitutional: Negative.   Respiratory: Negative.   Cardiovascular: Negative.   Gastrointestinal: Positive for abdominal pain, constipation, nausea and vomiting.  All other systems reviewed and are negative.   Family History  Problem Relation Age of Onset  . Kidney disease Mother   . Hypertension Mother   . Heart disease Father   . Diabetes Father   . Hypertension Sister   . Atrial fibrillation Daughter   . CVA Daughter   . Drug abuse Son   . Osteoporosis Daughter   . Hypertension Daughter   . Colon cancer Neg Hx   . Colon polyps Neg Hx   . Rectal cancer Neg Hx   . Stomach cancer Neg Hx   . Esophageal cancer Neg Hx     Past Medical History:  Diagnosis Date  . Abnormality of gait 07/11/2005  . Allergy   . Anxiety state, unspecified 04/08/2012  . Asymptomatic varicose veins 09/24/2011  . Cataract    bilateral  . Chronic venous insufficiency 09/16/2012  . DDD (degenerative disc disease), cervical   . Degenerative and  vascular disorders of ear, unspecified 08/25/2002  . Depressive disorder, not elsewhere classified 11/10/2002  . Diverticulosis of colon (without mention of hemorrhage) 08/25/1989  . Edema 09/24/2011  . Esophageal reflux 11/10/2002   pt denies reflux  . First degree atrioventricular block 07/19/2008  . Heart murmur   . Hypertension   . Inguinal hernia without mention of obstruction or gangrene, unilateral or unspecified, (not specified as recurrent) 11/28/2006  . Insomnia, unspecified 12/15/2007  . Leiomyoma of uterus, unspecified 05/27/2006  . Leukocytopenia, unspecified 08/28/2010  . Lumbago 07/22/2007  . Obesity, unspecified 05/26/1996  . Osteoarthrosis, unspecified whether generalized or localized, unspecified site 05/26/2004  . Other abnormal blood chemistry 07/19/2008  . Other and unspecified hyperlipidemia 10/24/2004  . Palpitations 08/29/2007  . Peptic ulcer, unspecified site, unspecified as acute or chronic, without mention of hemorrhage, perforation, or obstruction 05/27/2006  . Scoliosis (and kyphoscoliosis), idiopathic 05/23/2009  . Symptomatic menopausal or female climacteric states 05/28/1990  . Unspecified essential hypertension 11/13/2004    Past Surgical History:  Procedure Laterality Date  . CHOLECYSTECTOMY  1971  . ENDOMETRIAL BIOPSY  03/1992   Dr. Mallie Mussel  . FOOT SURGERY Left 04/2003   [facility]- Dr. Caffie Pinto  . INGUINAL HERNIA REPAIR Right 03/2007   Dr. Georgette Dover  . KNEE ARTHROSCOPY Left 07/1997   Dr. Wonda Olds  . KNEE SURGERY Right 03/02/2004   Dr. Berenice Primas  . OOPHORECTOMY Right 1974   Dr. Amalia Hailey  . THUMB ARTHROSCOPY Right 1995   Dr.Sypher  .  TONSILLECTOMY AND ADENOIDECTOMY  1975  . TUBAL LIGATION    . VESICOVAGINAL FISTULA CLOSURE W/ TAH  1975    Social History:  reports that she has been smoking cigarettes. She has a 15.00 pack-year smoking history. She has never used smokeless tobacco. She reports that she does not drink alcohol and does not use  drugs.  Allergies:  Allergies  Allergen Reactions  . Caffeine Other (See Comments)    Rapid heart rate   . Demerol [Meperidine]   . Venlafaxine Hcl Other (See Comments)    Hyper  . Pseudoephedrine Palpitations    (Not in a hospital admission)   Blood pressure (!) 147/85, pulse 89, temperature 97.8 F (36.6 C), resp. rate 18, SpO2 94 %. Physical Exam:  General: pleasant, WD, female who is laying in bed in NAD HEENT: head is normocephalic, atraumatic.  Sclera are noninjected.  PERRL.  Ears and nose without any masses or lesions.  Mouth is pink and moist Heart: regular, rate, and rhythm.  Normal s1,s2. No obvious murmurs, gallops, or rubs noted.  Palpable radial and pedal pulses bilaterally Lungs: CTAB, no wheezes, rhonchi, or rales noted.  Respiratory effort nonlabored Abd: soft, mild distention, mildly ttp over ventral hernia, hernia is soft and easily reducible without overlying skin changes, +BS, no masses or organomegaly; previous laparotomy scar superior to umbilicus. MS: all 4 extremities are symmetrical with no cyanosis, clubbing, or edema. Skin: warm and dry with no masses, lesions, or rashes Neuro: Cranial nerves 2-12 grossly intact, sensation is normal throughout Psych: A&Ox3 with an appropriate affect.   Results for orders placed or performed during the hospital encounter of 06/02/20 (from the past 48 hour(s))  Comprehensive metabolic panel     Status: Abnormal   Collection Time: 06/02/20 11:16 AM  Result Value Ref Range   Sodium 128 (L) 135 - 145 mmol/L   Potassium 3.4 (L) 3.5 - 5.1 mmol/L   Chloride 80 (L) 98 - 111 mmol/L   CO2 35 (H) 22 - 32 mmol/L   Glucose, Bld 130 (H) 70 - 99 mg/dL    Comment: Glucose reference range applies only to samples taken after fasting for at least 8 hours.   BUN 19 8 - 23 mg/dL   Creatinine, Ser 1.20 (H) 0.44 - 1.00 mg/dL   Calcium 10.4 (H) 8.9 - 10.3 mg/dL   Total Protein 7.2 6.5 - 8.1 g/dL   Albumin 3.9 3.5 - 5.0 g/dL   AST 25 15  - 41 U/L   ALT 16 0 - 44 U/L   Alkaline Phosphatase 63 38 - 126 U/L   Total Bilirubin 1.3 (H) 0.3 - 1.2 mg/dL   GFR, Estimated 46 (L) >60 mL/min    Comment: (NOTE) Calculated using the CKD-EPI Creatinine Equation (2021)    Anion gap 13 5 - 15    Comment: Performed at [facility] [address], [place], [place] [address]  CBC with Differential     Status: None   Collection Time: 06/02/20 11:16 AM  Result Value Ref Range   WBC 8.6 4.0 - 10.5 K/uL   RBC 4.43 3.87 - 5.11 MIL/uL   Hemoglobin 14.0 12.0 - 15.0 g/dL   HCT 40.4 36.0 - 46.0 %   MCV 91.2 80.0 - 100.0 fL   MCH 31.6 26.0 - 34.0 pg   MCHC 34.7 30.0 - 36.0 g/dL   RDW 11.9 11.5 - 15.5 %   Platelets 211 150 - 400 K/uL   nRBC 0.0 0.0 -  0.2 %   Neutrophils Relative % 77 %   Neutro Abs 6.6 1.7 - 7.7 K/uL   Lymphocytes Relative 11 %   Lymphs Abs 1.0 0.7 - 4.0 K/uL   Monocytes Relative 12 %   Monocytes Absolute 1.0 0.1 - 1.0 K/uL   Eosinophils Relative 0 %   Eosinophils Absolute 0.0 0.0 - 0.5 K/uL   Basophils Relative 0 %   Basophils Absolute 0.0 0.0 - 0.1 K/uL   Immature Granulocytes 0 %   Abs Immature Granulocytes 0.03 0.00 - 0.07 K/uL    Comment: Performed at [facility] [address] [place], [place] [address]  Lipase, blood     Status: None   Collection Time: 06/02/20 11:16 AM  Result Value Ref Range   Lipase 42 11 - 51 U/L    Comment: Performed at [facility], [place] [address], [place], [place] [address]   DG Abdomen Acute W/Chest  Result Date: 06/02/2020 CLINICAL DATA:  Abdominal pain, nausea, and vomiting. Abdominal hernia. EXAM: DG ABDOMEN ACUTE WITH 1 VIEW CHEST COMPARISON:  Chest radiographs 04/02/2007 FINDINGS: The cardiomediastinal silhouette is unchanged with normal heart size. Aortic atherosclerosis is noted. Lung volumes are low with linear opacities in the right greater than left lung bases. There may be small bilateral pleural effusions. No edema or pneumothorax is evident. No  intraperitoneal free air is identified. There are multiple dilated loops of small bowel in the right lower quadrant measuring up to approximately 4 cm in diameter with air-fluid levels. A small amount of gas and stool are present in nondilated colon. Coarse calcifications in the pelvis likely reflect fibroids. There is mild upper lumbar levoscoliosis. IMPRESSION: 1. Dilated small bowel loops in the right lower quadrant consistent with obstruction. 2. Low lung volumes with bibasilar atelectasis and possible small pleural effusions. Electronically Signed   By: Logan Bores M.D.   On: 06/02/2020 13:39   Assessment/Plan HTN HLD Diverticulosis Depression PVD Tobacco use disorder  Abdominal pain with nausea and vomiting, possible SBO This a 80 y/o F with a history of multiple abdominal surgeries and a 2-3 day history of abd pain, nausea, vomiting, and obstipation. CBC unremarkable. Mild AKI and hypokalemia on CMP. Abdominal X-rays ordered by EDP suggested possible SBO and CCS was asked to see. She is non-toxic appearing and has not had vomiting in almost 24 hours. She may have a pSBO vs SBO related to adhesive disease. She also has a ventral hernia but this is soft and reducible and I have a low suspicion that this is contributing to her obstructive symptoms. We will await results of her CT scan. No emergent surgical needs at this time. Would continue NPO for now. She may need an NG tube based on CT scan results or if she shows clinical signs of worsening obstruction (vomiting, increased distention).  Obie Dredge, [facility] 06/02/2020, 2:39 PM Please see Amion for pager number during day hours 7:00am-4:30pm

## 2020-06-02 NOTE — ED Notes (Signed)
Pharmacy at bedside verifying patient meds.

## 2020-06-02 NOTE — ED Provider Notes (Signed)
[facility] EMERGENCY DEPARTMENT Provider Note   CSN: [id number] Arrival date & time: 06/02/20  1053     History No chief complaint on file.   Felicia Sawyer is a 80 y.o. female.  HPI Patient presents with abdominal pain.  Diffuse.  Has had for 2 days.  Has had some nausea and vomiting.  Also states having constipation.  No fevers.  States she has had a hernia near her bellybutton for years.  States she just leaves it alone but has not tried to push it in previously.  Has had previous right inguinal hernia repair.  No blood in the emesis.  Abdomen did feel little more swollen.  He has had previous cholecystectomy.    Past Medical History:  Diagnosis Date  . Abnormality of gait 07/11/2005  . Allergy   . Anxiety state, unspecified 04/08/2012  . Asymptomatic varicose veins 09/24/2011  . Cataract    bilateral  . Chronic venous insufficiency 09/16/2012  . DDD (degenerative disc disease), cervical   . Degenerative and vascular disorders of ear, unspecified 08/25/2002  . Depressive disorder, not elsewhere classified 11/10/2002  . Diverticulosis of colon (without mention of hemorrhage) 08/25/1989  . Edema 09/24/2011  . Esophageal reflux 11/10/2002   pt denies reflux  . First degree atrioventricular block 07/19/2008  . Heart murmur   . Hypertension   . Inguinal hernia without mention of obstruction or gangrene, unilateral or unspecified, (not specified as recurrent) 11/28/2006  . Insomnia, unspecified 12/15/2007  . Leiomyoma of uterus, unspecified 05/27/2006  . Leukocytopenia, unspecified 08/28/2010  . Lumbago 07/22/2007  . Obesity, unspecified 05/26/1996  . Osteoarthrosis, unspecified whether generalized or localized, unspecified site 05/26/2004  . Other abnormal blood chemistry 07/19/2008  . Other and unspecified hyperlipidemia 10/24/2004  . Palpitations 08/29/2007  . Peptic ulcer, unspecified site, unspecified as acute or chronic, without mention of  hemorrhage, perforation, or obstruction 05/27/2006  . Scoliosis (and kyphoscoliosis), idiopathic 05/23/2009  . Symptomatic menopausal or female climacteric states 05/28/1990  . Unspecified essential hypertension 11/13/2004    Patient Active Problem List   Diagnosis Date Noted  . Cortical age-related cataract of both eyes 12/02/2019  . Osteopenia of multiple sites 12/02/2019  . Recurrent major depressive disorder, in partial remission ([place]) 05/05/2017  . Mitral prolapse 07/02/2016  . Special screening for malignant neoplasms, colon 07/25/2015  . Constipation 01/25/2015  . Tobacco use disorder 05/05/2013  . Cervicalgia 02/03/2013  . PVD (peripheral vascular disease) ([place]) 09/16/2012  . Anxiety state 04/08/2012  . Edema 09/24/2011  . Insomnia 12/15/2007  . Lumbago 07/22/2007  . Essential hypertension 11/13/2004  . Hyperlipidemia 10/24/2004    Past Surgical History:  Procedure Laterality Date  . CHOLECYSTECTOMY  1971  . ENDOMETRIAL BIOPSY  03/1992   Dr. Mallie Mussel  . FOOT SURGERY Left 04/2003   [facility]- Dr. Caffie Pinto  . INGUINAL HERNIA REPAIR Right 03/2007   Dr. Georgette Dover  . KNEE ARTHROSCOPY Left 07/1997   Dr. Wonda Olds  . KNEE SURGERY Right 03/02/2004   Dr. Berenice Primas  . OOPHORECTOMY Right 1974   Dr. Amalia Hailey  . THUMB ARTHROSCOPY Right 1995   Dr.Sypher  . TONSILLECTOMY AND ADENOIDECTOMY  1975  . TUBAL LIGATION    . VESICOVAGINAL FISTULA CLOSURE W/ TAH  1975     OB History   No obstetric history on file.     Family History  Problem Relation Age of Onset  . Kidney disease Mother   . Hypertension Mother   . Heart disease Father   .  Diabetes Father   . Hypertension Sister   . Atrial fibrillation Daughter   . CVA Daughter   . Drug abuse Son   . Osteoporosis Daughter   . Hypertension Daughter   . Colon cancer Neg Hx   . Colon polyps Neg Hx   . Rectal cancer Neg Hx   . Stomach cancer Neg Hx   . Esophageal cancer Neg Hx     Social History   Tobacco Use  . Smoking  status: Current Every Day Smoker    Packs/day: 0.25    Years: 60.00    Pack years: 15.00    Types: Cigarettes  . Smokeless tobacco: Never Used  . Tobacco comment: 5 a day   Vaping Use  . Vaping Use: Never used  Substance Use Topics  . Alcohol use: No  . Drug use: No    Home Medications Prior to Admission medications   Medication Sig Start Date End Date Taking? Authorizing Provider  aspirin 81 MG tablet Take 81 mg by mouth daily.     [provider]  cholecalciferol (VITAMIN D) 1000 units tablet Take 1,000 Units by mouth daily.    [provider]  cloNIDine (CATAPRES) 0.1 MG tablet TAKE 1 TABLET BY MOUTH TWICE A DAY TO CONTROL BLOOD PRESSURE 05/08/20   Lauree Chandler, NP  clorazepate (TRANXENE) 7.5 MG tablet TAKE 1 TABLET (7.5 MG TOTAL) BY MOUTH 2 (TWO) TIMES DAILY. 05/11/20   Reed, Tiffany L, DO  fexofenadine (HM FEXOFENADINE HCL) 180 MG tablet Take 180 mg by mouth daily as needed.     [provider]  furosemide (LASIX) 40 MG tablet TAKE 1 TABLET BY MOUTH EVERY DAY 05/08/20   Lauree Chandler, NP  GARLIC PO Take 1 tablet by mouth daily.     [provider]  losartan (COZAAR) 100 MG tablet TAKE 1 TABLET BY MOUTH EVERY DAY FOR BLOOD PRESSURE 05/08/20   Lauree Chandler, NP  Psyllium-Calcium (METAMUCIL PLUS CALCIUM) CAPS Take 3 capsules by mouth 3 (three) times daily.    [provider]  simvastatin (ZOCOR) 40 MG tablet TAKE 1 TABLET BY MOUTH EVERY DAY TO LOWER CHOLESTEROL 05/08/20   Lauree Chandler, NP    Allergies    Caffeine, Demerol [meperidine], Venlafaxine hcl, and Pseudoephedrine  Review of Systems   Review of Systems  Constitutional: Negative for appetite change.  HENT: Negative for congestion.   Respiratory: Negative for shortness of breath.   Cardiovascular: Negative for chest pain.  Gastrointestinal: Positive for abdominal pain, constipation, nausea and vomiting.  Genitourinary: Negative for dysuria.  Musculoskeletal:  Positive for back pain.  Skin: Negative for pallor.  Neurological: Negative for weakness.  Psychiatric/Behavioral: Negative for confusion.    Physical Exam Updated Vital Signs BP (!) 147/85 (BP Location: Right Arm)   Pulse 89   Temp 97.8 F (36.6 C)   Resp 18   SpO2 94%   Physical Exam Vitals and nursing note reviewed.  HENT:     Head: Atraumatic.     Mouth/Throat:     Mouth: Mucous membranes are moist.  Eyes:     Extraocular Movements: Extraocular movements intact.  Cardiovascular:     Rate and Rhythm: Regular rhythm.  Abdominal:     Comments: Mild Diffuse tenderness of abdomen.  Umbilical hernia.  Reduced.  Umbilical hernia was soft.  Musculoskeletal:        General: No tenderness.     Cervical back: Neck supple.  Skin:    General:  Skin is warm.     Capillary Refill: Capillary refill takes less than 2 seconds.  Neurological:     Mental Status: She is alert and oriented to person, place, and time.     ED Results / Procedures / Treatments   Labs (all labs ordered are listed, but only abnormal results are displayed) Labs Reviewed  COMPREHENSIVE METABOLIC PANEL - Abnormal; Notable for the following components:      Result Value   Sodium 128 (*)    Potassium 3.4 (*)    Chloride 80 (*)    CO2 35 (*)    Glucose, Bld 130 (*)    Creatinine, Ser 1.20 (*)    Calcium 10.4 (*)    Total Bilirubin 1.3 (*)    GFR, Estimated 46 (*)    All other components within normal limits  RESP PANEL BY RT-PCR (FLU A&B, COVID) ARPGX2  CBC WITH DIFFERENTIAL/PLATELET  LIPASE, BLOOD    EKG None  Radiology DG Abdomen Acute W/Chest  Result Date: 06/02/2020 CLINICAL DATA:  Abdominal pain, nausea, and vomiting. Abdominal hernia. EXAM: DG ABDOMEN ACUTE WITH 1 VIEW CHEST COMPARISON:  Chest radiographs 04/02/2007 FINDINGS: The cardiomediastinal silhouette is unchanged with normal heart size. Aortic atherosclerosis is noted. Lung volumes are low with linear opacities in the right greater  than left lung bases. There may be small bilateral pleural effusions. No edema or pneumothorax is evident. No intraperitoneal free air is identified. There are multiple dilated loops of small bowel in the right lower quadrant measuring up to approximately 4 cm in diameter with air-fluid levels. A small amount of gas and stool are present in nondilated colon. Coarse calcifications in the pelvis likely reflect fibroids. There is mild upper lumbar levoscoliosis. IMPRESSION: 1. Dilated small bowel loops in the right lower quadrant consistent with obstruction. 2. Low lung volumes with bibasilar atelectasis and possible small pleural effusions. Electronically Signed   By: Logan Bores M.D.   On: 06/02/2020 13:39    Procedures Procedures   Medications Ordered in ED Medications  sodium chloride 0.9 % bolus 1,000 mL (1,000 mLs Intravenous New Bag/Given 06/02/20 1319)    ED Course  I have reviewed the triage vital signs and the nursing notes.  Pertinent labs & imaging results that were available during my care of the patient were reviewed by me and considered in my medical decision making (see chart for details).    MDM Rules/Calculators/A&P                          Patient presents with nausea vomiting abdominal pain.  Has umbilical hernia that initially was reducible.  However did recur.  Has been vomiting for 2 days with constipation.  I think the electrolyte abnormalities are from the vomiting.  Potential obstruction likely from the hernia.  Will discuss with general surgery.  X-ray showed potential bowel obstruction.  Will admit to internal medicine since the medical complexity would probably benefit from their involvement.  Discussed with general surgery.  Requested CT scan but they will evaluate patient prior to the scan.   Final Clinical Impression(s) / ED Diagnoses Final diagnoses:  Dehydration  Umbilical hernia with obstruction, without gangrene    Rx / DC Orders ED Discharge Orders     None       Davonna Belling, MD 06/02/20 1421

## 2020-06-02 NOTE — ED Triage Notes (Signed)
Emergency Medicine Provider Triage Evaluation Note  Felicia Sawyer , a 80 y.o. female  was evaluated in triage.  Pt complains of pain with abdominal hernia.   Review of Systems  Positive; Abdominal pain, vomiting Negative: fever  Physical Exam  There were no vitals taken for this visit. Gen:   Awake, no distress   HEENT:  Atraumatic  Resp:  Normal effort  Cardiac:  Normal rate Abd:   Abomdinal hernia non reducible , no erythema MSK:   Moves extremities without difficulty  Neuro:  Speech clear   Medical Decision Making  Medically screening exam initiated at 11:06 AM.  Appropriate orders placed.  Felicia Sawyer was informed that the remainder of the evaluation will be completed by another provider, this initial triage assessment does not replace that evaluation, and the importance of remaining in the ED until their evaluation is complete.  Clinical Impression  Hernia, stable.  No fevers.    Alfredia Client, PA-C 06/02/20 1110

## 2020-06-02 NOTE — H&P (Signed)
History and Physical    Felicia Sawyer LYY:[id number] DOB: Mar 18, 1940 DOA: 06/02/2020  PCP: Lauree Chandler, NP (Confirm with patient/family/NH records and if not entered, this has to be entered at [facility] point of entry) Patient coming from: Home  I have personally briefly reviewed patient's old medical records in [place]  Chief Complaint: Belly hurts  HPI: Felicia Sawyer is a 80 y.o. female with medical history significant of HTN, HLD, anxiety depression, presented with new onset of abdominal pain.  Patient has umbilical hernia "for many years" but since 3 days ago, the local area started to have pain then she developed and diffused cramping like abdominal pain associated with frequent nausea and vomited 2-3 times of stomach content.  She has been constipated for last few days as well.  Denies any fever chills, no chest pain or shortness of breath.  History of intra-abdominal surgery involving cholecystectomy and inguinal hernia repair. Patient also has chronic constipation, moves her bowels every 3-5 days.  ED Course: Umbilical hernia was reducible however with significant tenderness and feeling nausea but no vomiting.  X-ray compatible with small bowel obstruction.  Labs showed hypokalemia and hypochlorinemia.  Review of Systems: As per HPI otherwise 14 point review of systems negative.    Past Medical History:  Diagnosis Date  . Abnormality of gait 07/11/2005  . Allergy   . Anxiety state, unspecified 04/08/2012  . Asymptomatic varicose veins 09/24/2011  . Cataract    bilateral  . Chronic venous insufficiency 09/16/2012  . DDD (degenerative disc disease), cervical   . Degenerative and vascular disorders of ear, unspecified 08/25/2002  . Depressive disorder, not elsewhere classified 11/10/2002  . Diverticulosis of colon (without mention of hemorrhage) 08/25/1989  . Edema 09/24/2011  . Esophageal reflux 11/10/2002   pt denies reflux  . First degree  atrioventricular block 07/19/2008  . Heart murmur   . Hypertension   . Inguinal hernia without mention of obstruction or gangrene, unilateral or unspecified, (not specified as recurrent) 11/28/2006  . Insomnia, unspecified 12/15/2007  . Leiomyoma of uterus, unspecified 05/27/2006  . Leukocytopenia, unspecified 08/28/2010  . Lumbago 07/22/2007  . Obesity, unspecified 05/26/1996  . Osteoarthrosis, unspecified whether generalized or localized, unspecified site 05/26/2004  . Other abnormal blood chemistry 07/19/2008  . Other and unspecified hyperlipidemia 10/24/2004  . Palpitations 08/29/2007  . Peptic ulcer, unspecified site, unspecified as acute or chronic, without mention of hemorrhage, perforation, or obstruction 05/27/2006  . Scoliosis (and kyphoscoliosis), idiopathic 05/23/2009  . Symptomatic menopausal or female climacteric states 05/28/1990  . Unspecified essential hypertension 11/13/2004    Past Surgical History:  Procedure Laterality Date  . CHOLECYSTECTOMY  1971  . ENDOMETRIAL BIOPSY  03/1992   Dr. Mallie Mussel  . FOOT SURGERY Left 04/2003   [facility]- Dr. Caffie Pinto  . INGUINAL HERNIA REPAIR Right 03/2007   Dr. Georgette Dover  . KNEE ARTHROSCOPY Left 07/1997   Dr. Wonda Olds  . KNEE SURGERY Right 03/02/2004   Dr. Berenice Primas  . OOPHORECTOMY Right 1974   Dr. Amalia Hailey  . THUMB ARTHROSCOPY Right 1995   Dr.Sypher  . TONSILLECTOMY AND ADENOIDECTOMY  1975  . TUBAL LIGATION    . VESICOVAGINAL FISTULA CLOSURE W/ TAH  1975     reports that she has been smoking cigarettes. She has a 15.00 pack-year smoking history. She has never used smokeless tobacco. She reports that she does not drink alcohol and does not use drugs.  Allergies  Allergen Reactions  . Caffeine Other (See Comments)  Rapid heart rate   . Demerol [Meperidine]   . Venlafaxine Hcl Other (See Comments)    Hyper  . Pseudoephedrine Palpitations    Family History  Problem Relation Age of Onset  . Kidney disease Mother   .  Hypertension Mother   . Heart disease Father   . Diabetes Father   . Hypertension Sister   . Atrial fibrillation Daughter   . CVA Daughter   . Drug abuse Son   . Osteoporosis Daughter   . Hypertension Daughter   . Colon cancer Neg Hx   . Colon polyps Neg Hx   . Rectal cancer Neg Hx   . Stomach cancer Neg Hx   . Esophageal cancer Neg Hx      Prior to Admission medications   Medication Sig Start Date End Date Taking? Authorizing Provider  aspirin 81 MG tablet Take 81 mg by mouth daily.     [provider]  cholecalciferol (VITAMIN D) 1000 units tablet Take 1,000 Units by mouth daily.    [provider]  cloNIDine (CATAPRES) 0.1 MG tablet TAKE 1 TABLET BY MOUTH TWICE A DAY TO CONTROL BLOOD PRESSURE 05/08/20   Lauree Chandler, NP  clorazepate (TRANXENE) 7.5 MG tablet TAKE 1 TABLET (7.5 MG TOTAL) BY MOUTH 2 (TWO) TIMES DAILY. 05/11/20   Reed, Tiffany L, DO  fexofenadine (HM FEXOFENADINE HCL) 180 MG tablet Take 180 mg by mouth daily as needed.     [provider]  furosemide (LASIX) 40 MG tablet TAKE 1 TABLET BY MOUTH EVERY DAY 05/08/20   Lauree Chandler, NP  GARLIC PO Take 1 tablet by mouth daily.     [provider]  losartan (COZAAR) 100 MG tablet TAKE 1 TABLET BY MOUTH EVERY DAY FOR BLOOD PRESSURE 05/08/20   Lauree Chandler, NP  Psyllium-Calcium (METAMUCIL PLUS CALCIUM) CAPS Take 3 capsules by mouth 3 (three) times daily.    [provider]  simvastatin (ZOCOR) 40 MG tablet TAKE 1 TABLET BY MOUTH EVERY DAY TO LOWER CHOLESTEROL 05/08/20   Lauree Chandler, NP    Physical Exam: Vitals:   06/02/20 1108  BP: (!) 147/85  Pulse: 89  Resp: 18  Temp: 97.8 F (36.6 C)  SpO2: 94%    Constitutional: NAD, calm, comfortable Vitals:   06/02/20 1108  BP: (!) 147/85  Pulse: 89  Resp: 18  Temp: 97.8 F (36.6 C)  SpO2: 94%   Eyes: PERRL, lids and conjunctivae normal ENMT: Mucous membranes are dry. Posterior pharynx clear of any  exudate or lesions.Normal dentition.  Neck: normal, supple, no masses, no thyromegaly Respiratory: clear to auscultation bilaterally, no wheezing, no crackles. Normal respiratory effort. No accessory muscle use.  Cardiovascular: Regular rate and rhythm, no murmurs / rubs / gallops. No extremity edema. 2+ pedal pulses. No carotid bruits.  Abdomen: Umbilical hernia with tenderness around the area, bowel sounds present on all quadrants. No hepatosplenomegaly. Bowel sounds positive.  Musculoskeletal: no clubbing / cyanosis. No joint deformity upper and lower extremities. Good ROM, no contractures. Normal muscle tone.  Skin: no rashes, lesions, ulcers. No induration Neurologic: CN 2-12 grossly intact. Sensation intact, DTR normal. Strength 5/5 in all 4.  Psychiatric: Normal judgment and insight. Alert and oriented x 3. Normal mood.     Labs on Admission: I have personally reviewed following labs and imaging studies  CBC: Recent Labs  Lab 06/02/20 1116  WBC 8.6  NEUTROABS 6.6  HGB 14.0  HCT 40.4  MCV 91.2  PLT 673   Basic Metabolic Panel: Recent Labs  Lab 06/02/20 1116  NA 128*  K 3.4*  CL 80*  CO2 35*  GLUCOSE 130*  BUN 19  CREATININE 1.20*  CALCIUM 10.4*   GFR: CrCl cannot be calculated (Unknown ideal weight.). Liver Function Tests: Recent Labs  Lab 06/02/20 1116  AST 25  ALT 16  ALKPHOS 63  BILITOT 1.3*  PROT 7.2  ALBUMIN 3.9   Recent Labs  Lab 06/02/20 1116  LIPASE 42   No results for input(s): AMMONIA in the last 168 hours. Coagulation Profile: No results for input(s): INR, PROTIME in the last 168 hours. Cardiac Enzymes: No results for input(s): CKTOTAL, CKMB, CKMBINDEX, TROPONINI in the last 168 hours. BNP (last 3 results) No results for input(s): PROBNP in the last 8760 hours. HbA1C: No results for input(s): HGBA1C in the last 72 hours. CBG: No results for input(s): GLUCAP in the last 168 hours. Lipid Profile: No results for input(s): CHOL, HDL,  LDLCALC, TRIG, CHOLHDL, LDLDIRECT in the last 72 hours. Thyroid Function Tests: No results for input(s): TSH, T4TOTAL, FREET4, T3FREE, THYROIDAB in the last 72 hours. Anemia Panel: No results for input(s): VITAMINB12, FOLATE, FERRITIN, TIBC, IRON, RETICCTPCT in the last 72 hours. Urine analysis: No results found for: COLORURINE, APPEARANCEUR, [place], [place], GLUCOSEU, HGBUR, BILIRUBINUR, KETONESUR, PROTEINUR, UROBILINOGEN, NITRITE, LEUKOCYTESUR  Radiological Exams on Admission: DG Abdomen Acute W/Chest  Result Date: 06/02/2020 CLINICAL DATA:  Abdominal pain, nausea, and vomiting. Abdominal hernia. EXAM: DG ABDOMEN ACUTE WITH 1 VIEW CHEST COMPARISON:  Chest radiographs 04/02/2007 FINDINGS: The cardiomediastinal silhouette is unchanged with normal heart size. Aortic atherosclerosis is noted. Lung volumes are low with linear opacities in the right greater than left lung bases. There may be small bilateral pleural effusions. No edema or pneumothorax is evident. No intraperitoneal free air is identified. There are multiple dilated loops of small bowel in the right lower quadrant measuring up to approximately 4 cm in diameter with air-fluid levels. A small amount of gas and stool are present in nondilated colon. Coarse calcifications in the pelvis likely reflect fibroids. There is mild upper lumbar levoscoliosis. IMPRESSION: 1. Dilated small bowel loops in the right lower quadrant consistent with obstruction. 2. Low lung volumes with bibasilar atelectasis and possible small pleural effusions. Electronically Signed   By: Logan Bores M.D.   On: 06/02/2020 13:39    EKG: Ordered  Assessment/Plan Active Problems:   SBO (small bowel obstruction) ([place])  (please populate well all problems here in Problem List. (For example, if patient is on BP meds at home and you resume or decide to hold them, it is a problem that needs to be her. Same for CAD, COPD, HLD and so on)  SBO -Probably a combined effect of  umbilical hernia and constipation and probably adhesions from previous intra-abd surgeries. -NPO, NGT as per Surgery. CT abd pending.  HTN -Expect cannot take p.o. BP meds, switch to as needed IV hydralazine for now. -Change po clonidine to patch form  Hypokalemia -Replace and recheck.  Check magnesium and phosphorus  Hypovolemia/dehydration -From SBO -Start IVF  Anxiety depression -Discussed with daughter at bedside who insisted continue to take clonazepam. Will add PRN Ativan in case not able to take pill.  DVT prophylaxis: Lovenox Code Status: Full code Family Communication: Daughter at bedside Disposition Plan: Expect more than 2 days hospital stay Consults called: General surgery Admission status: MedSurg   Lequita Halt MD [facility] Pager [phone]  06/02/2020, 2:53 PM

## 2020-06-02 NOTE — Plan of Care (Signed)
  Problem: Clinical Measurements: Goal: Will remain free from infection Outcome: Progressing   Problem: Clinical Measurements: Goal: Ability to maintain clinical measurements within normal limits will improve Outcome: Progressing   Problem: Clinical Measurements: Goal: Will remain free from infection Outcome: Progressing   Problem: Education: Goal: Knowledge of General Education information will improve Description: Including pain rating scale, medication(s)/side effects and non-pharmacologic comfort measures Outcome: Progressing

## 2020-06-02 NOTE — ED Triage Notes (Signed)
Pt here from home with c/o abd pain from a henria that is protruding out at her navel with some n/v

## 2020-06-03 ENCOUNTER — Inpatient Hospital Stay (HOSPITAL_COMMUNITY): Payer: Medicare HMO

## 2020-06-03 LAB — BASIC METABOLIC PANEL
Anion gap: 8 (ref 5–15)
BUN: 19 mg/dL (ref 8–23)
CO2: 33 mmol/L — ABNORMAL HIGH (ref 22–32)
Calcium: 9.4 mg/dL (ref 8.9–10.3)
Chloride: 89 mmol/L — ABNORMAL LOW (ref 98–111)
Creatinine, Ser: 1 mg/dL (ref 0.44–1.00)
GFR, Estimated: 58 mL/min — ABNORMAL LOW (ref >60)
Glucose, Bld: 106 mg/dL — ABNORMAL HIGH (ref 70–99)
Potassium: 3.4 mmol/L — ABNORMAL LOW (ref 3.5–5.1)
Sodium: 130 mmol/L — ABNORMAL LOW (ref 135–145)

## 2020-06-03 MED ORDER — DIATRIZOATE MEGLUMINE & SODIUM 66-10 % PO SOLN
90.0000 mL | Freq: Once | ORAL | Status: AC
  Administered 2020-06-03: 90 mL via NASOGASTRIC
  Filled 2020-06-03: qty 90

## 2020-06-03 MED ORDER — ONDANSETRON HCL 4 MG/2ML IJ SOLN
INTRAMUSCULAR | Status: AC
  Filled 2020-06-03: qty 2

## 2020-06-03 MED ORDER — ONDANSETRON HCL 4 MG/2ML IJ SOLN
4.0000 mg | Freq: Four times a day (QID) | INTRAMUSCULAR | Status: DC | PRN
  Administered 2020-06-03: 4 mg via INTRAVENOUS

## 2020-06-03 MED ORDER — KCL IN DEXTROSE-NACL 20-5-0.45 MEQ/L-%-% IV SOLN
INTRAVENOUS | Status: DC
  Filled 2020-06-03 (×9): qty 1000

## 2020-06-03 NOTE — Progress Notes (Signed)
Assessment & Plan: HD#2 - small bowel obstruction likely secondary to adhesions  Gastric distension on CT, emesis x 2 overnight - will request NG tube placement  AXR after NG placed this AM  NPO, IVF  Encourage OOB, ambulation        Armandina Gemma, MD       [facility]       Office: [phone]   Chief Complaint: SBO  Subjective: Patient in bed, emesis twice overnight.  Denies abdominal pain.  Objective: Vital signs in last 24 hours: Temp:  [97.8 F (36.6 C)-99.5 F (37.5 C)] 99.5 F (37.5 C) (04/09 0217) Pulse Rate:  [78-89] 81 (04/09 0217) Resp:  [15-19] 17 (04/09 0217) BP: (131-202)/(69-108) 181/69 (04/09 0217) SpO2:  [88 %-100 %] 99 % (04/09 0217) Last BM Date: 05/31/20  Intake/Output from previous day: 04/08 0701 - 04/09 0700 In: 3285.4 [I.V.:1600; IV Piggyback:1685.4] Out: -  Intake/Output this shift: No intake/output data recorded.  Physical Exam: HEENT - sclerae clear, mucous membranes moist Neck - soft Abdomen - soft, protuberant; ventral hernia fully reduced, 2 cm fascial defect Ext - no edema, non-tender Neuro - alert & oriented, no focal deficits  Lab Results:  Recent Labs    06/02/20 1116  WBC 8.6  HGB 14.0  HCT 40.4  PLT 211   BMET Recent Labs    06/02/20 1116 06/03/20 0100  NA 128* 130*  K 3.4* 3.4*  CL 80* 89*  CO2 35* 33*  GLUCOSE 130* 106*  BUN 19 19  CREATININE 1.20* 1.00  CALCIUM 10.4* 9.4   PT/INR No results for input(s): LABPROT, INR in the last 72 hours. Comprehensive Metabolic Panel:    Component Value Date/Time   NA 130 (L) 06/03/2020 0100   NA 128 (L) 06/02/2020 1116   NA 141 01/23/2015 0837   NA 142 11/25/2013 0913   K 3.4 (L) 06/03/2020 0100   K 3.4 (L) 06/02/2020 1116   CL 89 (L) 06/03/2020 0100   CL 80 (L) 06/02/2020 1116   CO2 33 (H) 06/03/2020 0100   CO2 35 (H) 06/02/2020 1116   BUN 19 06/03/2020 0100   BUN 19 06/02/2020 1116   BUN 12 01/23/2015 0837   BUN 16 11/25/2013 0913    CREATININE 1.00 06/03/2020 0100   CREATININE 1.20 (H) 06/02/2020 1116   CREATININE 0.80 12/02/2019 1423   CREATININE 0.95 (H) 05/04/2018 0805   GLUCOSE 106 (H) 06/03/2020 0100   GLUCOSE 130 (H) 06/02/2020 1116   CALCIUM 9.4 06/03/2020 0100   CALCIUM 10.4 (H) 06/02/2020 1116   AST 25 06/02/2020 1116   AST 17 12/02/2019 1423   ALT 16 06/02/2020 1116   ALT 13 12/02/2019 1423   ALKPHOS 63 06/02/2020 1116   ALKPHOS 59 01/26/2016 0911   BILITOT 1.3 (H) 06/02/2020 1116   BILITOT 0.5 12/02/2019 1423   BILITOT 0.5 01/23/2015 0837   PROT 7.2 06/02/2020 1116   PROT 6.4 12/02/2019 1423   PROT 6.3 01/23/2015 0837   PROT 6.6 11/25/2013 0913   ALBUMIN 3.9 06/02/2020 1116   ALBUMIN 3.6 01/26/2016 0911   ALBUMIN 3.7 01/23/2015 0837   ALBUMIN 4.0 11/25/2013 0913    Studies/Results: CT ABDOMEN PELVIS W CONTRAST  Result Date: 06/02/2020 CLINICAL DATA:  Bowel obstruction suspected. EXAM: CT ABDOMEN AND PELVIS WITH CONTRAST TECHNIQUE: Multidetector CT imaging of the abdomen and pelvis was performed using the standard protocol following bolus administration of intravenous contrast. CONTRAST:  76mL OMNIPAQUE IOHEXOL 300 MG/ML  SOLN COMPARISON:  None. FINDINGS: Lower chest: Bilateral trace pleural effusions. Bilateral lower lobe passive atelectasis. Bibasilar atelectasis versus scarring. The esophagus is dilated with fluid. Hepatobiliary: No focal liver abnormality. Status post cholecystectomy with intra and extrahepatic biliary ductal dilatation which can be seen in the post cholecystectomy setting. Pancreas: No focal lesion. Normal pancreatic contour. No surrounding inflammatory changes. No main pancreatic ductal dilatation. Spleen: Normal in size without focal abnormality. Adrenals/Urinary Tract: No adrenal nodule bilaterally. Bilateral kidneys enhance symmetrically. Fluid density lesions likely represent simple renal cysts. Subcentimeter hypodensities are too small to characterize. There is a 1.1 cm  hypodensity within left kidney that demonstrates a density of 29 Hounsfield units. No hydronephrosis. No hydroureter. The urinary bladder is unremarkable. Stomach/Bowel: Fluid distension of the gastric lumen. Fluid distension of the proximal and mid small bowel up to 3.3 cm with a transition point in the left lower mid abdomen (6:27, 3:64). Small bowel distally is collapsed. No bowel thickening or pneumatosis. No large bowel dilatation or bowel wall thickening. Stool is noted within the ascending colon. Diffuse sigmoid diverticulosis. The appendix is not definitely identified. Vascular/Lymphatic: No abdominal aorta or iliac aneurysm. Severe atherosclerotic plaque of the aorta and its branches. No abdominal, pelvic, or inguinal lymphadenopathy. Reproductive: Multiple coarsely calcified lesions within the urine walls likely represent fibroids. Otherwise the uterus and bilateral adnexa are unremarkable. Other: No intraperitoneal free fluid. No intraperitoneal free gas. No organized fluid collection. Musculoskeletal: Small fat containing umbilical hernia with an abdominal defect of 2.2 cm. No suspicious lytic or blastic osseous lesions. No acute displaced fracture. Multilevel degenerative changes of the spine. Grade 1 anterolisthesis of L4 on L5. IMPRESSION: 1. Small-bowel obstruction with a transition point within the left lower mid abdomen. Recommend enteric tube placement given fluid dilatation of the visualized distal esophagus. 2. Bilateral trace pleural effusions. 3. Indeterminate 1.1 cm left renal hypodensity. Recommend MRI renal protocol for further evaluation. 4. Small fat containing umbilical hernia with an abdominal defect of 2.2 cm. No findings suggest ischemia or associated bowel obstruction. 5. Diffuse sigmoid diverticulosis with no acute diverticulitis. 6. Multiple uterine fibroids. 7.  Aortic Atherosclerosis (ICD10-I70.0). These results will be called to the ordering clinician or representative by the  Radiologist Assistant, and communication documented in the PACS or [employer]. Electronically Signed   By: Iven Finn M.D.   On: 06/02/2020 23:27   DG Abdomen Acute W/Chest  Result Date: 06/02/2020 CLINICAL DATA:  Abdominal pain, nausea, and vomiting. Abdominal hernia. EXAM: DG ABDOMEN ACUTE WITH 1 VIEW CHEST COMPARISON:  Chest radiographs 04/02/2007 FINDINGS: The cardiomediastinal silhouette is unchanged with normal heart size. Aortic atherosclerosis is noted. Lung volumes are low with linear opacities in the right greater than left lung bases. There may be small bilateral pleural effusions. No edema or pneumothorax is evident. No intraperitoneal free air is identified. There are multiple dilated loops of small bowel in the right lower quadrant measuring up to approximately 4 cm in diameter with air-fluid levels. A small amount of gas and stool are present in nondilated colon. Coarse calcifications in the pelvis likely reflect fibroids. There is mild upper lumbar levoscoliosis. IMPRESSION: 1. Dilated small bowel loops in the right lower quadrant consistent with obstruction. 2. Low lung volumes with bibasilar atelectasis and possible small pleural effusions. Electronically Signed   By: Logan Bores M.D.   On: 06/02/2020 13:39      Armandina Gemma 06/03/2020  Patient ID: Felicia Sawyer, female   DOB: 1940/04/09, 80 y.o.   MRN:  [id number]  

## 2020-06-03 NOTE — Plan of Care (Signed)

## 2020-06-03 NOTE — Progress Notes (Signed)
Pt's daughter, York Cerise, called for update on pt status. All questions answered to satisfaction. Will continue to monitor.

## 2020-06-03 NOTE — Progress Notes (Signed)
[facility] Progress Note  Patient: Felicia Sawyer    UVO:[id number]  DOA: 06/02/2020     Date of Service: the patient was seen and examined on 06/03/2020  Brief hospital course: Past medical history of HTN, HLD, anxiety, depression, history of cholecystectomy and inguinal hernia repair chronic umbilical hernia.  Presents with complaints of abdominal pain.  Found to have SBO. Currently plan is conservative measures for SBO.  Follow-up surgery recommendation.  Assessment and Plan: 1.  Small bowel obstruction CT abdomen with contrast shows SBO with transition point within the left lower midabdomen. NG tube inserted. Currently on IV fluids. General surgery consulted. N.p.o.  2.  Essential hypertension with hypertensive urgency Wide pulse pressure 112 secondary to isolated systolic hypertension Blood pressure elevated. On clonidine 0.1 mg twice daily at home. Currently on clonidine patch 0.1. Also on Lasix and losartan which is currently on hold. As needed hydralazine. Monitor for improvement in blood pressure.  Currently no indication for further work-up as no symptoms but will benefit from echocardiogram outpatient  3.  Bilateral pleural effusion with bibasilar atelectasis Seen incidentally on CT scan. Incentive spirometry.  Currently not hypoxic.  No evidence of volume overload.  4.  Acute kidney injury  Baseline serum creatinine 0.8.  On presentation serum creatinine 1.2.  Evidence of hemoconcentration. Currently continue IV fluid.  5.  Hypokalemia Mild hyponatremia.  No significant Replace potassium.  Maintain K more than 4.  Sodium minimally low likely in the setting of poor p.o. intake. Continue to replace with IV fluids.  6. 1.1 cm left renal hypodensity Seen incidentally. MRI renal protocol recommended.  7. Anxiety and depression  Continue clorazepate per home dose per patient desire. Continue as needed Ativan IV.  8. Hyperlipidemia Holding  simvastatin.  9. Uterine fibroids Incidentally seen on the CT scan. Outpatient follow-up with GYN.  10. Umbilical hernia Without any ischemia or obstruction  11.  Patient is on aspirin at home. Currently I do not see any recent history of CAD or CVA. In the setting of SBO holding it.  Diet: N.p.o. DVT Prophylaxis:   enoxaparin (LOVENOX) injection 40 mg Start: 06/02/20 1500    Advance goals of care discussion: Full code  Family Communication: no family was present at bedside, at the time of interview.  Family member was available on the phone. The pt provided permission to discuss medical plan with the family. Opportunity was given to ask question and all questions were answered satisfactorily.   Disposition:  Status is: Inpatient  Remains inpatient appropriate because:IV treatments appropriate due to intensity of illness or inability to take PO   Dispo: The patient is from: Home              Anticipated d/c is to: Home              Patient currently is not medically stable to d/c.   Difficult to place patient No  Subjective: NG tube inserted.  Tolerating well.  No bleeding.  Not passing gas.  No BM.  Abdominal pain improving.  No shortness of breath.  Physical Exam:  General: Appear in mild distress, no Rash; Oral Mucosa Clear, moist. no Abnormal Neck Mass Or lumps, Conjunctiva normal  Cardiovascular: S1 and S2 Present, aortic systolic Murmur, Respiratory: good respiratory effort, Bilateral Air entry present and CTA, no Crackles, no wheezes Abdomen: Bowel Sound present, Soft and mild tenderness Extremities: no Pedal edema Neurology: alert and oriented to time, place, and person affect appropriate. no new focal  deficit Gait not checked due to patient safety concerns  Vitals:   06/02/20 1600 06/02/20 1629 06/02/20 1652 06/03/20 0217  BP: (!) 174/84  (!) 186/82 (!) 181/69  Pulse: 88  83 81  Resp: 16  16 17   Temp:  98.4 F (36.9 C) 98.3 F (36.8 C) 99.5 F (37.5 C)   TempSrc:  Oral Oral Oral  SpO2: (!) 88%  100% 99%    Intake/Output Summary (Last 24 hours) at 06/03/2020 0715 Last data filed at 06/03/2020 0617 Gross per 24 hour  Intake 3285.41 ml  Output --  Net 3285.41 ml   There were no vitals filed for this visit.  Data Reviewed: I have personally reviewed and interpreted daily labs, tele strips, imaging. I reviewed all nursing notes, pharmacy notes, vitals, pertinent old records I have discussed plan of care as described above with RN and patient/family.  CBC: Recent Labs  Lab 06/02/20 1116  WBC 8.6  NEUTROABS 6.6  HGB 14.0  HCT 40.4  MCV 91.2  PLT 440   Basic Metabolic Panel: Recent Labs  Lab 06/02/20 1116 06/03/20 0100  NA 128* 130*  K 3.4* 3.4*  CL 80* 89*  CO2 35* 33*  GLUCOSE 130* 106*  BUN 19 19  CREATININE 1.20* 1.00  CALCIUM 10.4* 9.4  MG 1.7  --   PHOS 5.0*  --     Studies: CT ABDOMEN PELVIS W CONTRAST  Result Date: 06/02/2020 CLINICAL DATA:  Bowel obstruction suspected. EXAM: CT ABDOMEN AND PELVIS WITH CONTRAST TECHNIQUE: Multidetector CT imaging of the abdomen and pelvis was performed using the standard protocol following bolus administration of intravenous contrast. CONTRAST:  18mL OMNIPAQUE IOHEXOL 300 MG/ML  SOLN COMPARISON:  None. FINDINGS: Lower chest: Bilateral trace pleural effusions. Bilateral lower lobe passive atelectasis. Bibasilar atelectasis versus scarring. The esophagus is dilated with fluid. Hepatobiliary: No focal liver abnormality. Status post cholecystectomy with intra and extrahepatic biliary ductal dilatation which can be seen in the post cholecystectomy setting. Pancreas: No focal lesion. Normal pancreatic contour. No surrounding inflammatory changes. No main pancreatic ductal dilatation. Spleen: Normal in size without focal abnormality. Adrenals/Urinary Tract: No adrenal nodule bilaterally. Bilateral kidneys enhance symmetrically. Fluid density lesions likely represent simple renal cysts.  Subcentimeter hypodensities are too small to characterize. There is a 1.1 cm hypodensity within left kidney that demonstrates a density of 29 Hounsfield units. No hydronephrosis. No hydroureter. The urinary bladder is unremarkable. Stomach/Bowel: Fluid distension of the gastric lumen. Fluid distension of the proximal and mid small bowel up to 3.3 cm with a transition point in the left lower mid abdomen (6:27, 3:64). Small bowel distally is collapsed. No bowel thickening or pneumatosis. No large bowel dilatation or bowel wall thickening. Stool is noted within the ascending colon. Diffuse sigmoid diverticulosis. The appendix is not definitely identified. Vascular/Lymphatic: No abdominal aorta or iliac aneurysm. Severe atherosclerotic plaque of the aorta and its branches. No abdominal, pelvic, or inguinal lymphadenopathy. Reproductive: Multiple coarsely calcified lesions within the urine walls likely represent fibroids. Otherwise the uterus and bilateral adnexa are unremarkable. Other: No intraperitoneal free fluid. No intraperitoneal free gas. No organized fluid collection. Musculoskeletal: Small fat containing umbilical hernia with an abdominal defect of 2.2 cm. No suspicious lytic or blastic osseous lesions. No acute displaced fracture. Multilevel degenerative changes of the spine. Grade 1 anterolisthesis of L4 on L5. IMPRESSION: 1. Small-bowel obstruction with a transition point within the left lower mid abdomen. Recommend enteric tube placement given fluid dilatation of the visualized distal esophagus. 2. Bilateral  trace pleural effusions. 3. Indeterminate 1.1 cm left renal hypodensity. Recommend MRI renal protocol for further evaluation. 4. Small fat containing umbilical hernia with an abdominal defect of 2.2 cm. No findings suggest ischemia or associated bowel obstruction. 5. Diffuse sigmoid diverticulosis with no acute diverticulitis. 6. Multiple uterine fibroids. 7.  Aortic Atherosclerosis (ICD10-I70.0). These  results will be called to the ordering clinician or representative by the Radiologist Assistant, and communication documented in the PACS or [employer]. Electronically Signed   By: Iven Finn M.D.   On: 06/02/2020 23:27   DG Abdomen Acute W/Chest  Result Date: 06/02/2020 CLINICAL DATA:  Abdominal pain, nausea, and vomiting. Abdominal hernia. EXAM: DG ABDOMEN ACUTE WITH 1 VIEW CHEST COMPARISON:  Chest radiographs 04/02/2007 FINDINGS: The cardiomediastinal silhouette is unchanged with normal heart size. Aortic atherosclerosis is noted. Lung volumes are low with linear opacities in the right greater than left lung bases. There may be small bilateral pleural effusions. No edema or pneumothorax is evident. No intraperitoneal free air is identified. There are multiple dilated loops of small bowel in the right lower quadrant measuring up to approximately 4 cm in diameter with air-fluid levels. A small amount of gas and stool are present in nondilated colon. Coarse calcifications in the pelvis likely reflect fibroids. There is mild upper lumbar levoscoliosis. IMPRESSION: 1. Dilated small bowel loops in the right lower quadrant consistent with obstruction. 2. Low lung volumes with bibasilar atelectasis and possible small pleural effusions. Electronically Signed   By: Logan Bores M.D.   On: 06/02/2020 13:39    Scheduled Meds: . cloNIDine  0.1 mg Transdermal Weekly  . clorazepate  7.5 mg Oral BID  . enoxaparin (LOVENOX) injection  40 mg Subcutaneous Q24H   Continuous Infusions: . sodium chloride 100 mL/hr at 06/03/20 0515   PRN Meds: hydrALAZINE, HYDROmorphone (DILAUDID) injection, LORazepam, ondansetron (ZOFRAN) IV  Time spent: 35 minutes  Author: Berle Mull, MD [facility] 06/03/2020 7:15 AM  To reach On-call, see care teams to locate the attending and reach out via [email or web address]. Between 7PM-7AM, please contact night-coverage If you still have difficulty reaching the attending  provider, please page the [facility] (Director on Call) for [facility] on amion for assistance.

## 2020-06-03 NOTE — Progress Notes (Signed)
X-ray aware of gastrografin admin at 1313. Will continue to monitor.

## 2020-06-04 LAB — CBC
HCT: 37.5 % (ref 36.0–46.0)
Hemoglobin: 12.7 g/dL (ref 12.0–15.0)
MCH: 31.4 pg (ref 26.0–34.0)
MCHC: 33.9 g/dL (ref 30.0–36.0)
MCV: 92.6 fL (ref 80.0–100.0)
Platelets: 173 10*3/uL (ref 150–400)
RBC: 4.05 MIL/uL (ref 3.87–5.11)
RDW: 11.9 % (ref 11.5–15.5)
WBC: 5.9 10*3/uL (ref 4.0–10.5)
nRBC: 0 % (ref 0.0–0.2)

## 2020-06-04 LAB — BASIC METABOLIC PANEL
Anion gap: 7 (ref 5–15)
BUN: 13 mg/dL (ref 8–23)
CO2: 33 mmol/L — ABNORMAL HIGH (ref 22–32)
Calcium: 9.1 mg/dL (ref 8.9–10.3)
Chloride: 94 mmol/L — ABNORMAL LOW (ref 98–111)
Creatinine, Ser: 0.87 mg/dL (ref 0.44–1.00)
GFR, Estimated: >60 mL/min (ref >60)
Glucose, Bld: 115 mg/dL — ABNORMAL HIGH (ref 70–99)
Potassium: 3.7 mmol/L (ref 3.5–5.1)
Sodium: 134 mmol/L — ABNORMAL LOW (ref 135–145)

## 2020-06-04 LAB — MAGNESIUM: Magnesium: 1.6 mg/dL — ABNORMAL LOW (ref 1.7–2.4)

## 2020-06-04 MED ORDER — MAGNESIUM SULFATE 2 GM/50ML IV SOLN
2.0000 g | Freq: Once | INTRAVENOUS | Status: AC
  Administered 2020-06-04: 2 g via INTRAVENOUS
  Filled 2020-06-04: qty 50

## 2020-06-04 MED ORDER — PHENOL 1.4 % MT LIQD
1.0000 | OROMUCOSAL | Status: DC | PRN
  Administered 2020-06-04: 1 via OROMUCOSAL
  Filled 2020-06-04: qty 177

## 2020-06-04 NOTE — Progress Notes (Signed)
[facility] Progress Note  Patient: Felicia Sawyer    GGY:[id number]  DOA: 06/02/2020     Date of Service: the patient was seen and examined on 06/04/2020  Brief hospital course: Past medical history of HTN, HLD, anxiety, depression, history of cholecystectomy and inguinal hernia repair chronic umbilical hernia.  Presents with complaints of abdominal pain.  Found to have SBO. Currently plan is conservative measures for SBO.  Follow-up surgery recommendation.  Assessment and Plan: 1.  Small bowel obstruction CT abdomen with contrast shows SBO with transition point within the left lower midabdomen. NG tube inserted. Currently on IV fluids. General surgery consulted. N.p.o.  Small bowel protocol not attempted due to persistent contrast in stomach. No change in plan for surgery today.  2.  Essential hypertension with hypertensive urgency Wide pulse pressure 112 secondary to isolated systolic hypertension Blood pressure elevated.,  Pulse pressure improved. On clonidine 0.1 mg twice daily at home. Currently on clonidine patch 0.1. Also on Lasix and losartan which is currently on hold. As needed hydralazine. Monitor for improvement in blood pressure.  Currently no indication for further work-up as no symptoms but will benefit from echocardiogram outpatient  3.  Bilateral pleural effusion with bibasilar atelectasis Seen incidentally on CT scan. Incentive spirometry.  Currently not hypoxic.  No evidence of volume overload.  4.  Acute kidney injury  Baseline serum creatinine 0.8.  On presentation serum creatinine 1.2.  Evidence of hemoconcentration. Currently continue IV fluid.  5.  Hypokalemia Mild hyponatremia.  No significant Hypomagnesemia. Potassium and magnesium currently being replaced. Sodium minimally low likely in the setting of poor p.o. intake. Continue to replace with IV fluids.  6. 1.1 cm left renal hypodensity Seen incidentally. MRI renal protocol  recommended.  7. Anxiety and depression  Continue clorazepate per home dose per patient desire. Continue as needed Ativan IV.  8. Hyperlipidemia Holding simvastatin.  9. Uterine fibroids Incidentally seen on the CT scan. Outpatient follow-up with GYN.  10. Umbilical hernia Without any ischemia or obstruction  11.  Patient is on aspirin at home. Currently I do not see any recent history of CAD or CVA. In the setting of SBO holding it.  Diet: N.p.o. DVT Prophylaxis:   enoxaparin (LOVENOX) injection 40 mg Start: 06/02/20 1500    Advance goals of care discussion: Full code  Family Communication: family was present at bedside, at the time of interview. The pt provided permission to discuss medical plan with the family. Opportunity was given to ask question and all questions were answered satisfactorily.   Disposition:  Status is: Inpatient  Remains inpatient appropriate because:IV treatments appropriate due to intensity of illness or inability to take PO   Dispo: The patient is from: Home              Anticipated d/c is to: Home              Patient currently is not medically stable to d/c.   Difficult to place patient No  Subjective: No nausea no vomiting but no fever no chills.  Not passing gas.  No BM.  Physical Exam:  General: Appear in mild distress, no Rash; Oral Mucosa Clear, moist. no Abnormal Neck Mass Or lumps, Conjunctiva normal  Cardiovascular: S1 and S2 Present, no Murmur, Respiratory: good respiratory effort, Bilateral Air entry present and CTA, no Crackles, no wheezes Abdomen: Bowel Sound absent, Soft and no tenderness Extremities: no Pedal edema Neurology: alert and oriented to time, place, and person affect appropriate. no  new focal deficit Gait not checked due to patient safety concerns  Vitals:   06/03/20 1347 06/03/20 2120 06/04/20 0754 06/04/20 1458  BP:  (!) 181/60 (!) 167/68 (!) 143/59  Pulse:  83 73 77  Resp:  18 18 18   Temp:  99.1 F (37.3  C) 99.2 F (37.3 C) 99 F (37.2 C)  TempSrc:  Oral Oral Oral  SpO2: 92% 99% 99% 97%    Intake/Output Summary (Last 24 hours) at 06/04/2020 1901 Last data filed at 06/04/2020 1839 Gross per 24 hour  Intake 2268.44 ml  Output 900 ml  Net 1368.44 ml   There were no vitals filed for this visit.  Data Reviewed: I have personally reviewed and interpreted daily labs, tele strips, imaging. I reviewed all nursing notes, pharmacy notes, vitals, pertinent old records I have discussed plan of care as described above with RN and patient/family.  CBC: Recent Labs  Lab 06/02/20 1116 06/04/20 0450  WBC 8.6 5.9  NEUTROABS 6.6  --   HGB 14.0 12.7  HCT 40.4 37.5  MCV 91.2 92.6  PLT 211 017   Basic Metabolic Panel: Recent Labs  Lab 06/02/20 1116 06/03/20 0100 06/04/20 0450  NA 128* 130* 134*  K 3.4* 3.4* 3.7  CL 80* 89* 94*  CO2 35* 33* 33*  GLUCOSE 130* 106* 115*  BUN 19 19 13   CREATININE 1.20* 1.00 0.87  CALCIUM 10.4* 9.4 9.1  MG 1.7  --  1.6*  PHOS 5.0*  --   --     Studies: DG Abd Portable 1V-Small Bowel Obstruction Protocol-initial, 8 hr delay  Result Date: 06/03/2020 CLINICAL DATA:  Small-bowel obstruction 8 hour delay EXAM: PORTABLE ABDOMEN - 1 VIEW COMPARISON:  06/03/2020, CT 06/02/2020 FINDINGS: Esophageal tube tip and side port overlie proximal stomach. There is dense contrast in the stomach. Dilated small bowel measuring up to 3.6 cm. Scattered colon gas and stool. No definite contrast within the colon or rectum. Calcified uterine fibroids. Contrast within the urinary bladder. IMPRESSION: Dilated small bowel with gas and stool present in the colon. Overall decreased dilated small bowel in right lower quadrant. There is dense contrast within the stomach but no definitive contrast within the colon or rectum. Calcified uterine fibroids Electronically Signed   By: Donavan Foil M.D.   On: 06/03/2020 21:45    Scheduled Meds: . cloNIDine  0.1 mg Transdermal Weekly  .  clorazepate  7.5 mg Oral BID  . enoxaparin (LOVENOX) injection  40 mg Subcutaneous Q24H   Continuous Infusions: . dextrose 5 % and 0.45 % NaCl with KCl 20 mEq/L 100 mL/hr at 06/04/20 1839   PRN Meds: hydrALAZINE, HYDROmorphone (DILAUDID) injection, LORazepam, ondansetron (ZOFRAN) IV, phenol  Time spent: 35 minutes  Author: Berle Mull, MD [facility] 06/04/2020 7:01 PM  To reach On-call, see care teams to locate the attending and reach out via [email or web address]. Between 7PM-7AM, please contact night-coverage If you still have difficulty reaching the attending provider, please page the [facility] (Director on Call) for [facility] on amion for assistance.

## 2020-06-04 NOTE — Progress Notes (Signed)
Assessment & Plan: HD#3 - small bowel obstruction likely secondary to adhesions             NG placed with large output             AXR in AM 4/10 - did not start protocol as large contrast in stomach from CT             NPO, IVF, ice chips             Encourage OOB, ambulation        Felicia Gemma, MD       [facility]       Office: [phone]   Chief Complaint: SBO  Subjective: Patient in bed, feels better after NG placement.  Denies flatus or BM.  Wants "coffee".  Objective: Vital signs in last 24 hours: Temp:  [98.7 F (37.1 C)-99.1 F (37.3 C)] 99.1 F (37.3 C) (04/09 2120) Pulse Rate:  [83-87] 83 (04/09 2120) Resp:  [16-18] 18 (04/09 2120) BP: (166-181)/(60-63) 181/60 (04/09 2120) SpO2:  [78 %-99 %] 99 % (04/09 2120) Last BM Date: 05/31/20  Intake/Output from previous day: 04/09 0701 - 04/10 0700 In: 980.5 [I.V.:950.5; NG/GT:30] Out: 2300 [Emesis/NG output:2300] Intake/Output this shift: No intake/output data recorded.  Physical Exam: HEENT - sclerae clear, mucous membranes moist Neck - soft Chest - clear bilaterally Cor - RRR Abdomen - soft, minimal distension; hernia soft and non-tender; NG bilious Ext - no edema, non-tender Neuro - alert & oriented, no focal deficits  Lab Results:  Recent Labs    06/02/20 1116 06/04/20 0450  WBC 8.6 5.9  HGB 14.0 12.7  HCT 40.4 37.5  PLT 211 173   BMET Recent Labs    06/03/20 0100 06/04/20 0450  NA 130* 134*  K 3.4* 3.7  CL 89* 94*  CO2 33* 33*  GLUCOSE 106* 115*  BUN 19 13  CREATININE 1.00 0.87  CALCIUM 9.4 9.1   PT/INR No results for input(s): LABPROT, INR in the last 72 hours. Comprehensive Metabolic Panel:    Component Value Date/Time   NA 134 (L) 06/04/2020 0450   NA 130 (L) 06/03/2020 0100   NA 141 01/23/2015 0837   NA 142 11/25/2013 0913   K 3.7 06/04/2020 0450   K 3.4 (L) 06/03/2020 0100   CL 94 (L) 06/04/2020 0450   CL 89 (L) 06/03/2020 0100   CO2 33 (H)  06/04/2020 0450   CO2 33 (H) 06/03/2020 0100   BUN 13 06/04/2020 0450   BUN 19 06/03/2020 0100   BUN 12 01/23/2015 0837   BUN 16 11/25/2013 0913   CREATININE 0.87 06/04/2020 0450   CREATININE 1.00 06/03/2020 0100   CREATININE 0.80 12/02/2019 1423   CREATININE 0.95 (H) 05/04/2018 0805   GLUCOSE 115 (H) 06/04/2020 0450   GLUCOSE 106 (H) 06/03/2020 0100   CALCIUM 9.1 06/04/2020 0450   CALCIUM 9.4 06/03/2020 0100   AST 25 06/02/2020 1116   AST 17 12/02/2019 1423   ALT 16 06/02/2020 1116   ALT 13 12/02/2019 1423   ALKPHOS 63 06/02/2020 1116   ALKPHOS 59 01/26/2016 0911   BILITOT 1.3 (H) 06/02/2020 1116   BILITOT 0.5 12/02/2019 1423   BILITOT 0.5 01/23/2015 0837   PROT 7.2 06/02/2020 1116   PROT 6.4 12/02/2019 1423   PROT 6.3 01/23/2015 0837   PROT 6.6 11/25/2013 0913   ALBUMIN 3.9 06/02/2020 1116   ALBUMIN 3.6 01/26/2016 0911   ALBUMIN 3.7 01/23/2015  9485   ALBUMIN 4.0 11/25/2013 0913    Studies/Results: CT ABDOMEN PELVIS W CONTRAST  Result Date: 06/02/2020 CLINICAL DATA:  Bowel obstruction suspected. EXAM: CT ABDOMEN AND PELVIS WITH CONTRAST TECHNIQUE: Multidetector CT imaging of the abdomen and pelvis was performed using the standard protocol following bolus administration of intravenous contrast. CONTRAST:  52mL OMNIPAQUE IOHEXOL 300 MG/ML  SOLN COMPARISON:  None. FINDINGS: Lower chest: Bilateral trace pleural effusions. Bilateral lower lobe passive atelectasis. Bibasilar atelectasis versus scarring. The esophagus is dilated with fluid. Hepatobiliary: No focal liver abnormality. Status post cholecystectomy with intra and extrahepatic biliary ductal dilatation which can be seen in the post cholecystectomy setting. Pancreas: No focal lesion. Normal pancreatic contour. No surrounding inflammatory changes. No main pancreatic ductal dilatation. Spleen: Normal in size without focal abnormality. Adrenals/Urinary Tract: No adrenal nodule bilaterally. Bilateral kidneys enhance symmetrically.  Fluid density lesions likely represent simple renal cysts. Subcentimeter hypodensities are too small to characterize. There is a 1.1 cm hypodensity within left kidney that demonstrates a density of 29 Hounsfield units. No hydronephrosis. No hydroureter. The urinary bladder is unremarkable. Stomach/Bowel: Fluid distension of the gastric lumen. Fluid distension of the proximal and mid small bowel up to 3.3 cm with a transition point in the left lower mid abdomen (6:27, 3:64). Small bowel distally is collapsed. No bowel thickening or pneumatosis. No large bowel dilatation or bowel wall thickening. Stool is noted within the ascending colon. Diffuse sigmoid diverticulosis. The appendix is not definitely identified. Vascular/Lymphatic: No abdominal aorta or iliac aneurysm. Severe atherosclerotic plaque of the aorta and its branches. No abdominal, pelvic, or inguinal lymphadenopathy. Reproductive: Multiple coarsely calcified lesions within the urine walls likely represent fibroids. Otherwise the uterus and bilateral adnexa are unremarkable. Other: No intraperitoneal free fluid. No intraperitoneal free gas. No organized fluid collection. Musculoskeletal: Small fat containing umbilical hernia with an abdominal defect of 2.2 cm. No suspicious lytic or blastic osseous lesions. No acute displaced fracture. Multilevel degenerative changes of the spine. Grade 1 anterolisthesis of L4 on L5. IMPRESSION: 1. Small-bowel obstruction with a transition point within the left lower mid abdomen. Recommend enteric tube placement given fluid dilatation of the visualized distal esophagus. 2. Bilateral trace pleural effusions. 3. Indeterminate 1.1 cm left renal hypodensity. Recommend MRI renal protocol for further evaluation. 4. Small fat containing umbilical hernia with an abdominal defect of 2.2 cm. No findings suggest ischemia or associated bowel obstruction. 5. Diffuse sigmoid diverticulosis with no acute diverticulitis. 6. Multiple  uterine fibroids. 7.  Aortic Atherosclerosis (ICD10-I70.0). These results will be called to the ordering clinician or representative by the Radiologist Assistant, and communication documented in the PACS or [employer]. Electronically Signed   By: Iven Finn M.D.   On: 06/02/2020 23:27   DG Abdomen Acute W/Chest  Result Date: 06/02/2020 CLINICAL DATA:  Abdominal pain, nausea, and vomiting. Abdominal hernia. EXAM: DG ABDOMEN ACUTE WITH 1 VIEW CHEST COMPARISON:  Chest radiographs 04/02/2007 FINDINGS: The cardiomediastinal silhouette is unchanged with normal heart size. Aortic atherosclerosis is noted. Lung volumes are low with linear opacities in the right greater than left lung bases. There may be small bilateral pleural effusions. No edema or pneumothorax is evident. No intraperitoneal free air is identified. There are multiple dilated loops of small bowel in the right lower quadrant measuring up to approximately 4 cm in diameter with air-fluid levels. A small amount of gas and stool are present in nondilated colon. Coarse calcifications in the pelvis likely reflect fibroids. There is mild upper lumbar levoscoliosis. IMPRESSION: 1.  Dilated small bowel loops in the right lower quadrant consistent with obstruction. 2. Low lung volumes with bibasilar atelectasis and possible small pleural effusions. Electronically Signed   By: Logan Bores M.D.   On: 06/02/2020 13:39   DG Abd Portable 1V-Small Bowel Obstruction Protocol-initial, 8 hr delay  Result Date: 06/03/2020 CLINICAL DATA:  Small-bowel obstruction 8 hour delay EXAM: PORTABLE ABDOMEN - 1 VIEW COMPARISON:  06/03/2020, CT 06/02/2020 FINDINGS: Esophageal tube tip and side port overlie proximal stomach. There is dense contrast in the stomach. Dilated small bowel measuring up to 3.6 cm. Scattered colon gas and stool. No definite contrast within the colon or rectum. Calcified uterine fibroids. Contrast within the urinary bladder. IMPRESSION: Dilated  small bowel with gas and stool present in the colon. Overall decreased dilated small bowel in right lower quadrant. There is dense contrast within the stomach but no definitive contrast within the colon or rectum. Calcified uterine fibroids Electronically Signed   By: Donavan Foil M.D.   On: 06/03/2020 21:45   DG Abd Portable 1V  Result Date: 06/03/2020 CLINICAL DATA:  Follow-up small bowel obstruction. EXAM: PORTABLE ABDOMEN - 1 VIEW COMPARISON:  CT abdomen and pelvis and acute abdomen series yesterday. FINDINGS: Nasogastric tube tip projects over the fundus of the stomach. Persistent moderate distension of multiple loops of small bowel throughout the abdomen, slightly improved since yesterday. Gas and stool throughout normal caliber colon. No suggestion of free air on the supine image. Numerous calcified uterine fibroids again noted. IMPRESSION: 1. Slight improvement in the partial small bowel obstruction since yesterday. No suggestion of free intraperitoneal air on the supine image. 2. Nasogastric tube tip projects over the fundus of the stomach. Electronically Signed   By: Evangeline Dakin M.D.   On: 06/03/2020 10:47      Felicia Sawyer 06/04/2020  Patient ID: Hayden Rasmussen, female   DOB: March 30, 1940, 80 y.o.   MRN: [id number]

## 2020-06-04 NOTE — Plan of Care (Signed)

## 2020-06-05 ENCOUNTER — Inpatient Hospital Stay (HOSPITAL_COMMUNITY): Payer: Medicare HMO

## 2020-06-05 ENCOUNTER — Ambulatory Visit: Payer: Medicare HMO | Admitting: Nurse Practitioner

## 2020-06-05 NOTE — Progress Notes (Signed)
[facility] Progress Note  Patient: Felicia Sawyer    NTZ:[id number]  DOA: 06/02/2020     Date of Service: the patient was seen and examined on 06/05/2020  Brief hospital course: Past medical history of HTN, HLD, anxiety, depression, history of cholecystectomy and inguinal hernia repair chronic umbilical hernia. Presents with complaints of abdominal pain.  Found to have SBO. Currently plan is Dr. Jeannine Kitten treatment of SBO.  Assessment and Plan: 1.  Small bowel obstruction CT abdomen with contrast shows SBO with transition point within the left lower midabdomen. NG tube inserted. Currently on IV fluids. General surgery consulted. N.p.o.  Small bowel protocol not attempted due to persistent contrast in stomach. No change in plan for surgery today.  2.  Essential hypertension with hypertensive urgency Wide pulse pressure 112 secondary to isolated systolic hypertension Blood pressure elevated.,  Pulse pressure improved. On clonidine 0.1 mg twice daily at home. Currently on clonidine patch 0.1. Also on Lasix and losartan which is currently on hold. As needed hydralazine. Monitor for improvement in blood pressure.  Currently no indication for further work-up as no symptoms but will benefit from echocardiogram outpatient  3.  Bilateral pleural effusion with bibasilar atelectasis Seen incidentally on CT scan. Incentive spirometry.  Currently not hypoxic.  No evidence of volume overload.  4.  Acute kidney injury  Baseline serum creatinine 0.8.  On presentation serum creatinine 1.2.  Evidence of hemoconcentration. Currently continue IV fluid.  5.  Hypokalemia Mild hyponatremia.  No significant Hypomagnesemia. Potassium and magnesium currently being replaced. Sodium minimally low likely in the setting of poor p.o. intake. Continue to replace with IV fluids.  6. 1.1 cm left renal hypodensity Seen incidentally. MRI renal protocol recommended.  7. Anxiety and  depression  Continue clorazepate per home dose per patient desire. Continue as needed Ativan IV.  8. Hyperlipidemia Holding simvastatin.  9. Uterine fibroids Incidentally seen on the CT scan. Outpatient follow-up with GYN.  10. Umbilical hernia Without any ischemia or obstruction  11.  Patient is on aspirin at home. Currently I do not see any recent history of CAD or CVA. In the setting of SBO holding it.  Diet: N.p.o. DVT Prophylaxis:   enoxaparin (LOVENOX) injection 40 mg Start: 06/02/20 1500    Advance goals of care discussion: Full code  Family Communication: no family was present at bedside, at the time of interview.   Disposition:  Status is: Inpatient  Remains inpatient appropriate because:IV treatments appropriate due to intensity of illness or inability to take PO   Dispo: The patient is from: Home              Anticipated d/c is to: Home              Patient currently is not medically stable to d/c.   Difficult to place patient No  Subjective: No nausea no vomiting.  No fever no chills or no chest pain.  Abdominal pain.  Still no BM.  Still not passing gas.  Physical Exam:  General: Appear in mild distress, no Rash; Oral Mucosa Clear, moist. no Abnormal Neck Mass Or lumps, Conjunctiva normal  Cardiovascular: S1 and S2 Present, no Murmur, Respiratory: good respiratory effort, Bilateral Air entry present and CTA, no Crackles, no wheezes Abdomen: Bowel Sound sluggish, distended and tense and no tenderness Extremities: no Pedal edema Neurology: alert and oriented to time, place, and person affect appropriate. no new focal deficit Gait not checked due to patient safety concerns    Vitals:  06/04/20 1458 06/04/20 2024 06/05/20 0410 06/05/20 1350  BP: (!) 143/59 (!) 154/65 (!) 149/56 (!) 158/62  Pulse: 77 72 75 70  Resp: 18 17 19 16   Temp: 99 F (37.2 C) 99.5 F (37.5 C) 99.3 F (37.4 C) 99.3 F (37.4 C)  TempSrc: Oral Oral Oral Oral  SpO2: 97%  98% 98% 97%    Intake/Output Summary (Last 24 hours) at 06/05/2020 1831 Last data filed at 06/05/2020 0425 Gross per 24 hour  Intake 3364.19 ml  Output 775 ml  Net 2589.19 ml   There were no vitals filed for this visit.  Data Reviewed: I have personally reviewed and interpreted daily labs, tele strips, imaging. I reviewed all nursing notes, pharmacy notes, vitals, pertinent old records I have discussed plan of care as described above with RN and patient/family.  CBC: Recent Labs  Lab 06/02/20 1116 06/04/20 0450  WBC 8.6 5.9  NEUTROABS 6.6  --   HGB 14.0 12.7  HCT 40.4 37.5  MCV 91.2 92.6  PLT 211 622   Basic Metabolic Panel: Recent Labs  Lab 06/02/20 1116 06/03/20 0100 06/04/20 0450  NA 128* 130* 134*  K 3.4* 3.4* 3.7  CL 80* 89* 94*  CO2 35* 33* 33*  GLUCOSE 130* 106* 115*  BUN 19 19 13   CREATININE 1.20* 1.00 0.87  CALCIUM 10.4* 9.4 9.1  MG 1.7  --  1.6*  PHOS 5.0*  --   --     Studies: DG Abd Portable 1V  Result Date: 06/05/2020 CLINICAL DATA:  Small bowel obstruction EXAM: PORTABLE ABDOMEN - 1 VIEW COMPARISON:  June 03, 2020 FINDINGS: Nasogastric tube tip and side port are in the stomach. There is contrast in the colon. Loops of mildly dilated small bowel remain without air-fluid levels. No evident free air. Multiple calcifications in the pelvis are likely due to uterine leiomyomas. IMPRESSION: Nasogastric tube tip and side port in stomach. Loops of mildly dilated small bowel remain, with contrast in the colon. Question resolving bowel obstruction. There may be a degree of residual ileus. Apparent calcified uterine leiomyomas in the pelvis. Electronically Signed   By: Lowella Grip III M.D.   On: 06/05/2020 07:52    Scheduled Meds: . cloNIDine  0.1 mg Transdermal Weekly  . clorazepate  7.5 mg Oral BID  . enoxaparin (LOVENOX) injection  40 mg Subcutaneous Q24H   Continuous Infusions: . dextrose 5 % and 0.45 % NaCl with KCl 20 mEq/L 100 mL/hr at 06/05/20  1230   PRN Meds: hydrALAZINE, HYDROmorphone (DILAUDID) injection, LORazepam, ondansetron (ZOFRAN) IV, phenol  Time spent: 35 minutes  Author: Berle Mull, MD [facility] 06/05/2020 6:31 PM  To reach On-call, see care teams to locate the attending and reach out via [email or web address]. Between 7PM-7AM, please contact night-coverage If you still have difficulty reaching the attending provider, please page the [facility] (Director on Call) for [facility] on amion for assistance.

## 2020-06-05 NOTE — Progress Notes (Signed)
Subjective: CC: Patient reports that her abdominal pain has resolved.  She is unsure when this resolved but reports no pain last night or this morning. She denies nausea.  Still no flatus or BM.  She mobilized in the halls yesterday.  NG tube in place on LIWS with scant output in canister.   Objective: Vital signs in last 24 hours: Temp:  [99 F (37.2 C)-99.5 F (37.5 C)] 99.3 F (37.4 C) (04/11 0410) Pulse Rate:  [72-77] 75 (04/11 0410) Resp:  [17-19] 19 (04/11 0410) BP: (143-154)/(56-65) 149/56 (04/11 0410) SpO2:  [97 %-98 %] 98 % (04/11 0410) Last BM Date: 05/31/20  Intake/Output from previous day: 04/10 0701 - 04/11 0700 In: 3364.2 [P.O.:150; I.V.:3214.2] Out: 1175 [Emesis/NG [id number]] Intake/Output this shift: No intake/output data recorded.  PE: Gen:  Alert, NAD, pleasant Card:  Reg Pulm:  Normal rate and effort  Abd: Soft, mild distension, NT, slightly hypoactive bowel sounds. Hernia soft, NT and reducible when patient lies flat. Prior surgical scars are well healed.  Ext:  No LE edmea  Psych: A&Ox3  Skin: no rashes noted, warm and dry  Lab Results:  Recent Labs    06/02/20 1116 06/04/20 0450  WBC 8.6 5.9  HGB 14.0 12.7  HCT 40.4 37.5  PLT 211 173   BMET Recent Labs    06/03/20 0100 06/04/20 0450  NA 130* 134*  K 3.4* 3.7  CL 89* 94*  CO2 33* 33*  GLUCOSE 106* 115*  BUN 19 13  CREATININE 1.00 0.87  CALCIUM 9.4 9.1   PT/INR No results for input(s): LABPROT, INR in the last 72 hours. CMP     Component Value Date/Time   NA 134 (L) 06/04/2020 0450   NA 141 01/23/2015 0837   K 3.7 06/04/2020 0450   CL 94 (L) 06/04/2020 0450   CO2 33 (H) 06/04/2020 0450   GLUCOSE 115 (H) 06/04/2020 0450   BUN 13 06/04/2020 0450   BUN 12 01/23/2015 0837   CREATININE 0.87 06/04/2020 0450   CREATININE 0.80 12/02/2019 1423   CALCIUM 9.1 06/04/2020 0450   PROT 7.2 06/02/2020 1116   PROT 6.3 01/23/2015 0837   ALBUMIN 3.9 06/02/2020 1116   ALBUMIN 3.7  01/23/2015 0837   AST 25 06/02/2020 1116   ALT 16 06/02/2020 1116   ALKPHOS 63 06/02/2020 1116   BILITOT 1.3 (H) 06/02/2020 1116   BILITOT 0.5 01/23/2015 0837   GFRNONAA >60 06/04/2020 0450   GFRNONAA 71 12/02/2019 1423   GFRAA 82 12/02/2019 1423   Lipase     Component Value Date/Time   LIPASE 42 06/02/2020 1116       Studies/Results: DG Abd Portable 1V  Result Date: 06/05/2020 CLINICAL DATA:  Small bowel obstruction EXAM: PORTABLE ABDOMEN - 1 VIEW COMPARISON:  June 03, 2020 FINDINGS: Nasogastric tube tip and side port are in the stomach. There is contrast in the colon. Loops of mildly dilated small bowel remain without air-fluid levels. No evident free air. Multiple calcifications in the pelvis are likely due to uterine leiomyomas. IMPRESSION: Nasogastric tube tip and side port in stomach. Loops of mildly dilated small bowel remain, with contrast in the colon. Question resolving bowel obstruction. There may be a degree of residual ileus. Apparent calcified uterine leiomyomas in the pelvis. Electronically Signed   By: Lowella Grip III M.D.   On: 06/05/2020 07:52   DG Abd Portable 1V-Small Bowel Obstruction Protocol-initial, 8 hr delay  Result Date: 06/03/2020 CLINICAL DATA:  Small-bowel obstruction 8 hour delay EXAM: PORTABLE ABDOMEN - 1 VIEW COMPARISON:  06/03/2020, CT 06/02/2020 FINDINGS: Esophageal tube tip and side port overlie proximal stomach. There is dense contrast in the stomach. Dilated small bowel measuring up to 3.6 cm. Scattered colon gas and stool. No definite contrast within the colon or rectum. Calcified uterine fibroids. Contrast within the urinary bladder. IMPRESSION: Dilated small bowel with gas and stool present in the colon. Overall decreased dilated small bowel in right lower quadrant. There is dense contrast within the stomach but no definitive contrast within the colon or rectum. Calcified uterine fibroids Electronically Signed   By: Donavan Foil M.D.   On:  06/03/2020 21:45   DG Abd Portable 1V  Result Date: 06/03/2020 CLINICAL DATA:  Follow-up small bowel obstruction. EXAM: PORTABLE ABDOMEN - 1 VIEW COMPARISON:  CT abdomen and pelvis and acute abdomen series yesterday. FINDINGS: Nasogastric tube tip projects over the fundus of the stomach. Persistent moderate distension of multiple loops of small bowel throughout the abdomen, slightly improved since yesterday. Gas and stool throughout normal caliber colon. No suggestion of free air on the supine image. Numerous calcified uterine fibroids again noted. IMPRESSION: 1. Slight improvement in the partial small bowel obstruction since yesterday. No suggestion of free intraperitoneal air on the supine image. 2. Nasogastric tube tip projects over the fundus of the stomach. Electronically Signed   By: Evangeline Dakin M.D.   On: 06/03/2020 10:47    Anti-infectives: Anti-infectives (From admission, onward)   None       Assessment/Plan HTN HLD Diverticulosis Depression PVD Tobacco use disorder Ventral Hernia - CT 4/8 w/ Small fat containing hernia with an abdominal defect of 2.2 cm. No findings suggest ischemia or associated bowel obstruction. Reducible on exam. NT and without skin changes.   Small bowel obstruction  - CT 4/8 w small-bowel obstruction with a transition point within the left lower mid abdomen. - Abdominal surgical history: cholecystectomy, tubal ligation, TAH with vesicovaginal fistula closure, right inguinal hernia repair. Suspect SBO 2/2 adhesions - SBO protocol not done as patient still with large amount of contrast in her stomach from CT on xray 4/9 - Xray this am with mildly dilated small bowel remain but contrast in the colon. Patients abdominal pain has resolved. She is soft, with mild distension and NT on exam. She denies flatus or bm. Had 1.175L out from NGT in the last 24 hours (only has taken in small sips of ice). It appears she is clinically and radiographically heading in  the right direction but with NGT output > 1L and still no flatus/BM will leave on LIWS this am. Recheck this PM to determine if can start clamping trials.  - Keep K > 4 and Mg >2 for bowel function. No labs to review this am - Encourage OOB, ambulation  FEN - NPO, IVF, NGT to LIWS VTE - SCDs, Loveonx ID - None   LOS: 3 days    Jillyn Ledger , [facility] 06/05/2020, 8:22 AM Please see Amion for pager number during day hours 7:00am-4:30pm

## 2020-06-05 NOTE — Plan of Care (Signed)
  Problem: Education: Goal: Knowledge of General Education information will improve Description: Including pain rating scale, medication(s)/side effects and non-pharmacologic comfort measures Outcome: Progressing   Problem: Clinical Measurements: Goal: Ability to maintain clinical measurements within normal limits will improve Outcome: Progressing Goal: Will remain free from infection Outcome: Progressing   

## 2020-06-05 NOTE — Plan of Care (Signed)
  Problem: Education: Goal: Knowledge of General Education information will improve Description: Including pain rating scale, medication(s)/side effects and non-pharmacologic comfort measures Outcome: Progressing   Problem: Health Behavior/Discharge Planning: Goal: Ability to manage health-related needs will improve Outcome: Progressing   Problem: Clinical Measurements: Goal: Ability to maintain clinical measurements within normal limits will improve Outcome: Progressing   Problem: Activity: Goal: Risk for activity intolerance will decrease Outcome: Progressing   Problem: Elimination: Goal: Will not experience complications related to bowel motility Outcome: Progressing Goal: Will not experience complications related to urinary retention Outcome: Progressing   Problem: Pain Managment: Goal: General experience of comfort will improve Outcome: Progressing   Problem: Safety: Goal: Ability to remain free from injury will improve Outcome: Progressing   Problem: Skin Integrity: Goal: Risk for impaired skin integrity will decrease Outcome: Progressing   

## 2020-06-06 LAB — MAGNESIUM: Magnesium: 1.8 mg/dL (ref 1.7–2.4)

## 2020-06-06 LAB — CBC
HCT: 33.7 % — ABNORMAL LOW (ref 36.0–46.0)
Hemoglobin: 11.2 g/dL — ABNORMAL LOW (ref 12.0–15.0)
MCH: 32 pg (ref 26.0–34.0)
MCHC: 33.2 g/dL (ref 30.0–36.0)
MCV: 96.3 fL (ref 80.0–100.0)
Platelets: 174 10*3/uL (ref 150–400)
RBC: 3.5 MIL/uL — ABNORMAL LOW (ref 3.87–5.11)
RDW: 11.9 % (ref 11.5–15.5)
WBC: 5.6 10*3/uL (ref 4.0–10.5)
nRBC: 0 % (ref 0.0–0.2)

## 2020-06-06 LAB — BASIC METABOLIC PANEL
Anion gap: 4 — ABNORMAL LOW (ref 5–15)
BUN: 5 mg/dL — ABNORMAL LOW (ref 8–23)
CO2: 34 mmol/L — ABNORMAL HIGH (ref 22–32)
Calcium: 8.2 mg/dL — ABNORMAL LOW (ref 8.9–10.3)
Chloride: 99 mmol/L (ref 98–111)
Creatinine, Ser: 0.75 mg/dL (ref 0.44–1.00)
GFR, Estimated: >60 mL/min (ref >60)
Glucose, Bld: 117 mg/dL — ABNORMAL HIGH (ref 70–99)
Potassium: 3.7 mmol/L (ref 3.5–5.1)
Sodium: 137 mmol/L (ref 135–145)

## 2020-06-06 NOTE — Care Management Important Message (Signed)
Important Message  Patient Details  Name: Felicia Sawyer MRN: [id number] Date of Birth: Jul 14, 1940   Medicare Important Message Given:  Yes     Orbie Pyo 06/06/2020, 3:24 PM

## 2020-06-06 NOTE — Progress Notes (Signed)
Subjective: CC: Doing well. She reports that yesterday she began passing flatus and had a small hard BM. She has passed flatus x 2 this morning already. She denies any abdominal pain, distension or nausea. NGT output down to 550cc/24 hours.   Objective: Vital signs in last 24 hours: Temp:  [98.9 F (37.2 C)-99.3 F (37.4 C)] 98.9 F (37.2 C) (04/12 0432) Pulse Rate:  [70-82] 82 (04/12 0432) Resp:  [16-19] 18 (04/12 0432) BP: (154-162)/(59-62) 154/59 (04/12 0432) SpO2:  [94 %-100 %] 94 % (04/12 0432) Last BM Date: 06/05/20 (small per pt)  Intake/Output from previous day: 04/11 0701 - 04/12 0700 In: 2303.3 [I.V.:2303.3] Out: 550 [Emesis/NG output:550] Intake/Output this shift: No intake/output data recorded.  PE: Gen:  Alert, NAD, pleasant Card:  Reg Pulm:  Normal rate and effort  Abd: Soft, ND, NT, normoactive bowel sounds. Hernia soft, NT and reducible when patient lies flat. Prior surgical scars are well healed.  Psych: A&Ox3  Skin: no rashes noted, warm and dry  Lab Results:  Recent Labs    06/04/20 0450 06/06/20 0639  WBC 5.9 5.6  HGB 12.7 11.2*  HCT 37.5 33.7*  PLT 173 174   BMET Recent Labs    06/04/20 0450 06/06/20 0639  NA 134* 137  K 3.7 3.7  CL 94* 99  CO2 33* 34*  GLUCOSE 115* 117*  BUN 13 5*  CREATININE 0.87 0.75  CALCIUM 9.1 8.2*   PT/INR No results for input(s): LABPROT, INR in the last 72 hours. CMP     Component Value Date/Time   NA 137 06/06/2020 0639   NA 141 01/23/2015 0837   K 3.7 06/06/2020 0639   CL 99 06/06/2020 0639   CO2 34 (H) 06/06/2020 0639   GLUCOSE 117 (H) 06/06/2020 0639   BUN 5 (L) 06/06/2020 0639   BUN 12 01/23/2015 0837   CREATININE 0.75 06/06/2020 0639   CREATININE 0.80 12/02/2019 1423   CALCIUM 8.2 (L) 06/06/2020 0639   PROT 7.2 06/02/2020 1116   PROT 6.3 01/23/2015 0837   ALBUMIN 3.9 06/02/2020 1116   ALBUMIN 3.7 01/23/2015 0837   AST 25 06/02/2020 1116   ALT 16 06/02/2020 1116   ALKPHOS 63  06/02/2020 1116   BILITOT 1.3 (H) 06/02/2020 1116   BILITOT 0.5 01/23/2015 0837   GFRNONAA >60 06/06/2020 0639   GFRNONAA 71 12/02/2019 1423   GFRAA 82 12/02/2019 1423   Lipase     Component Value Date/Time   LIPASE 42 06/02/2020 1116       Studies/Results: DG Abd Portable 1V  Result Date: 06/05/2020 CLINICAL DATA:  Small bowel obstruction EXAM: PORTABLE ABDOMEN - 1 VIEW COMPARISON:  June 03, 2020 FINDINGS: Nasogastric tube tip and side port are in the stomach. There is contrast in the colon. Loops of mildly dilated small bowel remain without air-fluid levels. No evident free air. Multiple calcifications in the pelvis are likely due to uterine leiomyomas. IMPRESSION: Nasogastric tube tip and side port in stomach. Loops of mildly dilated small bowel remain, with contrast in the colon. Question resolving bowel obstruction. There may be a degree of residual ileus. Apparent calcified uterine leiomyomas in the pelvis. Electronically Signed   By: Lowella Grip III M.D.   On: 06/05/2020 07:52    Anti-infectives: Anti-infectives (From admission, onward)   None       Assessment/Plan HTN HLD Diverticulosis Depression PVD Tobacco use disorder Ventral Hernia - CT 4/8 w/ Small fat containing hernia with an  abdominal defect of 2.2 cm. No findings suggest ischemia or associated bowel obstruction. Reducible on exam. NT and without skin changes.   Small bowel obstruction  - CT 4/8 w small-bowel obstruction with a transition point within the left lower mid abdomen. - Abdominal surgical history: cholecystectomy, tubal ligation, TAH with vesicovaginal fistula closure, right inguinal hernia repair.Suspect SBO 2/2 adhesions - Xray 4/11 with mildly dilated small bowel remain but contrast in the colon. Patients abdominal pain has resolved. She is soft, ND and NT on exam. She is passing flatus and had a small BM yesterday. NGT output down to 550cc/24 hours. Plan to d/c NGT and give cld  today. - Encourage OOB, ambulation  FEN - D/c NGT. CLD, IVF per TRH. Mg 1.8. K 3.7 VTE - SCDs, Loveonx ID - None   LOS: 4 days    Jillyn Ledger , [facility] Surgery 06/06/2020, 8:37 AM Please see Amion for pager number during day hours 7:00am-4:30pm

## 2020-06-06 NOTE — Progress Notes (Signed)
[facility] Progress Note  Patient: Felicia Sawyer    ZOX:[id number]  DOA: 06/02/2020     Date of Service: the patient was seen and examined on 06/06/2020  Brief hospital course: Past medical history of HTN, HLD, anxiety, depression, history of cholecystectomy and inguinal hernia repair chronic umbilical hernia. Presents with complaints of abdominal pain.  Found to have SBO. Currently plan is Dr. Jeannine Kitten treatment of SBO.  Assessment and Plan: 1.  Small bowel obstruction CT abdomen with contrast shows SBO with transition point within the left lower midabdomen. NG tube inserted. Currently on IV fluids. General surgery consulted. NG tube removed on 4/12.  Started on clear liquid diet.  2.  Essential hypertension with hypertensive urgency Wide pulse pressure 112 secondary to isolated systolic hypertension Blood pressure elevated.,  Pulse pressure improved. On clonidine 0.1 mg twice daily at home. Currently on clonidine patch 0.1. Also on Lasix and losartan which is currently on hold. As needed hydralazine. Monitor for improvement in blood pressure.  Currently no indication for further work-up as no symptoms but will benefit from echocardiogram outpatient  3.  Bilateral pleural effusion with bibasilar atelectasis Seen incidentally on CT scan. Incentive spirometry.  Currently not hypoxic.  No evidence of volume overload.  4.  Acute kidney injury  Baseline serum creatinine 0.8.  On presentation serum creatinine 1.2.  Evidence of hemoconcentration. Currently continue IV fluid.  5.  Hypokalemia Mild hyponatremia.  No significant Hypomagnesemia. Potassium and magnesium currently being replaced. Sodium minimally low likely in the setting of poor p.o. intake. Continue to replace with IV fluids.  6. 1.1 cm left renal hypodensity Seen incidentally. MRI renal protocol recommended.  7. Anxiety and depression  Continue clorazepate per home dose per patient  desire. Continue as needed Ativan IV.  8. Hyperlipidemia Holding simvastatin.  9. Uterine fibroids Incidentally seen on the CT scan. Outpatient follow-up with GYN.  10. Umbilical hernia Without any ischemia or obstruction  11.  Patient is on aspirin at home. Currently I do not see any recent history of CAD or CVA. In the setting of SBO holding it.  Diet: Clear liquid diet DVT Prophylaxis:   Place TED hose Start: 06/06/20 1025 enoxaparin (LOVENOX) injection 40 mg Start: 06/02/20 1500  Advance goals of care discussion: Full code  Family Communication: no family was present at bedside, at the time of interview.   Disposition:  Status is: Inpatient  Remains inpatient appropriate because:IV treatments appropriate due to intensity of illness or inability to take PO   Dispo: The patient is from: Home              Anticipated d/c is to: Home              Patient currently is not medically stable to d/c.   Difficult to place patient No  Subjective: No nausea no vomiting but no fever no chills.  No chest pain.  No abdominal pain.  Physical Exam:  General: Appear in mild distress, no Rash; Oral Mucosa Clear, moist. no Abnormal Neck Mass Or lumps, Conjunctiva normal  Cardiovascular: S1 and S2 Present, no Murmur, Respiratory: good respiratory effort, Bilateral Air entry present and CTA, no Crackles, no wheezes Abdomen: Bowel Sound present, Soft and no tenderness Extremities: no Pedal edema Neurology: alert and oriented to time, place, and person affect appropriate. no new focal deficit Gait not checked due to patient safety concerns  Vitals:   06/05/20 1350 06/05/20 2028 06/06/20 0432 06/06/20 1253  BP: (!) 158/62 Marland Kitchen)  162/60 (!) 154/59 (!) 169/64  Pulse: 70 76 82 74  Resp: 16 19 18 18   Temp: 99.3 F (37.4 C) 99.1 F (37.3 C) 98.9 F (37.2 C) 99.3 F (37.4 C)  TempSrc: Oral Oral Oral Oral  SpO2: 97% 100% 94% 100%    Intake/Output Summary (Last 24 hours) at  06/06/2020 1921 Last data filed at 06/06/2020 1855 Gross per 24 hour  Intake 3356.57 ml  Output 550 ml  Net 2806.57 ml   There were no vitals filed for this visit.  Data Reviewed: I have personally reviewed and interpreted daily labs, tele strips, imaging. I reviewed all nursing notes, pharmacy notes, vitals, pertinent old records I have discussed plan of care as described above with RN and patient/family.  CBC: Recent Labs  Lab 06/02/20 1116 06/04/20 0450 06/06/20 0639  WBC 8.6 5.9 5.6  NEUTROABS 6.6  --   --   HGB 14.0 12.7 11.2*  HCT 40.4 37.5 33.7*  MCV 91.2 92.6 96.3  PLT 211 173 700   Basic Metabolic Panel: Recent Labs  Lab 06/02/20 1116 06/03/20 0100 06/04/20 0450 06/06/20 0639  NA 128* 130* 134* 137  K 3.4* 3.4* 3.7 3.7  CL 80* 89* 94* 99  CO2 35* 33* 33* 34*  GLUCOSE 130* 106* 115* 117*  BUN 19 19 13  5*  CREATININE 1.20* 1.00 0.87 0.75  CALCIUM 10.4* 9.4 9.1 8.2*  MG 1.7  --  1.6* 1.8  PHOS 5.0*  --   --   --     Studies: No results found.  Scheduled Meds: . cloNIDine  0.1 mg Transdermal Weekly  . clorazepate  7.5 mg Oral BID  . enoxaparin (LOVENOX) injection  40 mg Subcutaneous Q24H   Continuous Infusions: . dextrose 5 % and 0.45 % NaCl with KCl 20 mEq/L 50 mL/hr at 06/06/20 1252   PRN Meds: hydrALAZINE, HYDROmorphone (DILAUDID) injection, LORazepam, ondansetron (ZOFRAN) IV, phenol  Time spent: 35 minutes  Author: Berle Mull, MD [facility] 06/06/2020 7:21 PM  To reach On-call, see care teams to locate the attending and reach out via [email or web address]. Between 7PM-7AM, please contact night-coverage If you still have difficulty reaching the attending provider, please page the [facility] (Director on Call) for [facility] on amion for assistance.

## 2020-06-06 NOTE — Progress Notes (Signed)
NGT removed per order. Patient tolerated well. Clear liquid tray ordered for patient. Will monitor for any nausea or vomiting.

## 2020-06-07 DIAGNOSIS — K420 Umbilical hernia with obstruction, without gangrene: Secondary | ICD-10-CM

## 2020-06-07 DIAGNOSIS — E860 Dehydration: Secondary | ICD-10-CM

## 2020-06-07 MED ORDER — ACETAMINOPHEN 325 MG PO TABS: 650.0000 mg | ORAL_TABLET | Freq: Four times a day (QID) | ORAL | Status: DC | PRN

## 2020-06-07 MED ORDER — LOSARTAN POTASSIUM 50 MG PO TABS
100.0000 mg | ORAL_TABLET | Freq: Every day | ORAL | Status: DC
  Administered 2020-06-07: 100 mg via ORAL
  Filled 2020-06-07: qty 2

## 2020-06-07 MED ORDER — FUROSEMIDE 10 MG/ML IJ SOLN
20.0000 mg | Freq: Once | INTRAMUSCULAR | Status: AC
  Administered 2020-06-07: 20 mg via INTRAVENOUS
  Filled 2020-06-07: qty 2

## 2020-06-07 MED ORDER — POLYETHYLENE GLYCOL 3350 17 G PO PACK: 17.0000 g | PACK | Freq: Every day | ORAL | 0 refills | Status: DC

## 2020-06-07 NOTE — Discharge Summary (Signed)
Physician Discharge Summary  Felicia Sawyer FGH:[id number] DOB: [birth year] DOA: 06/02/2020  PCP: Lauree Chandler, NP  Admit date: 06/02/2020 Discharge date: 06/07/2020  Admitted From: Home Disposition: Home  Recommendations for Outpatient Follow-up:  1. Follow up with PCP in 1-2 weeks 2. Please obtain BMP/CBC in one week 3. Patient has been offered elective follow-up with general surgery to discuss ventral hernia repair 4. She will need outpatient MRI of abdomen to evaluate left renal hypodensity 5. Consider outpatient GYN referral for uterine fibroids  Discharge Condition: Stable CODE STATUS: Full code Diet recommendation: Heart healthy  Brief/Interim Summary: 80 year old female with a history of hypertension, hyperlipidemia, prior cholecystectomy and inguinal hernia repair, presented with complaints of abdominal pain.  Found to have small bowel obstruction.  She was admitted for further management.  Seen by general surgery and treated conservatively with bowel rest and NG tube decompression.  Discharge Diagnoses:  Active Problems:   SBO (small bowel obstruction) ([place])  1. Small bowel obstruction CT abdomen with contrast shows SBO with transition point within the left lower midabdomen. NG tube inserted. Treated with IV fluids. General surgery consulted. NG tube removed on 4/12.  Started on clear liquid diet. By 4/13, she was tolerating solid diet, having bowel movements, passing flatus and was not having any vomiting. Discussed with surgery and it was felt reasonable to discharge the patient for outpatient follow-up  2. Essential hypertension with hypertensive urgency Wide pulse pressure 112 secondary to isolated systolic hypertension Blood pressure elevated., Pulse pressure improved. On clonidine 0.1 mg twice daily at home. Currently on clonidine patch 0.1. Also on Lasix and losartan which were initially held due to renal failure As needed hydralazine. Overall  blood pressures have stabilized -Continue losartan, clonidine and Lasix on discharge  3. Bilateral pleural effusion with bibasilar atelectasis Seen incidentally on CT scan. Incentive spirometry. Currently not hypoxic. She does have some mild lower extremity edema Resume Lasix on discharge  4. Acute kidney injury  Baseline serum creatinine 0.8. On presentation serum creatinine 1.2. Evidence of hemoconcentration. Creatinine improved to 0.7 with IV fluids  5. Hypokalemia Mild hyponatremia. No significant Hypomagnesemia. Potassium and magnesium were replaced  6. 1.1 cm left renal hypodensity Seen incidentally. Outpt MRI renal protocol recommended.  7. Anxiety and depression  Continue clorazepate per home dose per patient desire.  8. Hyperlipidemia Resume statin on discharge  9. Uterine fibroids Incidentally seen on the CT scan. Outpatient follow-up with GYN.  10. Umbilical hernia Without any ischemia or obstruction Outpatient follow-up with general surgery to discuss repair  Discharge Instructions  Discharge Instructions    Diet - low sodium heart healthy   Complete by: As directed    Increase activity slowly   Complete by: As directed      Allergies as of 06/07/2020      Reactions   Caffeine Other (See Comments)   Rapid heart rate    Demerol [meperidine]    Venlafaxine Hcl Other (See Comments)   Hyper   Pseudoephedrine Palpitations      Medication List    TAKE these medications   aspirin 81 MG tablet Take 81 mg by mouth at bedtime.   cholecalciferol 1000 units tablet Commonly known as: VITAMIN D Take 1,000 Units by mouth daily.   cloNIDine 0.1 MG tablet Commonly known as: CATAPRES TAKE 1 TABLET BY MOUTH TWICE A DAY TO CONTROL BLOOD PRESSURE What changed: See the new instructions.   clorazepate 7.5 MG tablet Commonly known as: TRANXENE TAKE 1 TABLET (7.5  MG TOTAL) BY MOUTH 2 (TWO) TIMES DAILY.   fexofenadine 180 MG tablet Commonly  known as: ALLEGRA Take 180 mg by mouth daily as needed for allergies.   furosemide 40 MG tablet Commonly known as: LASIX TAKE 1 TABLET BY MOUTH EVERY DAY   GARLIC PO Take 1 tablet by mouth daily.   losartan 100 MG tablet Commonly known as: COZAAR TAKE 1 TABLET BY MOUTH EVERY DAY FOR BLOOD PRESSURE What changed: See the new instructions.   Metamucil Plus Calcium Caps Take 3 capsules by mouth 3 (three) times daily.   polyethylene glycol 17 g packet Commonly known as: MiraLax Take 17 g by mouth daily.   simvastatin 40 MG tablet Commonly known as: ZOCOR TAKE 1 TABLET BY MOUTH EVERY DAY TO LOWER CHOLESTEROL What changed: See the new instructions.       Follow-up Information    [facility], [place] Follow up.   Specialty: General Surgery Why: For follow up if you would like to discuss elective repair of your hernia Contact information: [address] [place] [address] [phone]        Lauree Chandler, NP Follow up.   Specialty: Geriatric Medicine Why: you will need elective MRI of abdomen to evaluate hypodensity on left kidney. Consider outpatient referral to gynecology for uterine fibroids Contact information: [place] [place] [address] [phone]              Allergies  Allergen Reactions  . Caffeine Other (See Comments)    Rapid heart rate   . Demerol [Meperidine]   . Venlafaxine Hcl Other (See Comments)    Hyper  . Pseudoephedrine Palpitations    Consultations:  General surgery    Procedures/Studies: CT ABDOMEN PELVIS W CONTRAST  Result Date: 06/02/2020 CLINICAL DATA:  Bowel obstruction suspected. EXAM: CT ABDOMEN AND PELVIS WITH CONTRAST TECHNIQUE: Multidetector CT imaging of the abdomen and pelvis was performed using the standard protocol following bolus administration of intravenous contrast. CONTRAST:  15mL OMNIPAQUE IOHEXOL 300 MG/ML  SOLN COMPARISON:  None. FINDINGS: Lower chest: Bilateral trace pleural  effusions. Bilateral lower lobe passive atelectasis. Bibasilar atelectasis versus scarring. The esophagus is dilated with fluid. Hepatobiliary: No focal liver abnormality. Status post cholecystectomy with intra and extrahepatic biliary ductal dilatation which can be seen in the post cholecystectomy setting. Pancreas: No focal lesion. Normal pancreatic contour. No surrounding inflammatory changes. No main pancreatic ductal dilatation. Spleen: Normal in size without focal abnormality. Adrenals/Urinary Tract: No adrenal nodule bilaterally. Bilateral kidneys enhance symmetrically. Fluid density lesions likely represent simple renal cysts. Subcentimeter hypodensities are too small to characterize. There is a 1.1 cm hypodensity within left kidney that demonstrates a density of 29 Hounsfield units. No hydronephrosis. No hydroureter. The urinary bladder is unremarkable. Stomach/Bowel: Fluid distension of the gastric lumen. Fluid distension of the proximal and mid small bowel up to 3.3 cm with a transition point in the left lower mid abdomen (6:27, 3:64). Small bowel distally is collapsed. No bowel thickening or pneumatosis. No large bowel dilatation or bowel wall thickening. Stool is noted within the ascending colon. Diffuse sigmoid diverticulosis. The appendix is not definitely identified. Vascular/Lymphatic: No abdominal aorta or iliac aneurysm. Severe atherosclerotic plaque of the aorta and its branches. No abdominal, pelvic, or inguinal lymphadenopathy. Reproductive: Multiple coarsely calcified lesions within the urine walls likely represent fibroids. Otherwise the uterus and bilateral adnexa are unremarkable. Other: No intraperitoneal free fluid. No intraperitoneal free gas. No organized fluid collection. Musculoskeletal: Small fat containing  umbilical hernia with an abdominal defect of 2.2 cm. No suspicious lytic or blastic osseous lesions. No acute displaced fracture. Multilevel degenerative changes of the spine.  Grade 1 anterolisthesis of L4 on L5. IMPRESSION: 1. Small-bowel obstruction with a transition point within the left lower mid abdomen. Recommend enteric tube placement given fluid dilatation of the visualized distal esophagus. 2. Bilateral trace pleural effusions. 3. Indeterminate 1.1 cm left renal hypodensity. Recommend MRI renal protocol for further evaluation. 4. Small fat containing umbilical hernia with an abdominal defect of 2.2 cm. No findings suggest ischemia or associated bowel obstruction. 5. Diffuse sigmoid diverticulosis with no acute diverticulitis. 6. Multiple uterine fibroids. 7.  Aortic Atherosclerosis (ICD10-I70.0). These results will be called to the ordering clinician or representative by the Radiologist Assistant, and communication documented in the PACS or [employer]. Electronically Signed   By: Iven Finn M.D.   On: 06/02/2020 23:27   DG Abdomen Acute W/Chest  Result Date: 06/02/2020 CLINICAL DATA:  Abdominal pain, nausea, and vomiting. Abdominal hernia. EXAM: DG ABDOMEN ACUTE WITH 1 VIEW CHEST COMPARISON:  Chest radiographs 04/02/2007 FINDINGS: The cardiomediastinal silhouette is unchanged with normal heart size. Aortic atherosclerosis is noted. Lung volumes are low with linear opacities in the right greater than left lung bases. There may be small bilateral pleural effusions. No edema or pneumothorax is evident. No intraperitoneal free air is identified. There are multiple dilated loops of small bowel in the right lower quadrant measuring up to approximately 4 cm in diameter with air-fluid levels. A small amount of gas and stool are present in nondilated colon. Coarse calcifications in the pelvis likely reflect fibroids. There is mild upper lumbar levoscoliosis. IMPRESSION: 1. Dilated small bowel loops in the right lower quadrant consistent with obstruction. 2. Low lung volumes with bibasilar atelectasis and possible small pleural effusions. Electronically Signed   By: Logan Bores M.D.   On: 06/02/2020 13:39   DG Abd Portable 1V  Result Date: 06/05/2020 CLINICAL DATA:  Small bowel obstruction EXAM: PORTABLE ABDOMEN - 1 VIEW COMPARISON:  June 03, 2020 FINDINGS: Nasogastric tube tip and side port are in the stomach. There is contrast in the colon. Loops of mildly dilated small bowel remain without air-fluid levels. No evident free air. Multiple calcifications in the pelvis are likely due to uterine leiomyomas. IMPRESSION: Nasogastric tube tip and side port in stomach. Loops of mildly dilated small bowel remain, with contrast in the colon. Question resolving bowel obstruction. There may be a degree of residual ileus. Apparent calcified uterine leiomyomas in the pelvis. Electronically Signed   By: Lowella Grip III M.D.   On: 06/05/2020 07:52   DG Abd Portable 1V-Small Bowel Obstruction Protocol-initial, 8 hr delay  Result Date: 06/03/2020 CLINICAL DATA:  Small-bowel obstruction 8 hour delay EXAM: PORTABLE ABDOMEN - 1 VIEW COMPARISON:  06/03/2020, CT 06/02/2020 FINDINGS: Esophageal tube tip and side port overlie proximal stomach. There is dense contrast in the stomach. Dilated small bowel measuring up to 3.6 cm. Scattered colon gas and stool. No definite contrast within the colon or rectum. Calcified uterine fibroids. Contrast within the urinary bladder. IMPRESSION: Dilated small bowel with gas and stool present in the colon. Overall decreased dilated small bowel in right lower quadrant. There is dense contrast within the stomach but no definitive contrast within the colon or rectum. Calcified uterine fibroids Electronically Signed   By: Donavan Foil M.D.   On: 06/03/2020 21:45   DG Abd Portable 1V  Result Date: 06/03/2020 CLINICAL DATA:  Follow-up  small bowel obstruction. EXAM: PORTABLE ABDOMEN - 1 VIEW COMPARISON:  CT abdomen and pelvis and acute abdomen series yesterday. FINDINGS: Nasogastric tube tip projects over the fundus of the stomach. Persistent moderate distension  of multiple loops of small bowel throughout the abdomen, slightly improved since yesterday. Gas and stool throughout normal caliber colon. No suggestion of free air on the supine image. Numerous calcified uterine fibroids again noted. IMPRESSION: 1. Slight improvement in the partial small bowel obstruction since yesterday. No suggestion of free intraperitoneal air on the supine image. 2. Nasogastric tube tip projects over the fundus of the stomach. Electronically Signed   By: Evangeline Dakin M.D.   On: 06/03/2020 10:47       Subjective: Tolerating soft foods.  No vomiting.  She is having bowel movements.  Discharge Exam: Vitals:   06/06/20 2223 06/06/20 2238 06/07/20 0430 06/07/20 1510  BP:  (!) 197/66 (!) 168/66 (!) 150/58  Pulse: 72 72 89 81  Resp: 18 16 16 18   Temp: 99.3 F (37.4 C) 98.6 F (37 C) 98.2 F (36.8 C) 99.4 F (37.4 C)  TempSrc: Oral Oral Oral Oral  SpO2: 97% 98% 98% 96%    General: Pt is alert, awake, not in acute distress Cardiovascular: RRR, S1/S2 +, no rubs, no gallops Respiratory: CTA bilaterally, no wheezing, no rhonchi Abdominal: Soft, NT, ND, bowel sounds + Extremities: no edema, no cyanosis    The results of significant diagnostics from this hospitalization (including imaging, microbiology, ancillary and laboratory) are listed below for reference.     Microbiology: Recent Results (from the past 240 hour(s))  Resp Panel by RT-PCR (Flu A&B, Covid) Nasopharyngeal Swab     Status: None   Collection Time: 06/02/20  2:11 PM   Specimen: Nasopharyngeal Swab; Nasopharyngeal(NP) swabs in vial transport medium  Result Value Ref Range Status   SARS Coronavirus 2 by RT PCR NEGATIVE NEGATIVE Final    Comment: (NOTE) SARS-CoV-2 target nucleic acids are NOT DETECTED.  The SARS-CoV-2 RNA is generally detectable in upper respiratory specimens during the acute phase of infection. The lowest concentration of SARS-CoV-2 viral copies this assay can detect is 138  copies/mL. A negative result does not preclude SARS-Cov-2 infection and should not be used as the sole basis for treatment or other patient management decisions. A negative result may occur with  improper specimen collection/handling, submission of specimen other than nasopharyngeal swab, presence of viral mutation(s) within the areas targeted by this assay, and inadequate number of viral copies(<138 copies/mL). A negative result must be combined with clinical observations, patient history, and epidemiological information. The expected result is Negative.  Fact Sheet for Patients:  EntrepreneurPulse.com.au  Fact Sheet for Healthcare Providers:  IncredibleEmployment.be  This test is no t yet approved or cleared by the [place] FDA and  has been authorized for detection and/or diagnosis of SARS-CoV-2 by FDA under an Emergency Use Authorization (EUA). This EUA will remain  in effect (meaning this test can be used) for the duration of the COVID-19 declaration under Section 564(b)(1) of the Act, 21 U.S.C.section 360bbb-3(b)(1), unless the authorization is terminated  or revoked sooner.       Influenza A by PCR NEGATIVE NEGATIVE Final   Influenza B by PCR NEGATIVE NEGATIVE Final    Comment: (NOTE) The Xpert Xpress SARS-CoV-2/FLU/RSV plus assay is intended as an aid in the diagnosis of influenza from Nasopharyngeal swab specimens and should not be used as a sole basis for treatment. Nasal washings and aspirates are unacceptable for  Xpert Xpress SARS-CoV-2/FLU/RSV testing.  Fact Sheet for Patients: EntrepreneurPulse.com.au  Fact Sheet for Healthcare Providers: IncredibleEmployment.be  This test is not yet approved or cleared by the [place] FDA and has been authorized for detection and/or diagnosis of SARS-CoV-2 by FDA under an Emergency Use Authorization (EUA). This EUA will remain in effect (meaning  this test can be used) for the duration of the COVID-19 declaration under Section 564(b)(1) of the Act, 21 U.S.C. section 360bbb-3(b)(1), unless the authorization is terminated or revoked.  Performed at [facility], [place] [address] [place], [place] [address]      Labs: BNP (last 3 results) No results for input(s): BNP in the last 8760 hours. Basic Metabolic Panel: Recent Labs  Lab 06/02/20 1116 06/03/20 0100 06/04/20 0450 06/06/20 0639  NA 128* 130* 134* 137  K 3.4* 3.4* 3.7 3.7  CL 80* 89* 94* 99  CO2 35* 33* 33* 34*  GLUCOSE 130* 106* 115* 117*  BUN 19 19 13  5*  CREATININE 1.20* 1.00 0.87 0.75  CALCIUM 10.4* 9.4 9.1 8.2*  MG 1.7  --  1.6* 1.8  PHOS 5.0*  --   --   --    Liver Function Tests: Recent Labs  Lab 06/02/20 1116  AST 25  ALT 16  ALKPHOS 63  BILITOT 1.3*  PROT 7.2  ALBUMIN 3.9   Recent Labs  Lab 06/02/20 1116  LIPASE 42   No results for input(s): AMMONIA in the last 168 hours. CBC: Recent Labs  Lab 06/02/20 1116 06/04/20 0450 06/06/20 0639  WBC 8.6 5.9 5.6  NEUTROABS 6.6  --   --   HGB 14.0 12.7 11.2*  HCT 40.4 37.5 33.7*  MCV 91.2 92.6 96.3  PLT 211 173 174   Cardiac Enzymes: No results for input(s): CKTOTAL, CKMB, CKMBINDEX, TROPONINI in the last 168 hours. BNP: Invalid input(s): POCBNP CBG: No results for input(s): GLUCAP in the last 168 hours. D-Dimer No results for input(s): DDIMER in the last 72 hours. Hgb A1c No results for input(s): HGBA1C in the last 72 hours. Lipid Profile No results for input(s): CHOL, HDL, LDLCALC, TRIG, CHOLHDL, LDLDIRECT in the last 72 hours. Thyroid function studies No results for input(s): TSH, T4TOTAL, T3FREE, THYROIDAB in the last 72 hours.  Invalid input(s): FREET3 Anemia work up No results for input(s): VITAMINB12, FOLATE, FERRITIN, TIBC, IRON, RETICCTPCT in the last 72 hours. Urinalysis No results found for: COLORURINE, APPEARANCEUR, [place], [place], GLUCOSEU, [place], [place],  [place], PROTEINUR, UROBILINOGEN, NITRITE, LEUKOCYTESUR Sepsis Labs Invalid input(s): PROCALCITONIN,  WBC,  LACTICIDVEN Microbiology Recent Results (from the past 240 hour(s))  Resp Panel by RT-PCR (Flu A&B, Covid) Nasopharyngeal Swab     Status: None   Collection Time: 06/02/20  2:11 PM   Specimen: Nasopharyngeal Swab; Nasopharyngeal(NP) swabs in vial transport medium  Result Value Ref Range Status   SARS Coronavirus 2 by RT PCR NEGATIVE NEGATIVE Final    Comment: (NOTE) SARS-CoV-2 target nucleic acids are NOT DETECTED.  The SARS-CoV-2 RNA is generally detectable in upper respiratory specimens during the acute phase of infection. The lowest concentration of SARS-CoV-2 viral copies this assay can detect is 138 copies/mL. A negative result does not preclude SARS-Cov-2 infection and should not be used as the sole basis for treatment or other patient management decisions. A negative result may occur with  improper specimen collection/handling, submission of specimen other than nasopharyngeal swab, presence of viral mutation(s) within the areas targeted by this assay, and inadequate number of viral copies(<138 copies/mL). A  negative result must be combined with clinical observations, patient history, and epidemiological information. The expected result is Negative.  Fact Sheet for Patients:  EntrepreneurPulse.com.au  Fact Sheet for Healthcare Providers:  IncredibleEmployment.be  This test is no t yet approved or cleared by the [place] FDA and  has been authorized for detection and/or diagnosis of SARS-CoV-2 by FDA under an Emergency Use Authorization (EUA). This EUA will remain  in effect (meaning this test can be used) for the duration of the COVID-19 declaration under Section 564(b)(1) of the Act, 21 U.S.C.section 360bbb-3(b)(1), unless the authorization is terminated  or revoked sooner.       Influenza A by PCR NEGATIVE NEGATIVE  Final   Influenza B by PCR NEGATIVE NEGATIVE Final    Comment: (NOTE) The Xpert Xpress SARS-CoV-2/FLU/RSV plus assay is intended as an aid in the diagnosis of influenza from Nasopharyngeal swab specimens and should not be used as a sole basis for treatment. Nasal washings and aspirates are unacceptable for Xpert Xpress SARS-CoV-2/FLU/RSV testing.  Fact Sheet for Patients: EntrepreneurPulse.com.au  Fact Sheet for Healthcare Providers: IncredibleEmployment.be  This test is not yet approved or cleared by the [place] FDA and has been authorized for detection and/or diagnosis of SARS-CoV-2 by FDA under an Emergency Use Authorization (EUA). This EUA will remain in effect (meaning this test can be used) for the duration of the COVID-19 declaration under Section 564(b)(1) of the Act, 21 U.S.C. section 360bbb-3(b)(1), unless the authorization is terminated or revoked.  Performed at [facility], [place] [address], [place], [place] [address]      Time coordinating discharge: 77mins  SIGNED:   Kathie Dike, MD  [facility] 06/07/2020, 6:04 PM   If 7PM-7AM, please contact night-coverage [email or web address]

## 2020-06-07 NOTE — Progress Notes (Signed)
Subjective: CC: Patient reports that she did well yesterday with clear liquids.  She denies any abdominal pain, nausea, vomiting.  Continued to pass flatus and had a BM. She reports in the middle of the night she had some pain in her mid abdomen that resolved after pain medication. She has no abdominal pain or nausea this am. She has passed flatus x 2 this am.   Patient's daughter, York Cerise on the phone. She is a Marine scientist. With patients permission, I provided an update and answered all questions.   Objective: Vital signs in last 24 hours: Temp:  [98.2 F (36.8 C)-99.3 F (37.4 C)] 98.2 F (36.8 C) (04/13 0430) Pulse Rate:  [72-89] 89 (04/13 0430) Resp:  [16-18] 16 (04/13 0430) BP: (168-197)/(64-66) 168/66 (04/13 0430) SpO2:  [97 %-100 %] 98 % (04/13 0430) Last BM Date: 06/06/20  Intake/Output from previous day: 04/12 0701 - 04/13 0700 In: 1053.3 [I.V.:1053.3] Out: -  Intake/Output this shift: No intake/output data recorded.  PE: Gen: Alert, NAD, pleasant Card: Reg Pulm:Normal rate and effort Abd: Soft,ND, NT, normoactive bowel sounds. Hernia soft, NT and reducible when patient lies flat. Prior surgical scars are well healed. Psych: A&Ox3  Skin: no rashes noted, warm and dry  Lab Results:  Recent Labs    06/06/20 0639  WBC 5.6  HGB 11.2*  HCT 33.7*  PLT 174   BMET Recent Labs    06/06/20 0639  NA 137  K 3.7  CL 99  CO2 34*  GLUCOSE 117*  BUN 5*  CREATININE 0.75  CALCIUM 8.2*   PT/INR No results for input(s): LABPROT, INR in the last 72 hours. CMP     Component Value Date/Time   NA 137 06/06/2020 0639   NA 141 01/23/2015 0837   K 3.7 06/06/2020 0639   CL 99 06/06/2020 0639   CO2 34 (H) 06/06/2020 0639   GLUCOSE 117 (H) 06/06/2020 0639   BUN 5 (L) 06/06/2020 0639   BUN 12 01/23/2015 0837   CREATININE 0.75 06/06/2020 0639   CREATININE 0.80 12/02/2019 1423   CALCIUM 8.2 (L) 06/06/2020 0639   PROT 7.2 06/02/2020 1116   PROT 6.3 01/23/2015  0837   ALBUMIN 3.9 06/02/2020 1116   ALBUMIN 3.7 01/23/2015 0837   AST 25 06/02/2020 1116   ALT 16 06/02/2020 1116   ALKPHOS 63 06/02/2020 1116   BILITOT 1.3 (H) 06/02/2020 1116   BILITOT 0.5 01/23/2015 0837   GFRNONAA >60 06/06/2020 0639   GFRNONAA 71 12/02/2019 1423   GFRAA 82 12/02/2019 1423   Lipase     Component Value Date/Time   LIPASE 42 06/02/2020 1116       Studies/Results: No results found.  Anti-infectives: Anti-infectives (From admission, onward)   None       Assessment/Plan HTN HLD Diverticulosis Depression PVD Tobacco use disorder Ventral Hernia - CT 4/8 w/small fat containing hernia with an abdominal defect of 2.2 cm. No findings suggest ischemia or associated bowel obstruction.Reducible on exam. NT and without skin changes. Can follow up as outpatient to discuss elective repair.   Small bowel obstruction - CT 4/8 w small-bowel obstruction with a transition point within the left lower mid abdomen. -Abdominal surgical history: cholecystectomy, tubal ligation, TAH with vesicovaginal fistula closure, right inguinal hernia repair.Suspect SBO was 2/2 adhesions - Xray 4/11 with contrast in the colon. Patients tolerating cld without n/v and denies abdominal pain this AM. She is soft, ND and NT on exam. She is passing flatus  and had a BM yesterday. Adv to FLD. Will recheck in the afternoon to see if can adv to soft diet.  -Encourage OOB, ambulation  FEN -Adv to FLD, IVF per TRH.  VTE -SCDs, Loveonx ID -None. WBC 5.6 yesterday. Afebrile.    LOS: 5 days    Jillyn Ledger , [facility] 06/07/2020, 7:42 AM Please see Amion for pager number during day hours 7:00am-4:30pm

## 2020-06-07 NOTE — Progress Notes (Signed)
Hayden Rasmussen to be D/C'd  per MD order. Discussed with the patient and all questions fully answered.  VSS, Skin clean, dry and intact without evidence of skin break down, no evidence of skin tears noted.  IV catheter discontinued intact. Site without signs and symptoms of complications. Dressing and pressure applied.  An After Visit Summary was printed and given to the patient. Patient received prescription.  D/c education completed with patient/family including follow up instructions, medication list, d/c activities limitations if indicated, with other d/c instructions as indicated by MD - patient able to verbalize understanding, all questions fully answered.   Patient instructed to return to ED, call 911, or call MD for any changes in condition.   Patient to be escorted via [place], and D/C home via private auto.

## 2020-06-16 ENCOUNTER — Ambulatory Visit (INDEPENDENT_AMBULATORY_CARE_PROVIDER_SITE_OTHER): Payer: Medicare HMO | Admitting: Nurse Practitioner

## 2020-06-16 ENCOUNTER — Encounter: Payer: Self-pay | Admitting: Nurse Practitioner

## 2020-06-16 ENCOUNTER — Other Ambulatory Visit: Payer: Self-pay

## 2020-06-16 VITALS — BP 132/80 | HR 96 | Temp 97.1°F | Ht 61.0 in | Wt 140.0 lb

## 2020-06-16 DIAGNOSIS — D259 Leiomyoma of uterus, unspecified: Secondary | ICD-10-CM

## 2020-06-16 DIAGNOSIS — N289 Disorder of kidney and ureter, unspecified: Secondary | ICD-10-CM

## 2020-06-16 DIAGNOSIS — R600 Localized edema: Secondary | ICD-10-CM

## 2020-06-16 DIAGNOSIS — N179 Acute kidney failure, unspecified: Secondary | ICD-10-CM

## 2020-06-16 DIAGNOSIS — E785 Hyperlipidemia, unspecified: Secondary | ICD-10-CM

## 2020-06-16 DIAGNOSIS — F17200 Nicotine dependence, unspecified, uncomplicated: Secondary | ICD-10-CM

## 2020-06-16 DIAGNOSIS — H25013 Cortical age-related cataract, bilateral: Secondary | ICD-10-CM | POA: Diagnosis not present

## 2020-06-16 DIAGNOSIS — I10 Essential (primary) hypertension: Secondary | ICD-10-CM | POA: Diagnosis not present

## 2020-06-16 DIAGNOSIS — K429 Umbilical hernia without obstruction or gangrene: Secondary | ICD-10-CM

## 2020-06-16 DIAGNOSIS — I700 Atherosclerosis of aorta: Secondary | ICD-10-CM

## 2020-06-16 MED ORDER — FUROSEMIDE 20 MG PO TABS: 20.0000 mg | ORAL_TABLET | Freq: Every day | ORAL | 0 refills | Status: DC

## 2020-06-16 NOTE — Progress Notes (Signed)
Careteam: Patient Care Team: Lauree Chandler, NP as PCP - General (Geriatric Medicine)  PLACE OF SERVICE:  [facility] Directive information Does Patient Have a Medical Advance Directive?: No, Does patient want to make changes to medical advance directive?: No - Patient declined  Allergies  Allergen Reactions  . Caffeine Other (See Comments)    Rapid heart rate   . Demerol [Meperidine]   . Venlafaxine Hcl Other (See Comments)    Hyper  . Pseudoephedrine Palpitations    Chief Complaint  Patient presents with  . Hospitalization Follow-up     HPI: Patient is a 80 y.o. female for hospital follow up.  She has a history of hypertension, hyperlipidemia, prior cholecystectomy and inguinal hernia repair, presented with complaints of abdominal pain.  Found to have small bowel obstruction.  she was treated with IV fluids and NG tube.  Not a big eater but eating and drinking well at this time. Eating more soft and high fiber foods.   Ongoing constipation- stopped metamucil bc she was not drinking enough water Daughter has put her on colace and senakot.   She has had uterine fibroids but daughter and patient were aware of this. Has already talked to GYN about this and not symptomatic. Discussed removal in the past but did not wish to do this.   She reports she has had so many scans- would like to hold off on the MRI for renal hypodensity.   Does not feel like she is conditioned enough for ventral hernia repair. umbilcal hernia is now soft.  Review of Systems:  Review of Systems  Constitutional: Negative for chills, fever and weight loss.  HENT: Negative for tinnitus.   Respiratory: Negative for cough, sputum production and shortness of breath.   Cardiovascular: Negative for chest pain, palpitations and leg swelling.  Gastrointestinal: Positive for constipation. Negative for abdominal pain, diarrhea and heartburn.  Genitourinary: Negative for dysuria, frequency and  urgency.  Musculoskeletal: Negative for back pain, joint pain and myalgias.  Skin: Negative.   Neurological: Positive for weakness. Negative for dizziness and headaches.  Psychiatric/Behavioral: Negative for depression and memory loss. The patient does not have insomnia.     Past Medical History:  Diagnosis Date  . Abnormality of gait 07/11/2005  . Allergy   . Anxiety state, unspecified 04/08/2012  . Asymptomatic varicose veins 09/24/2011  . Cataract    bilateral  . Chronic venous insufficiency 09/16/2012  . DDD (degenerative disc disease), cervical   . Degenerative and vascular disorders of ear, unspecified 08/25/2002  . Depressive disorder, not elsewhere classified 11/10/2002  . Diverticulosis of colon (without mention of hemorrhage) 08/25/1989  . Edema 09/24/2011  . Esophageal reflux 11/10/2002   pt denies reflux  . First degree atrioventricular block 07/19/2008  . Heart murmur   . Hypertension   . Inguinal hernia without mention of obstruction or gangrene, unilateral or unspecified, (not specified as recurrent) 11/28/2006  . Insomnia, unspecified 12/15/2007  . Leiomyoma of uterus, unspecified 05/27/2006  . Leukocytopenia, unspecified 08/28/2010  . Lumbago 07/22/2007  . Obesity, unspecified 05/26/1996  . Osteoarthrosis, unspecified whether generalized or localized, unspecified site 05/26/2004  . Other abnormal blood chemistry 07/19/2008  . Other and unspecified hyperlipidemia 10/24/2004  . Palpitations 08/29/2007  . Peptic ulcer, unspecified site, unspecified as acute or chronic, without mention of hemorrhage, perforation, or obstruction 05/27/2006  . Scoliosis (and kyphoscoliosis), idiopathic 05/23/2009  . Symptomatic menopausal or female climacteric states 05/28/1990  . Unspecified essential hypertension 11/13/2004  Past Surgical History:  Procedure Laterality Date  . CHOLECYSTECTOMY  1971  . ENDOMETRIAL BIOPSY  03/1992   Dr. Mallie Mussel  . FOOT SURGERY Left 04/2003    [facility]- Dr. Caffie Pinto  . INGUINAL HERNIA REPAIR Right 03/2007   Dr. Georgette Dover  . KNEE ARTHROSCOPY Left 07/1997   Dr. Wonda Olds  . KNEE SURGERY Right 03/02/2004   Dr. Berenice Primas  . OOPHORECTOMY Right 1974   Dr. Amalia Hailey  . THUMB ARTHROSCOPY Right 1995   Dr.Sypher  . TONSILLECTOMY AND ADENOIDECTOMY  1975  . TUBAL LIGATION    . VESICOVAGINAL FISTULA CLOSURE W/ TAH  1975   Social History:   reports that she has been smoking cigarettes. She has a 15.00 pack-year smoking history. She has never used smokeless tobacco. She reports that she does not drink alcohol and does not use drugs.  Family History  Problem Relation Age of Onset  . Kidney disease Mother   . Hypertension Mother   . Heart disease Father   . Diabetes Father   . Hypertension Sister   . Atrial fibrillation Daughter   . CVA Daughter   . Drug abuse Son   . Osteoporosis Daughter   . Hypertension Daughter   . Colon cancer Neg Hx   . Colon polyps Neg Hx   . Rectal cancer Neg Hx   . Stomach cancer Neg Hx   . Esophageal cancer Neg Hx     Medications: Patient's Medications  New Prescriptions   No medications on file  Previous Medications   ASPIRIN 81 MG TABLET    Take 81 mg by mouth at bedtime.   CHOLECALCIFEROL (VITAMIN D) 1000 UNITS TABLET    Take 1,000 Units by mouth daily.   CLONIDINE (CATAPRES) 0.1 MG TABLET    TAKE 1 TABLET BY MOUTH TWICE A DAY TO CONTROL BLOOD PRESSURE   CLORAZEPATE (TRANXENE) 7.5 MG TABLET    TAKE 1 TABLET (7.5 MG TOTAL) BY MOUTH 2 (TWO) TIMES DAILY.   DOCUSATE SODIUM (COLACE) 100 MG CAPSULE    Take 100 mg by mouth daily.   FEXOFENADINE (ALLEGRA) 180 MG TABLET    Take 180 mg by mouth daily as needed for allergies.   FUROSEMIDE (LASIX) 40 MG TABLET    TAKE 1 TABLET BY MOUTH EVERY DAY   GARLIC PO    Take 1 tablet by mouth daily.    LOSARTAN (COZAAR) 100 MG TABLET    TAKE 1 TABLET BY MOUTH EVERY DAY FOR BLOOD PRESSURE   SENNOSIDES-DOCUSATE SODIUM (SENOKOT-S) 8.6-50 MG TABLET    Take 1 tablet by mouth 2  (two) times daily.   SIMVASTATIN (ZOCOR) 40 MG TABLET    TAKE 1 TABLET BY MOUTH EVERY DAY TO LOWER CHOLESTEROL  Modified Medications   No medications on file  Discontinued Medications   POLYETHYLENE GLYCOL (MIRALAX) 17 G PACKET    Take 17 g by mouth daily.   PSYLLIUM-CALCIUM (METAMUCIL PLUS CALCIUM) CAPS    Take 3 capsules by mouth 3 (three) times daily.    Physical Exam:  Vitals:   06/16/20 1038  BP: 132/80  Pulse: 96  Temp: (!) 97.1 F (36.2 C)  TempSrc: Temporal  SpO2: 97%  Weight: 140 lb (63.5 kg)  Height: 5' 1" (1.549 m)   Body mass index is 26.45 kg/m. Wt Readings from Last 3 Encounters:  06/16/20 140 lb (63.5 kg)  12/02/19 152 lb (68.9 kg)  05/31/19 158 lb 9.6 oz (71.9 kg)    Physical Exam Constitutional:  General: She is not in acute distress.    Appearance: She is well-developed. She is not diaphoretic.  HENT:     Head: Normocephalic and atraumatic.     Mouth/Throat:     Pharynx: No oropharyngeal exudate.  Eyes:     Conjunctiva/sclera: Conjunctivae normal.     Pupils: Pupils are equal, round, and reactive to light.  Cardiovascular:     Rate and Rhythm: Normal rate and regular rhythm.     Heart sounds: Normal heart sounds.  Pulmonary:     Effort: Pulmonary effort is normal.     Breath sounds: Normal breath sounds.  Abdominal:     General: Bowel sounds are normal.     Palpations: Abdomen is soft.     Hernia: A hernia is present. Hernia is present in the umbilical area.     Comments: Large umbilical hernia,soft, reducible, nontender  Musculoskeletal:        General: No tenderness.     Cervical back: Normal range of motion and neck supple.  Skin:    General: Skin is warm and dry.  Neurological:     Mental Status: She is alert and oriented to person, place, and time.     Labs reviewed: Basic Metabolic Panel: Recent Labs    06/02/20 1116 06/03/20 0100 06/04/20 0450 06/06/20 0639  NA 128* 130* 134* 137  K 3.4* 3.4* 3.7 3.7  CL 80* 89* 94*  99  CO2 35* 33* 33* 34*  GLUCOSE 130* 106* 115* 117*  BUN _0 5*  CREATININE 1.20* 1.00 0.87 0.75  CALCIUM 10.4* 9.4 9.1 8.2*  MG 1.7  --  1.6* 1.8  PHOS 5.0*  --   --   --    Liver Function Tests: Recent Labs    12/02/19 1423 06/02/20 1116  AST 17 25  ALT 13 16  ALKPHOS  --  63  BILITOT 0.5 1.3*  PROT 6.4 7.2  ALBUMIN  --  3.9   Recent Labs    06/02/20 1116  LIPASE 42   No results for input(s): AMMONIA in the last 8760 hours. CBC: Recent Labs    12/02/19 1423 06/02/20 1116 06/04/20 0450 06/06/20 0639  WBC 7.2 8.6 5.9 5.6  NEUTROABS 4,558 6.6  --   --   HGB 15.1 14.0 12.7 11.2*  HCT 44.9 40.4 37.5 33.7*  MCV 87.7 91.2 92.6 96.3  PLT 237 211 173 174   Lipid Panel: Recent Labs    12/02/19 1423  CHOL 145  HDL 54  LDLCALC 77  TRIG 55  CHOLHDL 2.7   TSH: No results for input(s): TSH in the last 8760 hours. A1C: Lab Results  Component Value Date   HGBA1C 5.3 12/02/2019    CT ABDOMEN PELVIS W CONTRAST  Result Date: 06/02/2020 CLINICAL DATA:  Bowel obstruction suspected. EXAM: CT ABDOMEN AND PELVIS WITH CONTRAST TECHNIQUE: Multidetector CT imaging of the abdomen and pelvis was performed using the standard protocol following bolus administration of intravenous contrast. CONTRAST:  63m OMNIPAQUE IOHEXOL 300 MG/ML  SOLN COMPARISON:  None. FINDINGS: Lower chest: Bilateral trace pleural effusions. Bilateral lower lobe passive atelectasis. Bibasilar atelectasis versus scarring. The esophagus is dilated with fluid. Hepatobiliary: No focal liver abnormality. Status post cholecystectomy with intra and extrahepatic biliary ductal dilatation which can be seen in the post cholecystectomy setting. Pancreas: No focal lesion. Normal pancreatic contour. No surrounding inflammatory changes. No main pancreatic ductal dilatation. Spleen: Normal in size without focal abnormality. Adrenals/Urinary Tract: No adrenal nodule bilaterally.  Bilateral kidneys enhance symmetrically.  Fluid density lesions likely represent simple renal cysts. Subcentimeter hypodensities are too small to characterize. There is a 1.1 cm hypodensity within left kidney that demonstrates a density of 29 Hounsfield units. No hydronephrosis. No hydroureter. The urinary bladder is unremarkable. Stomach/Bowel: Fluid distension of the gastric lumen. Fluid distension of the proximal and mid small bowel up to 3.3 cm with a transition point in the left lower mid abdomen (6:27, 3:64). Small bowel distally is collapsed. No bowel thickening or pneumatosis. No large bowel dilatation or bowel wall thickening. Stool is noted within the ascending colon. Diffuse sigmoid diverticulosis. The appendix is not definitely identified. Vascular/Lymphatic: No abdominal aorta or iliac aneurysm. Severe atherosclerotic plaque of the aorta and its branches. No abdominal, pelvic, or inguinal lymphadenopathy. Reproductive: Multiple coarsely calcified lesions within the urine walls likely represent fibroids. Otherwise the uterus and bilateral adnexa are unremarkable. Other: No intraperitoneal free fluid. No intraperitoneal free gas. No organized fluid collection. Musculoskeletal: Small fat containing umbilical hernia with an abdominal defect of 2.2 cm. No suspicious lytic or blastic osseous lesions. No acute displaced fracture. Multilevel degenerative changes of the spine. Grade 1 anterolisthesis of L4 on L5. IMPRESSION: 1. Small-bowel obstruction with a transition point within the left lower mid abdomen. Recommend enteric tube placement given fluid dilatation of the visualized distal esophagus. 2. Bilateral trace pleural effusions. 3. Indeterminate 1.1 cm left renal hypodensity. Recommend MRI renal protocol for further evaluation. 4. Small fat containing umbilical hernia with an abdominal defect of 2.2 cm. No findings suggest ischemia or associated bowel obstruction. 5. Diffuse sigmoid diverticulosis with no acute diverticulitis. 6. Multiple  uterine fibroids. 7.  Aortic Atherosclerosis (ICD10-I70.0). These results will be called to the ordering clinician or representative by the Radiologist Assistant, and communication documented in the PACS or [employer]. Electronically Signed   By: Iven Finn M.D.   On: 06/02/2020 23:27   DG Abdomen Acute W/Chest  Result Date: 06/02/2020 CLINICAL DATA:  Abdominal pain, nausea, and vomiting. Abdominal hernia. EXAM: DG ABDOMEN ACUTE WITH 1 VIEW CHEST COMPARISON:  Chest radiographs 04/02/2007 FINDINGS: The cardiomediastinal silhouette is unchanged with normal heart size. Aortic atherosclerosis is noted. Lung volumes are low with linear opacities in the right greater than left lung bases. There may be small bilateral pleural effusions. No edema or pneumothorax is evident. No intraperitoneal free air is identified. There are multiple dilated loops of small bowel in the right lower quadrant measuring up to approximately 4 cm in diameter with air-fluid levels. A small amount of gas and stool are present in nondilated colon. Coarse calcifications in the pelvis likely reflect fibroids. There is mild upper lumbar levoscoliosis. IMPRESSION: 1. Dilated small bowel loops in the right lower quadrant consistent with obstruction. 2. Low lung volumes with bibasilar atelectasis and possible small pleural effusions. Electronically Signed   By: Logan Bores M.D.   On: 06/02/2020 13:39   DG Abd Portable 1V-Small Bowel Obstruction Protocol-initial, 8 hr delay  Result Date: 06/03/2020 CLINICAL DATA:  Small-bowel obstruction 8 hour delay EXAM: PORTABLE ABDOMEN - 1 VIEW COMPARISON:  06/03/2020, CT 06/02/2020 FINDINGS: Esophageal tube tip and side port overlie proximal stomach. There is dense contrast in the stomach. Dilated small bowel measuring up to 3.6 cm. Scattered colon gas and stool. No definite contrast within the colon or rectum. Calcified uterine fibroids. Contrast within the urinary bladder. IMPRESSION: Dilated  small bowel with gas and stool present in the colon. Overall decreased dilated small bowel in right lower quadrant.  There is dense contrast within the stomach but no definitive contrast within the colon or rectum. Calcified uterine fibroids Electronically Signed   By: Donavan Foil M.D.   On: 06/03/2020 21:45   DG Abd Portable 1V  Result Date: 06/03/2020 CLINICAL DATA:  Follow-up small bowel obstruction. EXAM: PORTABLE ABDOMEN - 1 VIEW COMPARISON:  CT abdomen and pelvis and acute abdomen series yesterday. FINDINGS: Nasogastric tube tip projects over the fundus of the stomach. Persistent moderate distension of multiple loops of small bowel throughout the abdomen, slightly improved since yesterday. Gas and stool throughout normal caliber colon. No suggestion of free air on the supine image. Numerous calcified uterine fibroids again noted. IMPRESSION: 1. Slight improvement in the partial small bowel obstruction since yesterday. No suggestion of free intraperitoneal air on the supine image. 2. Nasogastric tube tip projects over the fundus of the stomach. Electronically Signed   By: Evangeline Dakin M.D.   On: 06/03/2020 10:47   Assessment/Plan 1. Essential hypertension -controlled on current regimen, continue dietary modifications. - CMP with eGFR([facility]) - CBC with Differential/Platelet - furosemide (LASIX) 20 MG tablet; Take 1 tablet (20 mg total) by mouth daily.  Dispense: 90 tablet; Refill: 0  2. Cortical age-related cataract of both eyes Unsure if she wants to have second cataract surgery at this time due to just coming out of hospital.   3. Hyperlipidemia, unspecified hyperlipidemia type -continues on zocor  4. AKI (acute kidney injury) ([place]) -encouraged to increase hydration at this time. Will follow up labs. - CMP with eGFR([facility]) - furosemide (LASIX) 20 MG tablet; Take 1 tablet (20 mg total) by mouth daily.  Dispense: 90 tablet; Refill: 0  5. Tobacco use disorder Cessation encouraged.    6. Leg edema -improved since hospitalization. She has been taking lasix 40 mg daily for this however with recent AKI and decrease in hydration will decrease to lasix 20 mg daily  - furosemide (LASIX) 20 MG tablet; Take 1 tablet (20 mg total) by mouth daily.  Dispense: 90 tablet; Refill: 0  7. Renal lesion Noted on CT abdomen- Indeterminate 1.1 cm left renal hypodensity. Recommend MRI renal protocol for further evaluation. Discussed finding with daughter and pt, they do not wish to have MRI ordered at this time due to all the imaging done during hospitalization- may consider follow up at upcoming appt.   8. Uterine leiomyoma, unspecified location Pt with hx of multiple uterine fibroids that she has discussed with her GYN in the past, she did not wish for them to be removed then bc she was asymptomatic and continues to be without symptom.   9. Aortic atherosclerosis ([place]) Noted on imaging.  10. Umbilical hernia without obstruction and without gangrene Easily reducible and without pain at this time. Does not wish to follow up with surgery at this time due to recent hospitalization.  -precautions provided   Next appt: 3 months. Carlos American. [place], [facility] [phone]

## 2020-06-16 NOTE — Patient Instructions (Addendum)
Decrease lasix to 20 mg by mouth daily   To use nutritional supplement (ensure, boost, carnation, resource breeze) daily  Increase hydration- 64 oz fluids a day

## 2020-06-17 LAB — COMPLETE METABOLIC PANEL WITH GFR
AG Ratio: 1.3 (calc) (ref 1.0–2.5)
ALT: 13 U/L (ref 6–29)
AST: 12 U/L (ref 10–35)
Albumin: 3.8 g/dL (ref 3.6–5.1)
Alkaline phosphatase (APISO): 75 U/L (ref 37–153)
BUN/Creatinine Ratio: 12 (calc) (ref 6–22)
BUN: 12 mg/dL (ref 7–25)
CO2: 31 mmol/L (ref 20–32)
Calcium: 9 mg/dL (ref 8.6–10.4)
Chloride: 98 mmol/L (ref 98–110)
Creat: 1.01 mg/dL — ABNORMAL HIGH (ref 0.60–0.93)
GFR, Est African American: 62 mL/min/{1.73_m2} (ref >=60)
GFR, Est Non African American: 53 mL/min/{1.73_m2} — ABNORMAL LOW (ref >=60)
Globulin: 2.9 g/dL (calc) (ref 1.9–3.7)
Glucose, Bld: 106 mg/dL (ref 65–139)
Potassium: 3.7 mmol/L (ref 3.5–5.3)
Sodium: 138 mmol/L (ref 135–146)
Total Bilirubin: 0.4 mg/dL (ref 0.2–1.2)
Total Protein: 6.7 g/dL (ref 6.1–8.1)

## 2020-06-17 LAB — CBC WITH DIFFERENTIAL/PLATELET
Absolute Monocytes: 567 cells/uL (ref 200–950)
Basophils Absolute: 53 cells/uL (ref 0–200)
Basophils Relative: 1 %
Eosinophils Absolute: 101 cells/uL (ref 15–500)
Eosinophils Relative: 1.9 %
HCT: 37.3 % (ref 35.0–45.0)
Hemoglobin: 11.9 g/dL (ref 11.7–15.5)
Lymphs Abs: 1171 cells/uL (ref 850–3900)
MCH: 30.4 pg (ref 27.0–33.0)
MCHC: 31.9 g/dL — ABNORMAL LOW (ref 32.0–36.0)
MCV: 95.2 fL (ref 80.0–100.0)
MPV: 9.3 fL (ref 7.5–12.5)
Monocytes Relative: 10.7 %
Neutro Abs: 3408 cells/uL (ref 1500–7800)
Neutrophils Relative %: 64.3 %
Platelets: 315 10*3/uL (ref 140–400)
RBC: 3.92 10*6/uL (ref 3.80–5.10)
RDW: 11.4 % (ref 11.0–15.0)
Total Lymphocyte: 22.1 %
WBC: 5.3 10*3/uL (ref 3.8–10.8)

## 2020-06-21 ENCOUNTER — Telehealth: Payer: Self-pay

## 2020-06-21 NOTE — Telephone Encounter (Signed)
Discussed lab results with patient, she verbalized her understanding.

## 2020-09-22 ENCOUNTER — Ambulatory Visit: Payer: Medicare HMO | Admitting: Nurse Practitioner

## 2020-10-02 ENCOUNTER — Other Ambulatory Visit: Payer: Self-pay

## 2020-10-02 ENCOUNTER — Ambulatory Visit (INDEPENDENT_AMBULATORY_CARE_PROVIDER_SITE_OTHER): Payer: Medicare HMO | Admitting: Family

## 2020-10-02 ENCOUNTER — Encounter: Payer: Self-pay | Admitting: Family

## 2020-10-02 ENCOUNTER — Ambulatory Visit: Payer: Medicare HMO | Admitting: Nurse Practitioner

## 2020-10-02 VITALS — BP 160/90 | HR 77 | Temp 96.9°F | Resp 16 | Ht 61.0 in | Wt 142.0 lb

## 2020-10-02 DIAGNOSIS — I10 Essential (primary) hypertension: Secondary | ICD-10-CM

## 2020-10-02 DIAGNOSIS — E663 Overweight: Secondary | ICD-10-CM

## 2020-10-02 DIAGNOSIS — Z23 Encounter for immunization: Secondary | ICD-10-CM

## 2020-10-02 DIAGNOSIS — Z6826 Body mass index (BMI) 26.0-26.9, adult: Secondary | ICD-10-CM

## 2020-10-02 DIAGNOSIS — K429 Umbilical hernia without obstruction or gangrene: Secondary | ICD-10-CM

## 2020-10-02 DIAGNOSIS — I739 Peripheral vascular disease, unspecified: Secondary | ICD-10-CM | POA: Diagnosis not present

## 2020-10-02 DIAGNOSIS — R600 Localized edema: Secondary | ICD-10-CM

## 2020-10-02 DIAGNOSIS — E785 Hyperlipidemia, unspecified: Secondary | ICD-10-CM | POA: Diagnosis not present

## 2020-10-02 DIAGNOSIS — K5901 Slow transit constipation: Secondary | ICD-10-CM

## 2020-10-02 DIAGNOSIS — F3341 Major depressive disorder, recurrent, in partial remission: Secondary | ICD-10-CM

## 2020-10-02 LAB — COMPLETE METABOLIC PANEL WITH GFR
AG Ratio: 1.3 (calc) (ref 1.0–2.5)
ALT: 11 U/L (ref 6–29)
AST: 16 U/L (ref 10–35)
Albumin: 3.8 g/dL (ref 3.6–5.1)
Alkaline phosphatase (APISO): 60 U/L (ref 37–153)
BUN: 9 mg/dL (ref 7–25)
CO2: 31 mmol/L (ref 20–32)
Calcium: 9.3 mg/dL (ref 8.6–10.4)
Chloride: 97 mmol/L — ABNORMAL LOW (ref 98–110)
Creat: 0.75 mg/dL (ref 0.60–1.00)
Globulin: 3 g/dL (calc) (ref 1.9–3.7)
Glucose, Bld: 85 mg/dL (ref 65–99)
Potassium: 4.3 mmol/L (ref 3.5–5.3)
Sodium: 133 mmol/L — ABNORMAL LOW (ref 135–146)
Total Bilirubin: 0.5 mg/dL (ref 0.2–1.2)
Total Protein: 6.8 g/dL (ref 6.1–8.1)
eGFR: 81 mL/min/{1.73_m2} (ref >=60)

## 2020-10-02 LAB — CBC WITH DIFFERENTIAL/PLATELET
Absolute Monocytes: 400 cells/uL (ref 200–950)
Basophils Absolute: 19 cells/uL (ref 0–200)
Basophils Relative: 0.6 %
Eosinophils Absolute: 80 cells/uL (ref 15–500)
Eosinophils Relative: 2.5 %
HCT: 38.7 % (ref 35.0–45.0)
Hemoglobin: 12.5 g/dL (ref 11.7–15.5)
Lymphs Abs: 1360 cells/uL (ref 850–3900)
MCH: 30.9 pg (ref 27.0–33.0)
MCHC: 32.3 g/dL (ref 32.0–36.0)
MCV: 95.8 fL (ref 80.0–100.0)
MPV: 11 fL (ref 7.5–12.5)
Monocytes Relative: 12.5 %
Neutro Abs: 1341 cells/uL — ABNORMAL LOW (ref 1500–7800)
Neutrophils Relative %: 41.9 %
Platelets: 164 10*3/uL (ref 140–400)
RBC: 4.04 10*6/uL (ref 3.80–5.10)
RDW: 11.8 % (ref 11.0–15.0)
Total Lymphocyte: 42.5 %
WBC: 3.2 10*3/uL — ABNORMAL LOW (ref 3.8–10.8)

## 2020-10-02 LAB — LIPID PANEL
Cholesterol: 155 mg/dL (ref <200)
HDL: 65 mg/dL (ref >=50)
LDL Cholesterol (Calc): 77 mg/dL (calc)
Non-HDL Cholesterol (Calc): 90 mg/dL (calc) (ref <130)
Total CHOL/HDL Ratio: 2.4 (calc) (ref <5.0)
Triglycerides: 53 mg/dL (ref <150)

## 2020-10-02 LAB — TSH: TSH: 2.02 mIU/L (ref 0.40–4.50)

## 2020-10-02 MED ORDER — ZOSTER VAC RECOMB ADJUVANTED 50 MCG/0.5ML IM SUSR: 0.5000 mL | Freq: Once | INTRAMUSCULAR | 0 refills | Status: AC

## 2020-10-02 MED ORDER — CLONIDINE HCL 0.1 MG PO TABS
0.1000 mg | ORAL_TABLET | Freq: Once | ORAL | Status: AC
  Administered 2020-10-02: 0.1 mg via ORAL

## 2020-10-02 NOTE — Patient Instructions (Addendum)
- keep legs elevated when seated   - Avoid adding extra salt to food to prevent worsening of leg edema  - continue to wear knee high compression stockings on in the morning and off at bedtime   - check weight daily and notify provider for any abrupt weight gain,cough ,shortness of breath or wheezing   [email or web address]">  DASH Eating Plan DASH stands for Dietary Approaches to Stop Hypertension. The DASH eating plan is a healthy eating plan that has been shown to: Reduce high blood pressure (hypertension). Reduce your risk for type 2 diabetes, heart disease, and stroke. Help with weight loss. What are tips for following this plan? Reading food labels Check food labels for the amount of salt (sodium) per serving. Choose foods with less than 5 percent of the Daily Value of sodium. Generally, foods with less than 300 milligrams (mg) of sodium per serving fit into this eating plan. To find whole grains, look for the word "whole" as the first word in the ingredient list. Shopping Buy products labeled as "low-sodium" or "no salt added." Buy fresh foods. Avoid canned foods and pre-made or frozen meals. Cooking Avoid adding salt when cooking. Use salt-free seasonings or herbs instead of table salt or sea salt. Check with your health care provider or pharmacist before using salt substitutes. Do not fry foods. Cook foods using healthy methods such as baking, boiling, grilling, roasting, and broiling instead. Cook with heart-healthy oils, such as olive, canola, avocado, soybean, or sunflower oil. Meal planning  Eat a balanced diet that includes: 4 or more servings of fruits and 4 or more servings of vegetables each day. Try to fill one-half of your plate with fruits and vegetables. 6-8 servings of whole grains each day. Less than 6 oz (170 g) of lean meat, poultry, or fish each day. A 3-oz (85-g) serving of meat is about the same size as a deck of  cards. One egg equals 1 oz (28 g). 2-3 servings of low-fat dairy each day. One serving is 1 cup (237 mL). 1 serving of nuts, seeds, or beans 5 times each week. 2-3 servings of heart-healthy fats. Healthy fats called omega-3 fatty acids are found in foods such as walnuts, flaxseeds, fortified milks, and eggs. These fats are also found in cold-water fish, such as sardines, salmon, and mackerel. Limit how much you eat of: Canned or prepackaged foods. Food that is high in trans fat, such as some fried foods. Food that is high in saturated fat, such as fatty meat. Desserts and other sweets, sugary drinks, and other foods with added sugar. Full-fat dairy products. Do not salt foods before eating. Do not eat more than 4 egg yolks a week. Try to eat at least 2 vegetarian meals a week. Eat more home-cooked food and less restaurant, buffet, and fast food.  Lifestyle When eating at a restaurant, ask that your food be prepared with less salt or no salt, if possible. If you drink alcohol: Limit how much you use to: 0-1 drink a day for women who are not pregnant. 0-2 drinks a day for men. Be aware of how much alcohol is in your drink. In the [place] one drink equals one 12 oz bottle of beer (355 mL), one 5 oz glass of wine (148 mL), or one 1 oz glass of hard liquor (44 mL). General information Avoid eating more than 2,300 mg of salt a day. If you have hypertension, you may need to reduce your sodium intake to 1,500 mg a  day. Work with your health care provider to maintain a healthy body weight or to lose weight. Ask what an ideal weight is for you. Get at least 30 minutes of exercise that causes your heart to beat faster (aerobic exercise) most days of the week. Activities may include walking, swimming, or biking. Work with your health care provider or dietitian to adjust your eating plan to your individual calorie needs. What foods should I eat? Fruits All fresh, dried, or frozen fruit. Canned fruit  in natural juice (without addedsugar). Vegetables Fresh or frozen vegetables (raw, steamed, roasted, or grilled). Low-sodium or reduced-sodium tomato and vegetable juice. Low-sodium or reduced-sodium tomatosauce and tomato paste. Low-sodium or reduced-sodium canned vegetables. Grains Whole-grain or whole-wheat bread. Whole-grain or whole-wheat pasta. Brown rice. [place]. Bulgur. Whole-grain and low-sodium cereals. Pita bread.Low-fat, low-sodium crackers. Whole-wheat flour tortillas. Meats and other proteins Skinless chicken or [place]. Ground chicken or [place]. Pork with fat trimmed off. Fish and seafood. Egg whites. Dried beans, peas, or lentils. Unsalted nuts, nut butters, and seeds. Unsalted canned beans. Lean cuts of beef with fat trimmed off. Low-sodium, lean precooked or cured meat, such as sausages or meatloaves. Dairy Low-fat (1%) or fat-free (skim) milk. Reduced-fat, low-fat, or fat-free cheeses. Nonfat, low-sodium ricotta or cottage cheese. Low-fat or nonfatyogurt. Low-fat, low-sodium cheese. Fats and oils Soft margarine without trans fats. Vegetable oil. Reduced-fat, low-fat, or light mayonnaise and salad dressings (reduced-sodium). Canola, safflower, olive, avocado, soybean, andsunflower oils. Avocado. Seasonings and condiments Herbs. Spices. Seasoning mixes without salt. Other foods Unsalted popcorn and pretzels. Fat-free sweets. The items listed above may not be a complete list of foods and beverages you can eat. Contact a dietitian for more information. What foods should I avoid? Fruits Canned fruit in a light or heavy syrup. Fried fruit. Fruit in cream or buttersauce. Vegetables Creamed or fried vegetables. Vegetables in a cheese sauce. Regular canned vegetables (not low-sodium or reduced-sodium). Regular canned tomato sauce and paste (not low-sodium or reduced-sodium). Regular tomato and vegetable juice(not low-sodium or reduced-sodium). Angie Fava. Olives. Grains Baked  goods made with fat, such as croissants, muffins, or some breads. Drypasta or rice meal packs. Meats and other proteins Fatty cuts of meat. Ribs. Fried meat. Berniece Salines. Bologna, salami, and other precooked or cured meats, such as sausages or meat loaves. Fat from the back of a pig (fatback). Bratwurst. Salted nuts and seeds. Canned beans with added salt. Canned orsmoked fish. Whole eggs or egg yolks. Chicken or [place] with skin. Dairy Whole or 2% milk, cream, and half-and-half. Whole or full-fat cream cheese. Whole-fat or sweetened yogurt. Full-fat cheese. Nondairy creamers. Whippedtoppings. Processed cheese and cheese spreads. Fats and oils Butter. Stick margarine. Lard. Shortening. Ghee. Bacon fat. Tropical oils, suchas coconut, palm kernel, or palm oil. Seasonings and condiments Onion salt, garlic salt, seasoned salt, table salt, and sea salt. Worcestershire sauce. Tartar sauce. Barbecue sauce. Teriyaki sauce. Soy sauce, including reduced-sodium. Steak sauce. Canned and packaged gravies. Fish sauce. Oyster sauce. Cocktail sauce. Store-bought horseradish. Ketchup. Mustard. Meat flavorings and tenderizers. Bouillon cubes. Hot sauces. Pre-made or packaged marinades. Pre-made or packaged taco seasonings. Relishes. Regular saladdressings. Other foods Salted popcorn and pretzels. The items listed above may not be a complete list of foods and beverages you should avoid. Contact a dietitian for more information. Where to find more information National Heart, Lung, and Blood Institute: https://wilson-eaton.com/ American Heart Association: www.heart.org Academy of Nutrition and Dietetics: www.eatright.[place]: www.kidney.org Summary The DASH eating plan is a healthy eating plan that  has been shown to reduce high blood pressure (hypertension). It may also reduce your risk for type 2 diabetes, heart disease, and stroke. When on the DASH eating plan, aim to eat more fresh fruits and vegetables,  whole grains, lean proteins, low-fat dairy, and heart-healthy fats. With the DASH eating plan, you should limit salt (sodium) intake to 2,300 mg a day. If you have hypertension, you may need to reduce your sodium intake to 1,500 mg a day. Work with your health care provider or dietitian to adjust your eating plan to your individual calorie needs. This information is not intended to replace advice given to you by your health care provider. Make sure you discuss any questions you have with your healthcare provider. Document Revised: 01/15/2019 Document Reviewed: 01/15/2019 Elsevier Patient Education  2022 [employer].

## 2020-10-02 NOTE — Progress Notes (Signed)
Provider: Dinah Ngetich FNP-C   Lauree Chandler, NP  Patient Care Team: Lauree Chandler, NP as PCP - General (Geriatric Medicine)  Extended Emergency Contact Information Primary Emergency Contact: [place]' Mobile Phone: [phone] Relation: Daughter Secondary Emergency Contact: Wade,Janice Address: [place], [place] Mobile Phone: [phone] Relation: Daughter  Code Status:  Full Code  Goals of care: Advanced Directive information Advanced Directives 10/02/2020  Does Patient Have a Medical Advance Directive? No  Does patient want to make changes to medical advance directive? -  Would patient like information on creating a medical advance directive? No - Patient declined     Chief Complaint  Patient presents with   Medical Management of Chronic Issues    3 month follow up.   Immunizations    Discuss the need for shingrix vaccine, influenza vaccine, and 2nd covid booster.     HPI:  Pt is a 80 y.o. female seen today for 3 months follow up for medical management of chronic diseases. She denies any acute issues today.states tends to be nervous when coming for office visit B/p elevated on arrival. Home blood pressure readings in the 130's/70's - 130's/80.denies any headache,dizziness,vision changes,fatigue,chest tightness,palpitation,chest pain or shortness of breath.    States previous CR was high has never been high in the past.Her Furosemide was reduced by PCP down to 20 mg tablet daily. Bilateral lower extremities edema has not worsen.Has had no shortness of breath   Bowels have been moving without any difficulties on colace and Senokot. Umbilical hernia has been soft and non-distended since last hospital admission.   Past Medical History:  Diagnosis Date   Abnormality of gait 07/11/2005   Allergy    Anxiety state, unspecified 04/08/2012   Asymptomatic varicose veins 09/24/2011   Cataract     bilateral   Chronic venous insufficiency 09/16/2012   DDD (degenerative disc disease), cervical    Degenerative and vascular disorders of ear, unspecified 08/25/2002   Depressive disorder, not elsewhere classified 11/10/2002   Diverticulosis of colon (without mention of hemorrhage) 08/25/1989   Edema 09/24/2011   Esophageal reflux 11/10/2002   pt denies reflux   First degree atrioventricular block 07/19/2008   Heart murmur    Hypertension    Inguinal hernia without mention of obstruction or gangrene, unilateral or unspecified, (not specified as recurrent) 11/28/2006   Insomnia, unspecified 12/15/2007   Leiomyoma of uterus, unspecified 05/27/2006   Leukocytopenia, unspecified 08/28/2010   Lumbago 07/22/2007   Obesity, unspecified 05/26/1996   Osteoarthrosis, unspecified whether generalized or localized, unspecified site 05/26/2004   Other abnormal blood chemistry 07/19/2008   Other and unspecified hyperlipidemia 10/24/2004   Palpitations 08/29/2007   Peptic ulcer, unspecified site, unspecified as acute or chronic, without mention of hemorrhage, perforation, or obstruction 05/27/2006   Scoliosis (and kyphoscoliosis), idiopathic 05/23/2009   Symptomatic menopausal or female climacteric states 05/28/1990   Unspecified essential hypertension 11/13/2004   Past Surgical History:  Procedure Laterality Date   CHOLECYSTECTOMY  1971   ENDOMETRIAL BIOPSY  03/1992   Dr. Mallie Mussel   FOOT SURGERY Left 04/2003   [facility]- Dr. Payton Mccallum HERNIA REPAIR Right 03/2007   Dr. Georgette Dover   KNEE ARTHROSCOPY Left 07/1997   Dr. Wonda Olds   KNEE SURGERY Right 03/02/2004   Dr. Berenice Primas   OOPHORECTOMY Right 1974   Dr. Amalia Hailey   THUMB ARTHROSCOPY Right 1995   Dr.Sypher   TONSILLECTOMY AND ADENOIDECTOMY  1975   TUBAL LIGATION     VESICOVAGINAL FISTULA CLOSURE W/ TAH  1975    Allergies  Allergen Reactions   Caffeine Other (See Comments)    Rapid heart rate    Demerol [Meperidine]    Venlafaxine  Hcl Other (See Comments)    Hyper   Pseudoephedrine Palpitations    Allergies as of 10/02/2020       Reactions   Caffeine Other (See Comments)   Rapid heart rate    Demerol [meperidine]    Venlafaxine Hcl Other (See Comments)   Hyper   Pseudoephedrine Palpitations        Medication List        Accurate as of October 02, 2020 10:12 AM. If you have any questions, ask your nurse or doctor.          aspirin 81 MG tablet Take 81 mg by mouth at bedtime.   cholecalciferol 1000 units tablet Commonly known as: VITAMIN D Take 1,000 Units by mouth daily.   cloNIDine 0.1 MG tablet Commonly known as: CATAPRES TAKE 1 TABLET BY MOUTH TWICE A DAY TO CONTROL BLOOD PRESSURE   clorazepate 7.5 MG tablet Commonly known as: TRANXENE TAKE 1 TABLET (7.5 MG TOTAL) BY MOUTH 2 (TWO) TIMES DAILY.   docusate sodium 100 MG capsule Commonly known as: COLACE Take 100 mg by mouth daily.   fexofenadine 180 MG tablet Commonly known as: ALLEGRA Take 180 mg by mouth daily as needed for allergies.   furosemide 20 MG tablet Commonly known as: LASIX Take 1 tablet (20 mg total) by mouth daily.   GARLIC PO Take 1 tablet by mouth daily.   losartan 100 MG tablet Commonly known as: COZAAR TAKE 1 TABLET BY MOUTH EVERY DAY FOR BLOOD PRESSURE   sennosides-docusate sodium 8.6-50 MG tablet Commonly known as: SENOKOT-S Take 1 tablet by mouth 2 (two) times daily.   simvastatin 40 MG tablet Commonly known as: ZOCOR TAKE 1 TABLET BY MOUTH EVERY DAY TO LOWER CHOLESTEROL   Zoster Vaccine Adjuvanted injection Commonly known as: SHINGRIX Inject 0.5 mLs into the muscle once for 1 dose.        Review of Systems  Constitutional:  Negative for appetite change, chills, fatigue, fever and unexpected weight change.  HENT:  Negative for congestion, dental problem, ear discharge, ear pain, facial swelling, hearing loss, nosebleeds, postnasal drip, rhinorrhea, sinus pressure, sinus pain, sneezing, sore  throat, tinnitus and trouble swallowing.   Eyes:  Positive for visual disturbance. Negative for pain, discharge, redness and itching.       Follows up with Ophthalmology hx cataract surgery on left eye plan cataract removal for right eye in September,2022  Respiratory:  Negative for cough, chest tightness, shortness of breath and wheezing.   Cardiovascular:  Negative for chest pain, palpitations and leg swelling.  Gastrointestinal:  Negative for abdominal distention, abdominal pain, blood in stool, constipation, diarrhea, nausea and vomiting.  Endocrine: Negative for cold intolerance, heat intolerance, polydipsia, polyphagia and polyuria.  Genitourinary:  Negative for difficulty urinating, dysuria, flank pain, frequency and urgency.  Musculoskeletal:  Negative for arthralgias, back pain, gait problem, joint swelling, myalgias, neck pain and neck stiffness.  Skin:  Negative for color change, pallor, rash and wound.  Neurological:  Negative for dizziness, syncope, speech difficulty, weakness, light-headedness, numbness and headaches.  Hematological:  Does not bruise/bleed easily.  Psychiatric/Behavioral:  Negative for agitation, behavioral problems, confusion, hallucinations, self-injury, sleep disturbance and suicidal ideas. The patient is nervous/anxious.  Depression for years    Immunization History  Administered Date(s) Administered   Fluad Quad(high Dose 65+) 11/25/2018   Influenza, High Dose Seasonal PF 11/26/2016, 11/21/2017, 11/30/2019   Influenza-Unspecified 11/29/2009, 12/02/2012, 11/27/2013, 11/29/2014, 11/26/2015   PFIZER(Purple Top)SARS-COV-2 Vaccination 05/01/2019, 05/22/2019, 01/08/2020   Pneumococcal Conjugate-13 04/16/2002, 11/04/2016   Pneumococcal Polysaccharide-23 05/07/2018   Td 01/16/1993   Tdap 03/06/2011   Zoster, Live 11/25/2013   Pertinent  Health Maintenance Due  Topic Date Due   INFLUENZA VACCINE  09/25/2020   DEXA SCAN  Completed   PNA vac Low Risk  Adult  Completed   Fall Risk  10/02/2020 06/16/2020 12/02/2019 05/31/2019 05/10/2019  Falls in the past year? 0 0 0 0 0  Number falls in past yr: 0 0 0 0 0  Injury with Fall? 0 0 0 0 0  Risk for fall due to : No Fall Risks - - - -  Follow up Falls evaluation completed - - - -   Functional Status Survey:    Vitals:   10/02/20 0903 10/02/20 1005  BP: (!) 172/110 (!) 160/90  Pulse: 77   Resp: 16   Temp: (!) 96.9 F (36.1 C)   SpO2: 91%   Weight: 142 lb (64.4 kg)   Height: 5' 1" (1.549 m)    Body mass index is 26.83 kg/m. Physical Exam Vitals reviewed.  Constitutional:      General: She is not in acute distress.    Appearance: Normal appearance. She is overweight. She is not ill-appearing or diaphoretic.  HENT:     Head: Normocephalic.     Right Ear: Tympanic membrane, ear canal and external ear normal. There is no impacted cerumen.     Left Ear: Tympanic membrane, ear canal and external ear normal. There is no impacted cerumen.     Nose: Nose normal. No congestion or rhinorrhea.     Mouth/Throat:     Mouth: Mucous membranes are moist.     Pharynx: Oropharynx is clear. No oropharyngeal exudate or posterior oropharyngeal erythema.  Eyes:     General: No scleral icterus.       Right eye: No discharge.        Left eye: No discharge.     Extraocular Movements: Extraocular movements intact.     Conjunctiva/sclera: Conjunctivae normal.     Pupils: Pupils are equal, round, and reactive to light.     Comments: Corrective lens in place   Neck:     Vascular: No carotid bruit.  Cardiovascular:     Rate and Rhythm: Normal rate and regular rhythm.     Pulses: Normal pulses.     Heart sounds: Normal heart sounds. No murmur heard.   No friction rub. No gallop.  Pulmonary:     Effort: Pulmonary effort is normal. No respiratory distress.     Breath sounds: Normal breath sounds. No wheezing, rhonchi or rales.  Chest:     Chest wall: No tenderness.  Abdominal:     General: Bowel sounds  are normal. There is no distension.     Palpations: Abdomen is soft. There is no mass.     Tenderness: There is abdominal tenderness. There is no right CVA tenderness, left CVA tenderness, guarding or rebound.     Hernia: A hernia is present. Hernia is present in the umbilical area.  Musculoskeletal:        General: No swelling or tenderness. Normal range of motion.     Cervical back: Normal range of motion.  No rigidity or tenderness.     Right lower leg: Edema present.     Left lower leg: Edema present.     Comments: Knee high ted hose in place   Lymphadenopathy:     Cervical: No cervical adenopathy.  Skin:    General: Skin is warm and dry.     Coloration: Skin is not pale.     Findings: No bruising, erythema, lesion or rash.  Neurological:     Mental Status: She is alert and oriented to person, place, and time.     Cranial Nerves: No cranial nerve deficit.     Sensory: No sensory deficit.     Motor: No weakness.     Coordination: Coordination normal.     Gait: Gait normal.  Psychiatric:        Mood and Affect: Mood normal.        Speech: Speech normal.        Behavior: Behavior normal.        Thought Content: Thought content normal.        Judgment: Judgment normal.    Labs reviewed: Recent Labs    06/02/20 1116 06/03/20 0100 06/04/20 0450 06/06/20 0639 06/16/20 1119  NA 128*   < > 134* 137 138  K 3.4*   < > 3.7 3.7 3.7  CL 80*   < > 94* 99 98  CO2 35*   < > 33* 34* 31  GLUCOSE 130*   < > 115* 117* 106  BUN 19   < > 13 5* 12  CREATININE 1.20*   < > 0.87 0.75 1.01*  CALCIUM 10.4*   < > 9.1 8.2* 9.0  MG 1.7  --  1.6* 1.8  --   PHOS 5.0*  --   --   --   --    < > = values in this interval not displayed.   Recent Labs    12/02/19 1423 06/02/20 1116 06/16/20 1119  AST _0 ALT _1 ALKPHOS  --  63  --   BILITOT 0.5 1.3* 0.4  PROT 6.4 7.2 6.7  ALBUMIN  --  3.9  --    Recent Labs    12/02/19 1423 06/02/20 1116 06/04/20 0450 06/06/20 0639  06/16/20 1119  WBC 7.2 8.6 5.9 5.6 5.3  NEUTROABS 4,558 6.6  --   --  3,408  HGB 15.1 14.0 12.7 11.2* 11.9  HCT 44.9 40.4 37.5 33.7* 37.3  MCV 87.7 91.2 92.6 96.3 95.2  PLT 237 211 173 174 315   No results found for: TSH Lab Results  Component Value Date   HGBA1C 5.3 12/02/2019   Lab Results  Component Value Date   CHOL 145 12/02/2019   HDL 54 12/02/2019   LDLCALC 77 12/02/2019   TRIG 55 12/02/2019   CHOLHDL 2.7 12/02/2019    Significant Diagnostic Results in last 30 days:  No results found.  Assessment/Plan 1. Need for shingles vaccine Advised to get her shingrix vaccine at the pharmacy. - Zoster Vaccine Adjuvanted [facility]) injection; Inject 0.5 mLs into the muscle once for 1 dose.  Dispense: 0.5 mL; Refill: 0  2. Essential hypertension B/p elevated on arrival but reports feeling nervous coming to the office.Home readings in the 130's/70's- 80's.  Clonidine given B/p recheck improved.  - continue on losartan ,clonidine and furosemide  - on ASA and Simvastatin for cardiovascular event prevention.  - Advised to check Blood pressure at home and record on  log provided and notify provider if B/p > 140/90  - CBC with Differential/Platelet - TSH - CMP with eGFR([facility]) - cloNIDine (CATAPRES) tablet 0.1 mg  3. Hyperlipidemia, unspecified hyperlipidemia type Previous LDL at goal  Continue on simvastatin  - Lipid Panel  4. PVD (peripheral vascular disease) (HCC) No ulceration noted. - continue to control high risk factors.   5. Slow transit constipation Docusate and senokot-s effective  - encouraged to increase fiber in diet  - increase water intake to 6-8 glasses of water daily and exercise as tolerated   6. Edema of both lower extremities No signs of fluid overload - encouraged to keep legs elevated when seated  - Avoid adding extra salt to food  - knee high compression stockings on in the morning and off at bedtime  - check weight daily and notify provider for  any abrupt weight gain,cough ,shortness of breath or wheezing  - continue on Furosemide   7. Umbilical hernia without obstruction and without gangrene Umbilical hernia soft and reducible - advised to continue to monitor for signs of incarceration  8. Overweight with body mass index (BMI) 25.0-29.9 BMI 26.83  - continue with walking exercises  - continue on heart healthy diet   9. Body mass index 26.0-26.9, adult BMI 26.83  Dietary modification and exercise as above   10. Recurrent major depressive disorder, in partial remission (HCC) Mood stable  Continue on clorazepate    Family/ staff Communication: Reviewed plan of care with patient verbalized understanding   Labs/tests ordered:  - CBC with Differential/Platelet - CMP with eGFR([facility]) - TSH - Lipid panel  Next Appointment : 6 months for medical management of chronic issues.  Sandrea Hughs, NP

## 2020-11-04 ENCOUNTER — Other Ambulatory Visit: Payer: Self-pay | Admitting: Nurse Practitioner

## 2020-11-04 DIAGNOSIS — I10 Essential (primary) hypertension: Secondary | ICD-10-CM

## 2020-11-16 ENCOUNTER — Other Ambulatory Visit: Payer: Self-pay | Admitting: Nurse Practitioner

## 2020-11-16 DIAGNOSIS — I10 Essential (primary) hypertension: Secondary | ICD-10-CM

## 2020-11-16 DIAGNOSIS — F418 Other specified anxiety disorders: Secondary | ICD-10-CM

## 2020-11-16 MED ORDER — CLORAZEPATE DIPOTASSIUM 7.5 MG PO TABS: 7.5000 mg | ORAL_TABLET | Freq: Two times a day (BID) | ORAL | 5 refills | Status: DC

## 2020-11-16 NOTE — Telephone Encounter (Signed)
Pharmacy requested refills.  Pended Rx and sent to [facility] for approval.

## 2020-11-17 NOTE — Telephone Encounter (Signed)
Patient states she was told by the pharmacy a fax was sent to [place] and they no longer make the Cloracepate 7.5 and will need a substitution.

## 2020-11-18 ENCOUNTER — Other Ambulatory Visit: Payer: Self-pay | Admitting: Nurse Practitioner

## 2020-11-18 DIAGNOSIS — F418 Other specified anxiety disorders: Secondary | ICD-10-CM

## 2020-11-18 MED ORDER — CLORAZEPATE DIPOTASSIUM 7.5 MG PO TABS: 7.5000 mg | ORAL_TABLET | Freq: Two times a day (BID) | ORAL | 0 refills | Status: DC

## 2020-12-17 ENCOUNTER — Other Ambulatory Visit: Payer: Self-pay | Admitting: Nurse Practitioner

## 2020-12-17 DIAGNOSIS — F418 Other specified anxiety disorders: Secondary | ICD-10-CM

## 2020-12-18 NOTE — Telephone Encounter (Signed)
Patient has request refill on medication "Clorazepate". Medication was last refilled on 11/18/2020. Patient doesn't have Non opioid contract on file. Medication pend and sent to PCP Dewaine Oats Carlos American, NP for approval. Please Advise.

## 2021-04-09 ENCOUNTER — Encounter: Payer: Self-pay | Admitting: Nurse Practitioner

## 2021-04-09 ENCOUNTER — Ambulatory Visit (INDEPENDENT_AMBULATORY_CARE_PROVIDER_SITE_OTHER): Payer: Medicare HMO | Admitting: Nurse Practitioner

## 2021-04-09 ENCOUNTER — Other Ambulatory Visit: Payer: Self-pay

## 2021-04-09 VITALS — BP 143/80 | HR 72 | Temp 97.0°F | Ht 61.0 in | Wt 142.8 lb

## 2021-04-09 DIAGNOSIS — I739 Peripheral vascular disease, unspecified: Secondary | ICD-10-CM | POA: Diagnosis not present

## 2021-04-09 DIAGNOSIS — F17200 Nicotine dependence, unspecified, uncomplicated: Secondary | ICD-10-CM

## 2021-04-09 DIAGNOSIS — K5901 Slow transit constipation: Secondary | ICD-10-CM

## 2021-04-09 DIAGNOSIS — I10 Essential (primary) hypertension: Secondary | ICD-10-CM

## 2021-04-09 DIAGNOSIS — F3341 Major depressive disorder, recurrent, in partial remission: Secondary | ICD-10-CM

## 2021-04-09 DIAGNOSIS — R600 Localized edema: Secondary | ICD-10-CM

## 2021-04-09 DIAGNOSIS — N2889 Other specified disorders of kidney and ureter: Secondary | ICD-10-CM

## 2021-04-09 DIAGNOSIS — K429 Umbilical hernia without obstruction or gangrene: Secondary | ICD-10-CM

## 2021-04-09 DIAGNOSIS — F411 Generalized anxiety disorder: Secondary | ICD-10-CM

## 2021-04-09 DIAGNOSIS — M8589 Other specified disorders of bone density and structure, multiple sites: Secondary | ICD-10-CM

## 2021-04-09 DIAGNOSIS — E785 Hyperlipidemia, unspecified: Secondary | ICD-10-CM

## 2021-04-09 DIAGNOSIS — E2839 Other primary ovarian failure: Secondary | ICD-10-CM

## 2021-04-09 LAB — CBC WITH DIFFERENTIAL/PLATELET
Absolute Monocytes: 459 cells/uL (ref 200–950)
Basophils Absolute: 30 cells/uL (ref 0–200)
Basophils Relative: 0.9 %
Eosinophils Absolute: 149 cells/uL (ref 15–500)
Eosinophils Relative: 4.5 %
HCT: 36.9 % (ref 35.0–45.0)
Hemoglobin: 12.3 g/dL (ref 11.7–15.5)
Lymphs Abs: 1307 cells/uL (ref 850–3900)
MCH: 32 pg (ref 27.0–33.0)
MCHC: 33.3 g/dL (ref 32.0–36.0)
MCV: 96.1 fL (ref 80.0–100.0)
MPV: 11.2 fL (ref 7.5–12.5)
Monocytes Relative: 13.9 %
Neutro Abs: 1356 cells/uL — ABNORMAL LOW (ref 1500–7800)
Neutrophils Relative %: 41.1 %
Platelets: 154 10*3/uL (ref 140–400)
RBC: 3.84 10*6/uL (ref 3.80–5.10)
RDW: 11.2 % (ref 11.0–15.0)
Total Lymphocyte: 39.6 %
WBC: 3.3 10*3/uL — ABNORMAL LOW (ref 3.8–10.8)

## 2021-04-09 LAB — COMPLETE METABOLIC PANEL WITH GFR
AG Ratio: 1.3 (calc) (ref 1.0–2.5)
ALT: 12 U/L (ref 6–29)
AST: 15 U/L (ref 10–35)
Albumin: 3.5 g/dL — ABNORMAL LOW (ref 3.6–5.1)
Alkaline phosphatase (APISO): 61 U/L (ref 37–153)
BUN: 10 mg/dL (ref 7–25)
CO2: 33 mmol/L — ABNORMAL HIGH (ref 20–32)
Calcium: 9.1 mg/dL (ref 8.6–10.4)
Chloride: 100 mmol/L (ref 98–110)
Creat: 0.86 mg/dL (ref 0.60–1.00)
Globulin: 2.7 g/dL (calc) (ref 1.9–3.7)
Glucose, Bld: 63 mg/dL — ABNORMAL LOW (ref 65–139)
Potassium: 4.2 mmol/L (ref 3.5–5.3)
Sodium: 136 mmol/L (ref 135–146)
Total Bilirubin: 0.4 mg/dL (ref 0.2–1.2)
Total Protein: 6.2 g/dL (ref 6.1–8.1)
eGFR: 69 mL/min/{1.73_m2} (ref >=60)

## 2021-04-09 NOTE — Patient Instructions (Signed)
Okay to take Vit C 500 mg daily   Recommended to take calcium 600 mg twice daily with Vitamin D 2000 units daily and weight bearing activity 30 mins/5 days a week

## 2021-04-09 NOTE — Progress Notes (Signed)
Careteam: Patient Care Team: Sharon Seller, NP as PCP - General (Geriatric Medicine)  PLACE OF SERVICE:  [facility]  Advanced Directive information Does Patient Have a Medical Advance Directive?: No  Allergies  Allergen Reactions   Caffeine Other (See Comments)    Rapid heart rate    Demerol [Meperidine]    Venlafaxine Hcl Other (See Comments)    Hyper   Pseudoephedrine Palpitations    Chief Complaint  Patient presents with   Medical Management of Chronic Issues    6 month follow up.Patient received flu vaccine at [facility].   Health Maintenance    Discuss the need for shingrix vaccine, flu vaccine and tetnus/tdap vaccine     HPI: Patient is a 81 y.o. female for follow up   Htn- 130/80 at home. Sometimes dbp is in the 50s  Reports white coat syndrome. On losartan, clonidine 0.1 mg twice daily as well as lasix   LE edema- well controlled with lasix.    Constipation- well controlled on colace and senna    Hyperlipidemia- LDL at goal on simvastatin  Renal lesion noted on CT while she was in hospital in April 2022. At the time did not wish to have further imaging but agreeable at this time  Reports more loneliness vs depression or anxiety at this time  Has cut back on smoking, will have 1 pack last her 3 days.   Review of Systems:  Review of Systems  Constitutional:  Negative for chills, fever and weight loss.  HENT:  Negative for tinnitus.   Respiratory:  Negative for cough, sputum production and shortness of breath.   Cardiovascular:  Negative for chest pain, palpitations and leg swelling.  Gastrointestinal:  Negative for abdominal pain, constipation, diarrhea and heartburn.  Genitourinary:  Negative for dysuria, frequency and urgency.  Musculoskeletal:  Negative for back pain, falls, joint pain and myalgias.  Skin: Negative.   Neurological:  Negative for dizziness and headaches.  Psychiatric/Behavioral:  Negative for depression and memory loss. The  patient does not have insomnia.    Past Medical History:  Diagnosis Date   Abnormality of gait 07/11/2005   Allergy    Anxiety state, unspecified 04/08/2012   Asymptomatic varicose veins 09/24/2011   Cataract    bilateral   Chronic venous insufficiency 09/16/2012   DDD (degenerative disc disease), cervical    Degenerative and vascular disorders of ear, unspecified 08/25/2002   Depressive disorder, not elsewhere classified 11/10/2002   Diverticulosis of colon (without mention of hemorrhage) 08/25/1989   Edema 09/24/2011   Esophageal reflux 11/10/2002   pt denies reflux   First degree atrioventricular block 07/19/2008   Heart murmur    Hypertension    Inguinal hernia without mention of obstruction or gangrene, unilateral or unspecified, (not specified as recurrent) 11/28/2006   Insomnia, unspecified 12/15/2007   Leiomyoma of uterus, unspecified 05/27/2006   Leukocytopenia, unspecified 08/28/2010   Lumbago 07/22/2007   Obesity, unspecified 05/26/1996   Osteoarthrosis, unspecified whether generalized or localized, unspecified site 05/26/2004   Other abnormal blood chemistry 07/19/2008   Other and unspecified hyperlipidemia 10/24/2004   Palpitations 08/29/2007   Peptic ulcer, unspecified site, unspecified as acute or chronic, without mention of hemorrhage, perforation, or obstruction 05/27/2006   Scoliosis (and kyphoscoliosis), idiopathic 05/23/2009   Symptomatic menopausal or female climacteric states 05/28/1990   Unspecified essential hypertension 11/13/2004   Past Surgical History:  Procedure Laterality Date   CHOLECYSTECTOMY  1971   ENDOMETRIAL BIOPSY  03/1992   Dr. Dewaine Conger  FOOT SURGERY Left 04/2003   [facility]- Dr. Payton Mccallum HERNIA REPAIR Right 03/2007   Dr. Georgette Dover   KNEE ARTHROSCOPY Left 07/1997   Dr. Wonda Olds   KNEE SURGERY Right 03/02/2004   Dr. Berenice Primas   OOPHORECTOMY Right 1974   Dr. Amalia Hailey   THUMB ARTHROSCOPY Right 1995   Dr.Sypher   TONSILLECTOMY AND  ADENOIDECTOMY  1975   TUBAL LIGATION     VESICOVAGINAL FISTULA CLOSURE W/ TAH  1975   Social History:   reports that she has been smoking cigarettes. She has a 15.00 pack-year smoking history. She has never used smokeless tobacco. She reports that she does not drink alcohol and does not use drugs.  Family History  Problem Relation Age of Onset   Kidney disease Mother    Hypertension Mother    Heart disease Father    Diabetes Father    Hypertension Sister    Atrial fibrillation Daughter    CVA Daughter    Drug abuse Son    Osteoporosis Daughter    Hypertension Daughter    Colon cancer Neg Hx    Colon polyps Neg Hx    Rectal cancer Neg Hx    Stomach cancer Neg Hx    Esophageal cancer Neg Hx     Medications: Patient's Medications  New Prescriptions   No medications on file  Previous Medications   ASCORBIC ACID (VITAMIN C PO)    Take 500 mg by mouth daily. 1/2 tablet twice a day   ASPIRIN 81 MG TABLET    Take 81 mg by mouth at bedtime.   CHOLECALCIFEROL (VITAMIN D) 1000 UNITS TABLET    Take 1,000 Units by mouth daily.   CLONIDINE (CATAPRES) 0.1 MG TABLET    TAKE 1 TABLET BY MOUTH TWICE A DAY TO CONTROL BLOOD PRESSURE   CLORAZEPATE (TRANXENE) 7.5 MG TABLET    TAKE 1 TABLET(7.5 MG) BY MOUTH TWICE DAILY   DOCUSATE SODIUM (COLACE) 100 MG CAPSULE    Take 100 mg by mouth daily.   FEXOFENADINE (ALLEGRA) 180 MG TABLET    Take 180 mg by mouth daily as needed for allergies.   FUROSEMIDE (LASIX) 20 MG TABLET    Take 1 tablet (20 mg total) by mouth daily.   LOSARTAN (COZAAR) 100 MG TABLET    TAKE 1 TABLET BY MOUTH EVERY DAY FOR BLOOD PRESSURE   SENNOSIDES-DOCUSATE SODIUM (SENOKOT-S) 8.6-50 MG TABLET    Take 1 tablet by mouth 2 (two) times daily.   SIMVASTATIN (ZOCOR) 40 MG TABLET    TAKE 1 TABLET BY MOUTH EVERY DAY TO LOWER CHOLESTEROL  Modified Medications   No medications on file  Discontinued Medications   GARLIC PO    Take 1 tablet by mouth daily.     Physical Exam:  Vitals:    04/09/21 1011  BP: (!) 143/80  Pulse: 72  Temp: (!) 97 F (36.1 C)  SpO2: 98%  Weight: 142 lb 12.8 oz (64.8 kg)  Height: _0  (1.549 m)   Body mass index is 26.98 kg/m. Wt Readings from Last 3 Encounters:  04/09/21 142 lb 12.8 oz (64.8 kg)  10/02/20 142 lb (64.4 kg)  06/16/20 140 lb (63.5 kg)    Physical Exam Constitutional:      General: She is not in acute distress.    Appearance: She is well-developed. She is not diaphoretic.  HENT:     Head: Normocephalic and atraumatic.     Mouth/Throat:     Pharynx: No  oropharyngeal exudate.  Eyes:     Conjunctiva/sclera: Conjunctivae normal.     Pupils: Pupils are equal, round, and reactive to light.  Cardiovascular:     Rate and Rhythm: Normal rate and regular rhythm.     Heart sounds: Normal heart sounds.  Pulmonary:     Effort: Pulmonary effort is normal.     Breath sounds: Normal breath sounds.  Abdominal:     General: Bowel sounds are normal.     Palpations: Abdomen is soft.  Musculoskeletal:     Cervical back: Normal range of motion and neck supple.     Right lower leg: No edema.     Left lower leg: No edema.  Skin:    General: Skin is warm and dry.  Neurological:     Mental Status: She is alert and oriented to person, place, and time. Mental status is at baseline.  Psychiatric:        Mood and Affect: Mood normal.    Labs reviewed: Basic Metabolic Panel: Recent Labs    06/02/20 1116 06/03/20 0100 06/04/20 0450 06/06/20 0639 06/16/20 1119 10/02/20 1013  NA 128*   < > 134* 137 138 133*  K 3.4*   < > 3.7 3.7 3.7 4.3  CL 80*   < > 94* 99 98 97*  CO2 35*   < > 33* 34* 31 31  GLUCOSE 130*   < > 115* 117* 106 85  BUN 19   < > 13 5* 12 9  CREATININE 1.20*   < > 0.87 0.75 1.01* 0.75  CALCIUM 10.4*   < > 9.1 8.2* 9.0 9.3  MG 1.7  --  1.6* 1.8  --   --   PHOS 5.0*  --   --   --   --   --   TSH  --   --   --   --   --  2.02   < > = values in this interval not displayed.   Liver Function Tests: Recent Labs     06/02/20 1116 06/16/20 1119 10/02/20 1013  AST _0 ALT _1 ALKPHOS 63  --   --   BILITOT 1.3* 0.4 0.5  PROT 7.2 6.7 6.8  ALBUMIN 3.9  --   --    Recent Labs    06/02/20 1116  LIPASE 42   No results for input(s): AMMONIA in the last 8760 hours. CBC: Recent Labs    06/02/20 1116 06/04/20 0450 06/06/20 0639 06/16/20 1119 10/02/20 1013  WBC 8.6   < > 5.6 5.3 3.2*  NEUTROABS 6.6  --   --  3,408 1,341*  HGB 14.0   < > 11.2* 11.9 12.5  HCT 40.4   < > 33.7* 37.3 38.7  MCV 91.2   < > 96.3 95.2 95.8  PLT 211   < > 174 315 164   < > = values in this interval not displayed.   Lipid Panel: Recent Labs    10/02/20 1013  CHOL 155  HDL 65  LDLCALC 77  TRIG 53  CHOLHDL 2.4   TSH: Recent Labs    10/02/20 1013  TSH 2.02   A1C: Lab Results  Component Value Date   HGBA1C 5.3 12/02/2019     Assessment/Plan 1. PVD (peripheral vascular disease) (HCC) Stable, continues on ASA 81 mg daily, no symptoms of worsening disease.   2. Recurrent major depressive disorder, in partial remission ([place]) -stable, continues with lifestyle modifications.  3. Essential hypertension -Blood pressure elevated today -home blood pressures are well controlled -No changes to medications today  -will have pt continue to monitor home bp goal <140/90, to notify if readings remain high on 3 different days  -follow metabolic panel - CMP with eGFR([facility]) - CBC with Differential/Platelet  4. Osteopenia of multiple sites -Recommended to take calcium 600 mg twice daily with Vitamin D 2000 units daily and weight bearing activity 30 mins/5 days a week -bone density ordered for follow up  5. Slow transit constipation Well controlled on current regimen.   6. Anxiety state -stable on tranxene  7. Hyperlipidemia, unspecified hyperlipidemia type -stable on zocor, continue dietary modifications.   8. Tobacco use disorder Continue to work on cessation.   9. Edema of both lower  extremities -controlled with lasix - CMP with eGFR([facility])  10. Umbilical hernia without obstruction and without gangrene -easily reducible, without pain, continue to monitor.   11. Estrogen deficiency - DG Bone Density; Future   Return in about 6 months (around 10/07/2021) for routine follow up. Carlos American. [place], [facility] [phone]

## 2021-04-19 ENCOUNTER — Other Ambulatory Visit: Payer: Self-pay | Admitting: *Deleted

## 2021-04-19 DIAGNOSIS — N179 Acute kidney failure, unspecified: Secondary | ICD-10-CM

## 2021-04-19 DIAGNOSIS — I10 Essential (primary) hypertension: Secondary | ICD-10-CM

## 2021-04-19 DIAGNOSIS — R600 Localized edema: Secondary | ICD-10-CM

## 2021-04-19 MED ORDER — FUROSEMIDE 20 MG PO TABS: 20.0000 mg | ORAL_TABLET | Freq: Every day | ORAL | 1 refills | Status: DC

## 2021-04-19 NOTE — Telephone Encounter (Signed)
Patient requested refill.  Patient now uses [employer].

## 2021-05-07 ENCOUNTER — Other Ambulatory Visit: Payer: Self-pay | Admitting: *Deleted

## 2021-05-07 ENCOUNTER — Other Ambulatory Visit: Payer: Self-pay | Admitting: Nurse Practitioner

## 2021-05-07 DIAGNOSIS — I10 Essential (primary) hypertension: Secondary | ICD-10-CM

## 2021-05-07 MED ORDER — SIMVASTATIN 40 MG PO TABS: ORAL_TABLET | ORAL | 1 refills | Status: DC

## 2021-05-07 NOTE — Telephone Encounter (Signed)
Patient medication Clonidine has Warnings. Medication Simvastatin already refilled today 05/07/2021. Medication pend and sent to PCP Dewaine Oats Carlos American, NP for approval.  ?

## 2021-05-07 NOTE — Telephone Encounter (Signed)
Patient requested refill

## 2021-05-13 ENCOUNTER — Ambulatory Visit
Admission: RE | Admit: 2021-05-13 | Discharge: 2021-05-13 | Disposition: A | Discharge status: Home / Self Care | Payer: Medicare HMO | Source: Ambulatory Visit | Attending: Nurse Practitioner | Admitting: Nurse Practitioner

## 2021-05-13 ENCOUNTER — Other Ambulatory Visit: Payer: Self-pay

## 2021-05-13 DIAGNOSIS — N2889 Other specified disorders of kidney and ureter: Secondary | ICD-10-CM

## 2021-05-13 MED ORDER — GADOBENATE DIMEGLUMINE 529 MG/ML IV SOLN
12.0000 mL | Freq: Once | INTRAVENOUS | Status: AC | PRN
  Administered 2021-05-13: 12 mL via INTRAVENOUS

## 2021-05-15 ENCOUNTER — Other Ambulatory Visit: Payer: Self-pay

## 2021-05-15 ENCOUNTER — Ambulatory Visit (INDEPENDENT_AMBULATORY_CARE_PROVIDER_SITE_OTHER): Payer: Medicare HMO | Admitting: Nurse Practitioner

## 2021-05-15 ENCOUNTER — Telehealth: Payer: Self-pay

## 2021-05-15 ENCOUNTER — Encounter: Payer: Self-pay | Admitting: Nurse Practitioner

## 2021-05-15 DIAGNOSIS — Z0000 Encounter for general adult medical examination without abnormal findings: Secondary | ICD-10-CM | POA: Diagnosis not present

## 2021-05-15 NOTE — Patient Instructions (Signed)
Ms. Reaney , ?Thank you for taking time to come for your Medicare Wellness Visit. I appreciate your ongoing commitment to your health goals. Please review the following plan we discussed and let me know if I can assist you in the future.  ? ?Screening recommendations/referrals: ?Colonoscopy up to date ?Mammogram aged out ?Bone Density scheduled ?Recommended yearly ophthalmology/optometry visit for glaucoma screening and checkup ?Recommended yearly dental visit for hygiene and checkup ? ?Vaccinations: ?Influenza vaccine up to date ?Pneumococcal vaccine up to date ?Tdap vaccine DUE- recommend to get at your local pharmacy    ?Shingles vaccine DUE- recommend to get at your local pharmacy      ? ?Advanced directives: recommended to complete and bring back to office  ? ?Conditions/risks identified: advance age.  ? ?Next appointment: yearly  ? ? ?Preventive Care 58 Years and Older, Female ?Preventive care refers to lifestyle choices and visits with your health care provider that can promote health and wellness. ?What does preventive care include? ?A yearly physical exam. This is also called an annual well check. ?Dental exams once or twice a year. ?Routine eye exams. Ask your health care provider how often you should have your eyes checked. ?Personal lifestyle choices, including: ?Daily care of your teeth and gums. ?Regular physical activity. ?Eating a healthy diet. ?Avoiding tobacco and drug use. ?Limiting alcohol use. ?Practicing safe sex. ?Taking low-dose aspirin every day. ?Taking vitamin and mineral supplements as recommended by your health care provider. ?What happens during an annual well check? ?The services and screenings done by your health care provider during your annual well check will depend on your age, overall health, lifestyle risk factors, and family history of disease. ?Counseling  ?Your health care provider may ask you questions about your: ?Alcohol use. ?Tobacco use. ?Drug use. ?Emotional  well-being. ?Home and relationship well-being. ?Sexual activity. ?Eating habits. ?History of falls. ?Memory and ability to understand (cognition). ?Work and work Statistician. ?Reproductive health. ?Screening  ?You may have the following tests or measurements: ?Height, weight, and BMI. ?Blood pressure. ?Lipid and cholesterol levels. These may be checked every 5 years, or more frequently if you are over 58 years old. ?Skin check. ?Lung cancer screening. You may have this screening every year starting at age 76 if you have a 30-pack-year history of smoking and currently smoke or have quit within the past 15 years. ?Fecal occult blood test (FOBT) of the stool. You may have this test every year starting at age 59. ?Flexible sigmoidoscopy or colonoscopy. You may have a sigmoidoscopy every 5 years or a colonoscopy every 10 years starting at age 59. ?Hepatitis C blood test. ?Hepatitis B blood test. ?Sexually transmitted disease (STD) testing. ?Diabetes screening. This is done by checking your blood sugar (glucose) after you have not eaten for a while (fasting). You may have this done every 1-3 years. ?Bone density scan. This is done to screen for osteoporosis. You may have this done starting at age 78. ?Mammogram. This may be done every 1-2 years. Talk to your health care provider about how often you should have regular mammograms. ?Talk with your health care provider about your test results, treatment options, and if necessary, the need for more tests. ?Vaccines  ?Your health care provider may recommend certain vaccines, such as: ?Influenza vaccine. This is recommended every year. ?Tetanus, diphtheria, and acellular pertussis (Tdap, Td) vaccine. You may need a Td booster every 10 years. ?Zoster vaccine. You may need this after age 76. ?Pneumococcal 13-valent conjugate (PCV13) vaccine. One dose is  recommended after age 61. ?Pneumococcal polysaccharide (PPSV23) vaccine. One dose is recommended after age 30. ?Talk to your  health care provider about which screenings and vaccines you need and how often you need them. ?This information is not intended to replace advice given to you by your health care provider. Make sure you discuss any questions you have with your health care provider. ?Document Released: 03/10/2015 Document Revised: 11/01/2015 Document Reviewed: 12/13/2014 ?Elsevier Interactive Patient Education ? 2017 [place]. ? ?Fall Prevention in the Home ?Falls can cause injuries. They can happen to people of all ages. There are many things you can do to make your home safe and to help prevent falls. ?What can I do on the outside of my home? ?Regularly fix the edges of walkways and driveways and fix any cracks. ?Remove anything that might make you trip as you walk through a door, such as a raised step or threshold. ?Trim any bushes or trees on the path to your home. ?Use bright outdoor lighting. ?Clear any walking paths of anything that might make someone trip, such as rocks or tools. ?Regularly check to see if handrails are loose or broken. Make sure that both sides of any steps have handrails. ?Any raised decks and porches should have guardrails on the edges. ?Have any leaves, snow, or ice cleared regularly. ?Use sand or salt on walking paths during winter. ?Clean up any spills in your garage right away. This includes oil or grease spills. ?What can I do in the bathroom? ?Use night lights. ?Install grab bars by the toilet and in the tub and shower. Do not use towel bars as grab bars. ?Use non-skid mats or decals in the tub or shower. ?If you need to sit down in the shower, use a plastic, non-slip stool. ?Keep the floor dry. Clean up any water that spills on the floor as soon as it happens. ?Remove soap buildup in the tub or shower regularly. ?Attach bath mats securely with double-sided non-slip rug tape. ?Do not have throw rugs and other things on the floor that can make you trip. ?What can I do in the bedroom? ?Use night  lights. ?Make sure that you have a light by your bed that is easy to reach. ?Do not use any sheets or blankets that are too big for your bed. They should not hang down onto the floor. ?Have a firm chair that has side arms. You can use this for support while you get dressed. ?Do not have throw rugs and other things on the floor that can make you trip. ?What can I do in the kitchen? ?Clean up any spills right away. ?Avoid walking on wet floors. ?Keep items that you use a lot in easy-to-reach places. ?If you need to reach something above you, use a strong step stool that has a grab bar. ?Keep electrical cords out of the way. ?Do not use floor polish or wax that makes floors slippery. If you must use wax, use non-skid floor wax. ?Do not have throw rugs and other things on the floor that can make you trip. ?What can I do with my stairs? ?Do not leave any items on the stairs. ?Make sure that there are handrails on both sides of the stairs and use them. Fix handrails that are broken or loose. Make sure that handrails are as long as the stairways. ?Check any carpeting to make sure that it is firmly attached to the stairs. Fix any carpet that is loose or worn. ?Avoid having  throw rugs at the top or bottom of the stairs. If you do have throw rugs, attach them to the floor with carpet tape. ?Make sure that you have a light switch at the top of the stairs and the bottom of the stairs. If you do not have them, ask someone to add them for you. ?What else can I do to help prevent falls? ?Wear shoes that: ?Do not have high heels. ?Have rubber bottoms. ?Are comfortable and fit you well. ?Are closed at the toe. Do not wear sandals. ?If you use a stepladder: ?Make sure that it is fully opened. Do not climb a closed stepladder. ?Make sure that both sides of the stepladder are locked into place. ?Ask someone to hold it for you, if possible. ?Clearly mark and make sure that you can see: ?Any grab bars or handrails. ?First and last  steps. ?Where the edge of each step is. ?Use tools that help you move around (mobility aids) if they are needed. These include: ?Canes. ?Walkers. ?Scooters. ?Crutches. ?Turn on the lights when you go into a dark area. Rep

## 2021-05-15 NOTE — Progress Notes (Signed)
This service is provided via telemedicine ? ?No vital signs collected/recorded due to the encounter was a telemedicine visit.  ? ?Location of patient (ex: home, work):  Home ? ?Patient consents to a telephone visit:  Yes, see encounter 05/15/2021 ? ?Location of the provider (ex: office, home):  [place] ? ?Name of any referring provider:  N/A ? ?Names of all persons participating in the telemedicine service and their role in the encounter:  Sherrie Mustache, Nurse Practitioner, Carroll Kinds, CMA, and patient.  ? ?Time spent on call:  10 minutes with medical assistant ? ?

## 2021-05-15 NOTE — Progress Notes (Signed)
? ?Subjective:  ? Felicia Sawyer is a 81 y.o. female who presents for Medicare Annual (Subsequent) preventive examination. ? ?Review of Systems    ? ?Cardiac Risk Factors include: smoking/ tobacco exposure;advanced age (>57mn, >>36women);hypertension;dyslipidemia ? ?   ?Objective:  ?  ?There were no vitals filed for this visit. ?There is no height or weight on file to calculate BMI. ? ?Advanced Directives 05/15/2021 04/09/2021 10/02/2020 06/16/2020 12/02/2019 05/31/2019 05/10/2019  ?Does Patient Have a Medical Advance Directive? No No No No No No No  ?Does patient want to make changes to medical advance directive? - - - No - Patient declined - No - Patient declined -  ?Would patient like information on creating a medical advance directive? - - No - Patient declined - No - Patient declined - No - Patient declined  ? ? ?Current Medications (verified) ?Outpatient Encounter Medications as of 05/15/2021  ?Medication Sig  ? Ascorbic Acid (VITAMIN C PO) Take 500 mg by mouth daily. 1/2 tablet twice a day  ? aspirin 81 MG tablet Take 81 mg by mouth at bedtime.  ? cholecalciferol (VITAMIN D) 1000 units tablet Take 1,000 Units by mouth daily.  ? cloNIDine (CATAPRES) 0.1 MG tablet TAKE 1 TABLET BY MOUTH TWICE A DAY TO CONTROL BLOOD PRESSURE  ? clorazepate (TRANXENE) 7.5 MG tablet TAKE 1 TABLET(7.5 MG) BY MOUTH TWICE DAILY  ? docusate sodium (COLACE) 100 MG capsule Take 100 mg by mouth daily.  ? fexofenadine (ALLEGRA) 180 MG tablet Take 180 mg by mouth daily as needed for allergies.  ? furosemide (LASIX) 20 MG tablet Take 1 tablet (20 mg total) by mouth daily.  ? losartan (COZAAR) 100 MG tablet TAKE 1 TABLET BY MOUTH EVERY DAY FOR BLOOD PRESSURE  ? sennosides-docusate sodium (SENOKOT-S) 8.6-50 MG tablet Take 1 tablet by mouth 2 (two) times daily.  ? simvastatin (ZOCOR) 40 MG tablet Take one tablet by mouth once daily to lower cholesterol.  ? ?No facility-administered encounter medications on file as of 05/15/2021.   ? ? ?Allergies (verified) ?Caffeine, Demerol [meperidine], Venlafaxine hcl, and Pseudoephedrine  ? ?History: ?Past Medical History:  ?Diagnosis Date  ? Abnormality of gait 07/11/2005  ? Allergy   ? Anxiety state, unspecified 04/08/2012  ? Asymptomatic varicose veins 09/24/2011  ? Cataract   ? bilateral  ? Chronic venous insufficiency 09/16/2012  ? DDD (degenerative disc disease), cervical   ? Degenerative and vascular disorders of ear, unspecified 08/25/2002  ? Depressive disorder, not elsewhere classified 11/10/2002  ? Diverticulosis of colon (without mention of hemorrhage) 08/25/1989  ? Edema 09/24/2011  ? Esophageal reflux 11/10/2002  ? pt denies reflux  ? First degree atrioventricular block 07/19/2008  ? Heart murmur   ? Hypertension   ? Inguinal hernia without mention of obstruction or gangrene, unilateral or unspecified, (not specified as recurrent) 11/28/2006  ? Insomnia, unspecified 12/15/2007  ? Leiomyoma of uterus, unspecified 05/27/2006  ? Leukocytopenia, unspecified 08/28/2010  ? Lumbago 07/22/2007  ? Obesity, unspecified 05/26/1996  ? Osteoarthrosis, unspecified whether generalized or localized, unspecified site 05/26/2004  ? Other abnormal blood chemistry 07/19/2008  ? Other and unspecified hyperlipidemia 10/24/2004  ? Palpitations 08/29/2007  ? Peptic ulcer, unspecified site, unspecified as acute or chronic, without mention of hemorrhage, perforation, or obstruction 05/27/2006  ? Scoliosis (and kyphoscoliosis), idiopathic 05/23/2009  ? Symptomatic menopausal or female climacteric states 05/28/1990  ? Unspecified essential hypertension 11/13/2004  ? ?Past Surgical History:  ?Procedure Laterality Date  ? CATARACT EXTRACTION  2022  ? CHOLECYSTECTOMY  1971  ? ENDOMETRIAL BIOPSY  03/1992  ? Dr. Mallie Mussel  ? FOOT SURGERY Left 04/2003  ? [facility]- Dr. Caffie Pinto  ? INGUINAL HERNIA REPAIR Right 03/2007  ? Dr. Georgette Dover  ? KNEE ARTHROSCOPY Left 07/1997  ? Dr. Wonda Olds  ? KNEE SURGERY Right 03/02/2004  ? Dr. Berenice Primas   ? OOPHORECTOMY Right 1974  ? Dr. Amalia Hailey  ? THUMB ARTHROSCOPY Right 1995  ? Dr.Sypher  ? TONSILLECTOMY AND ADENOIDECTOMY  1975  ? TUBAL LIGATION    ? VESICOVAGINAL FISTULA CLOSURE W/ TAH  1975  ? ?Family History  ?Problem Relation Age of Onset  ? Kidney disease Mother   ? Hypertension Mother   ? Heart disease Father   ? Diabetes Father   ? Hypertension Sister   ? Atrial fibrillation Daughter   ? CVA Daughter   ? Drug abuse Son   ? Osteoporosis Daughter   ? Hypertension Daughter   ? Colon cancer Neg Hx   ? Colon polyps Neg Hx   ? Rectal cancer Neg Hx   ? Stomach cancer Neg Hx   ? Esophageal cancer Neg Hx   ? ?Social History  ? ?Socioeconomic History  ? Marital status: Widowed  ?  Spouse name: Not on file  ? Number of children: Not on file  ? Years of education: Not on file  ? Highest education level: Not on file  ?Occupational History  ? Not on file  ?Tobacco Use  ? Smoking status: Some Days  ?  Packs/day: 0.25  ?  Years: 60.00  ?  Pack years: 15.00  ?  Types: Cigarettes  ? Smokeless tobacco: Never  ? Tobacco comments:  ?  5 a day   ?Vaping Use  ? Vaping Use: Never used  ?Substance and Sexual Activity  ? Alcohol use: No  ? Drug use: No  ? Sexual activity: Not on file  ?Other Topics Concern  ? Not on file  ?Social History Narrative  ? Widowed 2013  ? Smokes about 1/2 pack a week  ? Alcohol none  ? Exercise 5 days a week, treadmill, dance class, zumba class  ? ?Social Determinants of Health  ? ?Financial Resource Strain: Not on file  ?Food Insecurity: Not on file  ?Transportation Needs: Not on file  ?Physical Activity: Not on file  ?Stress: Not on file  ?Social Connections: Not on file  ? ? ?Tobacco Counseling ?Ready to quit: Not Answered ?Counseling given: Not Answered ?Tobacco comments: 5 a day  ? ? ?Clinical Intake: ? ?Pre-visit preparation completed: Yes ? ?Pain : No/denies pain ? ?  ? ?BMI - recorded: 26 ?Nutritional Status: BMI 25 -29 Overweight ?Diabetes: No ? ?How often do you need to have someone help you when  you read instructions, pamphlets, or other written materials from your doctor or pharmacy?: 1 - Never ? ?Diabetic?no ? ?  ? ?  ? ? ?Activities of Daily Living ?In your present state of health, do you have any difficulty performing the following activities: 05/15/2021  ?Hearing? N  ?Vision? N  ?Difficulty concentrating or making decisions? N  ?Walking or climbing stairs? N  ?Dressing or bathing? N  ?Doing errands, shopping? N  ?Preparing Food and eating ? N  ?Using the Toilet? N  ?In the past six months, have you accidently leaked urine? N  ?Do you have problems with loss of bowel control? N  ?Managing your Medications? N  ?Managing your Finances? N  ?Housekeeping or managing your Housekeeping? N  ?Some  recent data might be hidden  ? ? ?Patient Care Team: ?Lauree Chandler, NP as PCP - General (Geriatric Medicine) ? ?Indicate any recent Medical Services you may have received from other than [facility] providers in the past year (date may be approximate). ? ?   ?Assessment:  ? This is a routine wellness examination for Glenys. ? ?Hearing/Vision screen ?Hearing Screening - Comments:: Patient has no hearing problems. ?Vision Screening - Comments:: Patient wears glasses. Patient had eye exam exam within past year. Patient see Dr. Wallis Mart. ? ?Dietary issues and exercise activities discussed: ?Current Exercise Habits: Home exercise routine, Type of exercise: walking;treadmill, Time (Minutes): 30, Frequency (Times/Week): 4, Weekly Exercise (Minutes/Week): 120 ? ? Goals Addressed   ?None ?  ?Depression Screen ?PHQ 2/9 Scores 05/15/2021 06/16/2020 12/02/2019 05/31/2019 05/10/2019 05/07/2018 11/06/2017  ?PHQ - 2 Score 0 0 0 0 0 0 0  ?  ?Fall Risk ?Fall Risk  05/15/2021 04/09/2021 10/02/2020 06/16/2020 12/02/2019  ?Falls in the past year? 0 0 0 0 0  ?Number falls in past yr: 0 0 0 0 0  ?Injury with Fall? 0 0 0 0 0  ?Risk for fall due to : No Fall Risks No Fall Risks No Fall Risks - -  ?Follow up Falls evaluation completed Falls  evaluation completed Falls evaluation completed - -  ? ? ?FALL RISK PREVENTION PERTAINING TO THE HOME: ? ?Any stairs in or around the home? Yes  ?If so, are there any without handrails? No  ?Home free of loose throw rugs in

## 2021-05-15 NOTE — Telephone Encounter (Signed)
Ms. getsemani, lindon are scheduled for a virtual visit with your provider today.   ? ?Just as we do with appointments in the office, we must obtain your consent to participate.  Your consent will be active for this visit and any virtual visit you may have with one of our providers in the next 365 days.   ? ?If you have a MyChart account, I can also send a copy of this consent to you electronically.  All virtual visits are billed to your insurance company just like a traditional visit in the office.  As this is a virtual visit, video technology does not allow for your provider to perform a traditional examination.  This may limit your provider's ability to fully assess your condition.  If your provider identifies any concerns that need to be evaluated in person or the need to arrange testing such as labs, EKG, etc, we will make arrangements to do so.   ? ?Although advances in technology are sophisticated, we cannot ensure that it will always work on either your end or our end.  If the connection with a video visit is poor, we may have to switch to a telephone visit.  With either a video or telephone visit, we are not always able to ensure that we have a secure connection.   I need to obtain your verbal consent now.   Are you willing to proceed with your visit today?  ? ?Felicia Sawyer has provided verbal consent on 05/15/2021 for a virtual visit (video or telephone). ? ? ?Carroll Kinds, CMA ?05/15/2021  9:38 AM ?  ?

## 2021-05-23 ENCOUNTER — Other Ambulatory Visit: Payer: Self-pay | Admitting: Nurse Practitioner

## 2021-05-23 DIAGNOSIS — I10 Essential (primary) hypertension: Secondary | ICD-10-CM

## 2021-06-15 ENCOUNTER — Other Ambulatory Visit: Payer: Self-pay | Admitting: Nurse Practitioner

## 2021-06-15 DIAGNOSIS — F418 Other specified anxiety disorders: Secondary | ICD-10-CM

## 2021-06-15 NOTE — Telephone Encounter (Signed)
Patient has request refill on medication "Clorazepate 7.'5mg'$ ". Patient last refill dated 12/18/2020. Patient doesn't have Non Opioid Contract. Patient has upcoming appointment 10/08/2021. "SIGN CONTRACT" added to patient appointment note. Medication pend and sent to PCP Dewaine Oats [employer], NP for approval.  ?

## 2021-08-01 ENCOUNTER — Other Ambulatory Visit: Payer: Self-pay | Admitting: *Deleted

## 2021-08-01 DIAGNOSIS — I10 Essential (primary) hypertension: Secondary | ICD-10-CM

## 2021-08-01 MED ORDER — CLONIDINE HCL 0.1 MG PO TABS: ORAL_TABLET | ORAL | 1 refills | Status: DC

## 2021-08-01 NOTE — Telephone Encounter (Signed)
Patient requested refill to be sent to [facility] [address].

## 2021-08-30 ENCOUNTER — Telehealth: Payer: Self-pay

## 2021-08-30 DIAGNOSIS — I10 Essential (primary) hypertension: Secondary | ICD-10-CM

## 2021-08-30 MED ORDER — LOSARTAN POTASSIUM 100 MG PO TABS: ORAL_TABLET | ORAL | 1 refills | Status: DC

## 2021-08-30 NOTE — Telephone Encounter (Signed)
Need prescription refilled and calling to get it [facility] to [facility] on [address] losartan '100mg'$  1 daily.   She has discussed with pharmacy already. Sent to [facility].

## 2021-10-08 ENCOUNTER — Ambulatory Visit (INDEPENDENT_AMBULATORY_CARE_PROVIDER_SITE_OTHER): Payer: Medicare HMO | Admitting: Nurse Practitioner

## 2021-10-08 ENCOUNTER — Encounter: Payer: Self-pay | Admitting: Nurse Practitioner

## 2021-10-08 VITALS — BP 128/78 | HR 60 | Temp 97.9°F | Ht 61.0 in | Wt 138.0 lb

## 2021-10-08 DIAGNOSIS — F3341 Major depressive disorder, recurrent, in partial remission: Secondary | ICD-10-CM

## 2021-10-08 DIAGNOSIS — M8589 Other specified disorders of bone density and structure, multiple sites: Secondary | ICD-10-CM

## 2021-10-08 DIAGNOSIS — E785 Hyperlipidemia, unspecified: Secondary | ICD-10-CM

## 2021-10-08 DIAGNOSIS — K5901 Slow transit constipation: Secondary | ICD-10-CM

## 2021-10-08 DIAGNOSIS — F17200 Nicotine dependence, unspecified, uncomplicated: Secondary | ICD-10-CM

## 2021-10-08 DIAGNOSIS — I10 Essential (primary) hypertension: Secondary | ICD-10-CM | POA: Diagnosis not present

## 2021-10-08 DIAGNOSIS — F411 Generalized anxiety disorder: Secondary | ICD-10-CM

## 2021-10-08 NOTE — Patient Instructions (Addendum)
Make sure to eat 3 meals a day Continue Protein supplement with smallest meal a day.    Get flu shot at local in September or October

## 2021-10-08 NOTE — Progress Notes (Signed)
Careteam: Patient Care Team: Felicia Chandler, NP as PCP - General (Geriatric Medicine)  PLACE OF SERVICE:  [place] Directive information Does Patient Have a Medical Advance Directive?: No, Would patient like information on creating a medical advance directive?: No - Patient declined  Allergies  Allergen Reactions  . Caffeine Other (See Comments)    Rapid heart rate   . Demerol [Meperidine]   . Venlafaxine Hcl Other (See Comments)    Hyper  . Pseudoephedrine Palpitations    Chief Complaint  Patient presents with  . Medical Management of Chronic Issues    6 month follow-up. Sign treatment agreement for Tranxene. Discuss need for shingrix, td/tdap, covid booster, and flu vaccine (out of stock) or post pone if patient refuses.      HPI: Patient is a 81 y.o. female for routine follow up.  She reports she is not eating like she used to- does not like to cook.  She has a craving for sweets.   Reports she walks daily to stay active.   No pain.   Continues to work on cutting back on smoking- 1 pack last 3.5 days.   Bowels moving well.   Ongoing anxiety and depression but overall controlled.   Review of Systems:  Review of Systems  Constitutional:  Negative for chills, fever and weight loss.  HENT:  Negative for tinnitus.   Respiratory:  Negative for cough, sputum production and shortness of breath.   Cardiovascular:  Negative for chest pain, palpitations and leg swelling.  Gastrointestinal:  Negative for abdominal pain, constipation, diarrhea and heartburn.  Genitourinary:  Negative for dysuria, frequency and urgency.  Musculoskeletal:  Negative for back pain, falls, joint pain and myalgias.  Skin: Negative.   Neurological:  Negative for dizziness and headaches.  Psychiatric/Behavioral:  Positive for depression. Negative for memory loss. The patient is nervous/anxious. The patient does not have insomnia.     Past Medical History:  Diagnosis Date  .  Abnormality of gait 07/11/2005  . Allergy   . Anxiety state, unspecified 04/08/2012  . Asymptomatic varicose veins 09/24/2011  . Cataract    bilateral  . Chronic venous insufficiency 09/16/2012  . DDD (degenerative disc disease), cervical   . Degenerative and vascular disorders of ear, unspecified 08/25/2002  . Depressive disorder, not elsewhere classified 11/10/2002  . Diverticulosis of colon (without mention of hemorrhage) 08/25/1989  . Edema 09/24/2011  . Esophageal reflux 11/10/2002   pt denies reflux  . First degree atrioventricular block 07/19/2008  . Heart murmur   . Hypertension   . Inguinal hernia without mention of obstruction or gangrene, unilateral or unspecified, (not specified as recurrent) 11/28/2006  . Insomnia, unspecified 12/15/2007  . Leiomyoma of uterus, unspecified 05/27/2006  . Leukocytopenia, unspecified 08/28/2010  . Lumbago 07/22/2007  . Obesity, unspecified 05/26/1996  . Osteoarthrosis, unspecified whether generalized or localized, unspecified site 05/26/2004  . Other abnormal blood chemistry 07/19/2008  . Other and unspecified hyperlipidemia 10/24/2004  . Palpitations 08/29/2007  . Peptic ulcer, unspecified site, unspecified as acute or chronic, without mention of hemorrhage, perforation, or obstruction 05/27/2006  . Scoliosis (and kyphoscoliosis), idiopathic 05/23/2009  . Symptomatic menopausal or female climacteric states 05/28/1990  . Unspecified essential hypertension 11/13/2004   Past Surgical History:  Procedure Laterality Date  . CATARACT EXTRACTION  2022  . CHOLECYSTECTOMY  1971  . ENDOMETRIAL BIOPSY  03/1992   Dr. Mallie Mussel  . FOOT SURGERY Left 04/2003   [facility]- Dr. Caffie Pinto  . INGUINAL HERNIA  REPAIR Right 03/2007   Dr. Georgette Dover  . KNEE ARTHROSCOPY Left 07/1997   Dr. Wonda Olds  . KNEE SURGERY Right 03/02/2004   Dr. Berenice Primas  . OOPHORECTOMY Right 1974   Dr. Amalia Hailey  . THUMB ARTHROSCOPY Right 1995   Dr.Sypher  . TONSILLECTOMY AND  ADENOIDECTOMY  1975  . TUBAL LIGATION    . VESICOVAGINAL FISTULA CLOSURE W/ TAH  1975   Social History:   reports that she has been smoking cigarettes. She has a 15.00 pack-year smoking history. She has never used smokeless tobacco. She reports that she does not drink alcohol and does not use drugs.  Family History  Problem Relation Age of Onset  . Kidney disease Mother   . Hypertension Mother   . Heart disease Father   . Diabetes Father   . Hypertension Sister   . Atrial fibrillation Daughter   . CVA Daughter   . Drug abuse Son   . Osteoporosis Daughter   . Hypertension Daughter   . Colon cancer Neg Hx   . Colon polyps Neg Hx   . Rectal cancer Neg Hx   . Stomach cancer Neg Hx   . Esophageal cancer Neg Hx     Medications: Patient's Medications  New Prescriptions   No medications on file  Previous Medications   ASCORBIC ACID (VITAMIN C PO)    500 mg. 1/2 tablet twice a day   ASPIRIN 81 MG TABLET    Take 81 mg by mouth at bedtime.   CHOLECALCIFEROL (VITAMIN D) 1000 UNITS TABLET    Take 1,000 Units by mouth daily.   CLONIDINE (CATAPRES) 0.1 MG TABLET    Take one tablet by mouth twice daily to control blood pressure.   CLORAZEPATE (TRANXENE) 7.5 MG TABLET    TAKE 1 TABLET(7.5 MG) BY MOUTH TWICE DAILY   DOCUSATE SODIUM (COLACE) 100 MG CAPSULE    Take 100 mg by mouth daily.   FEXOFENADINE (ALLEGRA) 180 MG TABLET    Take 180 mg by mouth daily as needed for allergies.   FUROSEMIDE (LASIX) 20 MG TABLET    Take 1 tablet (20 mg total) by mouth daily.   LOSARTAN (COZAAR) 100 MG TABLET    TAKE 1 TABLET BY MOUTH EVERY DAY FOR BLOOD PRESSURE   SENNOSIDES-DOCUSATE SODIUM (SENOKOT-S) 8.6-50 MG TABLET    Take 1 tablet by mouth 2 (two) times daily.   SIMVASTATIN (ZOCOR) 40 MG TABLET    Take one tablet by mouth once daily to lower cholesterol.  Modified Medications   No medications on file  Discontinued Medications   No medications on file    Physical Exam:  Vitals:   10/08/21 1034   BP: 128/78  Pulse: 60  Temp: 97.9 F (36.6 C)  TempSrc: Temporal  SpO2: 96%  Weight: 138 lb (62.6 kg)  Height: '5\' 1"'  (1.549 m)   Body mass index is 26.07 kg/m. Wt Readings from Last 3 Encounters:  10/08/21 138 lb (62.6 kg)  04/09/21 142 lb 12.8 oz (64.8 kg)  10/02/20 142 lb (64.4 kg)    Physical Exam Constitutional:      General: She is not in acute distress.    Appearance: She is well-developed. She is not diaphoretic.  HENT:     Head: Normocephalic and atraumatic.     Mouth/Throat:     Pharynx: No oropharyngeal exudate.  Eyes:     Conjunctiva/sclera: Conjunctivae normal.     Pupils: Pupils are equal, round, and reactive to light.  Cardiovascular:  Rate and Rhythm: Normal rate and regular rhythm.     Heart sounds: Normal heart sounds.  Pulmonary:     Effort: Pulmonary effort is normal.     Breath sounds: Normal breath sounds.  Abdominal:     General: Bowel sounds are normal.     Palpations: Abdomen is soft.  Musculoskeletal:     Cervical back: Normal range of motion and neck supple.     Right lower leg: No edema.     Left lower leg: No edema.  Skin:    General: Skin is warm and dry.  Neurological:     Mental Status: She is alert and oriented to person, place, and time.  Psychiatric:        Mood and Affect: Mood normal.    Labs reviewed: Basic Metabolic Panel: Recent Labs    04/09/21 1046  NA 136  K 4.2  CL 100  CO2 33*  GLUCOSE 63*  BUN 10  CREATININE 0.86  CALCIUM 9.1   Liver Function Tests: Recent Labs    04/09/21 1046  AST 15  ALT 12  BILITOT 0.4  PROT 6.2   No results for input(s): "LIPASE", "AMYLASE" in the last 8760 hours. No results for input(s): "AMMONIA" in the last 8760 hours. CBC: Recent Labs    04/09/21 1046  WBC 3.3*  NEUTROABS 1,356*  HGB 12.3  HCT 36.9  MCV 96.1  PLT 154   Lipid Panel: No results for input(s): "CHOL", "HDL", "LDLCALC", "TRIG", "CHOLHDL", "LDLDIRECT" in the last 8760 hours. TSH: No results for  input(s): "TSH" in the last 8760 hours. A1C: Lab Results  Component Value Date   HGBA1C 5.3 12/02/2019     Assessment/Plan 1. Hyperlipidemia, unspecified hyperlipidemia type Continues on zocor with dietary modifications encouraged.  - Lipid panel - CMP with eGFR([facility])  2. Essential hypertension -Blood pressure well controlled, goal bp <140/90 Continue current medications and dietary modifications follow metabolic panel - CMP with eGFR([facility]) - CBC with Differential/Platelet  3. Recurrent major depressive disorder, in partial remission (HCC) ***  4. Slow transit constipation ***  5. Osteopenia of multiple sites ***  6. Anxiety state ***  7. Tobacco use disorder ***   Return in about 6 months (around 04/10/2022) for routine follow up.: *** Ellyson Rarick K. Felsenthal, [facility] [phone]

## 2021-10-09 LAB — CBC WITH DIFFERENTIAL/PLATELET
Absolute Monocytes: 310 cells/uL (ref 200–950)
Basophils Absolute: 41 cells/uL (ref 0–200)
Basophils Relative: 1.4 %
Eosinophils Absolute: 151 cells/uL (ref 15–500)
Eosinophils Relative: 5.2 %
HCT: 36.9 % (ref 35.0–45.0)
Hemoglobin: 12.2 g/dL (ref 11.7–15.5)
Lymphs Abs: 1201 cells/uL (ref 850–3900)
MCH: 31.9 pg (ref 27.0–33.0)
MCHC: 33.1 g/dL (ref 32.0–36.0)
MCV: 96.6 fL (ref 80.0–100.0)
MPV: 10.9 fL (ref 7.5–12.5)
Monocytes Relative: 10.7 %
Neutro Abs: 1198 cells/uL — ABNORMAL LOW (ref 1500–7800)
Neutrophils Relative %: 41.3 %
Platelets: 159 10*3/uL (ref 140–400)
RBC: 3.82 10*6/uL (ref 3.80–5.10)
RDW: 11.3 % (ref 11.0–15.0)
Total Lymphocyte: 41.4 %
WBC: 2.9 10*3/uL — ABNORMAL LOW (ref 3.8–10.8)

## 2021-10-09 LAB — COMPLETE METABOLIC PANEL WITH GFR
AG Ratio: 1.5 (calc) (ref 1.0–2.5)
ALT: 10 U/L (ref 6–29)
AST: 12 U/L (ref 10–35)
Albumin: 3.7 g/dL (ref 3.6–5.1)
Alkaline phosphatase (APISO): 56 U/L (ref 37–153)
BUN: 10 mg/dL (ref 7–25)
CO2: 29 mmol/L (ref 20–32)
Calcium: 9.1 mg/dL (ref 8.6–10.4)
Chloride: 99 mmol/L (ref 98–110)
Creat: 0.83 mg/dL (ref 0.60–1.00)
Globulin: 2.5 g/dL (calc) (ref 1.9–3.7)
Glucose, Bld: 86 mg/dL (ref 65–99)
Potassium: 4.5 mmol/L (ref 3.5–5.3)
Sodium: 135 mmol/L (ref 135–146)
Total Bilirubin: 0.5 mg/dL (ref 0.2–1.2)
Total Protein: 6.2 g/dL (ref 6.1–8.1)
eGFR: 72 mL/min/{1.73_m2} (ref >=60)

## 2021-10-09 LAB — LIPID PANEL
Cholesterol: 147 mg/dL (ref <200)
HDL: 60 mg/dL (ref >=50)
LDL Cholesterol (Calc): 72 mg/dL (calc)
Non-HDL Cholesterol (Calc): 87 mg/dL (calc) (ref <130)
Total CHOL/HDL Ratio: 2.5 (calc) (ref <5.0)
Triglycerides: 69 mg/dL (ref <150)

## 2021-10-15 ENCOUNTER — Other Ambulatory Visit: Payer: Self-pay | Admitting: Nurse Practitioner

## 2021-10-15 DIAGNOSIS — R600 Localized edema: Secondary | ICD-10-CM

## 2021-10-15 DIAGNOSIS — N179 Acute kidney failure, unspecified: Secondary | ICD-10-CM

## 2021-10-15 DIAGNOSIS — I10 Essential (primary) hypertension: Secondary | ICD-10-CM

## 2021-10-25 ENCOUNTER — Other Ambulatory Visit: Payer: Self-pay

## 2021-10-25 ENCOUNTER — Ambulatory Visit
Admission: RE | Admit: 2021-10-25 | Discharge: 2021-10-25 | Disposition: A | Discharge status: Home / Self Care | Payer: Medicare HMO | Source: Ambulatory Visit | Attending: Nurse Practitioner | Admitting: Nurse Practitioner

## 2021-10-25 DIAGNOSIS — E2839 Other primary ovarian failure: Secondary | ICD-10-CM

## 2021-12-13 ENCOUNTER — Other Ambulatory Visit: Payer: Self-pay | Admitting: Nurse Practitioner

## 2021-12-13 DIAGNOSIS — F418 Other specified anxiety disorders: Secondary | ICD-10-CM

## 2021-12-13 NOTE — Telephone Encounter (Signed)
Patient has request refill on medication Clorazepate 7.'5mg'$ . Patient last refill dated 06/15/2021. Patient has Non Opioid Contract dated 10/12/2021. Medication pend and sent to PCP Dewaine Oats Carlos American, NP for approval.

## 2022-01-25 ENCOUNTER — Other Ambulatory Visit: Payer: Self-pay | Admitting: Nurse Practitioner

## 2022-01-25 DIAGNOSIS — I10 Essential (primary) hypertension: Secondary | ICD-10-CM

## 2022-02-20 ENCOUNTER — Other Ambulatory Visit: Payer: Self-pay | Admitting: Nurse Practitioner

## 2022-02-20 DIAGNOSIS — I10 Essential (primary) hypertension: Secondary | ICD-10-CM

## 2022-04-15 ENCOUNTER — Ambulatory Visit (INDEPENDENT_AMBULATORY_CARE_PROVIDER_SITE_OTHER): Payer: Medicare HMO | Admitting: Nurse Practitioner

## 2022-04-15 ENCOUNTER — Encounter: Payer: Self-pay | Admitting: Nurse Practitioner

## 2022-04-15 VITALS — BP 138/78 | HR 80 | Temp 97.7°F | Ht 61.0 in | Wt 138.8 lb

## 2022-04-15 DIAGNOSIS — M8589 Other specified disorders of bone density and structure, multiple sites: Secondary | ICD-10-CM

## 2022-04-15 DIAGNOSIS — I10 Essential (primary) hypertension: Secondary | ICD-10-CM | POA: Diagnosis not present

## 2022-04-15 DIAGNOSIS — R600 Localized edema: Secondary | ICD-10-CM

## 2022-04-15 DIAGNOSIS — K5901 Slow transit constipation: Secondary | ICD-10-CM

## 2022-04-15 DIAGNOSIS — F411 Generalized anxiety disorder: Secondary | ICD-10-CM

## 2022-04-15 DIAGNOSIS — E785 Hyperlipidemia, unspecified: Secondary | ICD-10-CM

## 2022-04-15 DIAGNOSIS — F17200 Nicotine dependence, unspecified, uncomplicated: Secondary | ICD-10-CM

## 2022-04-15 NOTE — Patient Instructions (Addendum)
TDAP, recommended at local pharmacy   Decrease lasix to every other day.

## 2022-04-15 NOTE — Progress Notes (Signed)
Careteam: Patient Care Team: Lauree Chandler, NP as PCP - General (Geriatric Medicine)  PLACE OF SERVICE:  [place]  Advanced Directive information    Allergies  Allergen Reactions   Caffeine Other (See Comments)    Rapid heart rate    Demerol [Meperidine]    Venlafaxine Hcl Other (See Comments)    Hyper   Pseudoephedrine Palpitations    Chief Complaint  Patient presents with   Medical Management of Chronic Issues    Patient presents today for a 6 month follow-up   Quality Metric Gaps    AWV, TDAP, zoster, COVID#5     HPI: Patient is a 82 y.o. female for routine follow up.   Reports blood pressure always up when she comes to the office- always normal at time.   She continues to work on cutting back on smoking- a pack can last 3-4 days. Has cut back over the last 2 years.   Had vaccine at [facility] on [address].  Review of Systems:  Review of Systems  Constitutional:  Negative for chills, fever and weight loss.  HENT:  Negative for tinnitus.   Respiratory:  Negative for cough, sputum production and shortness of breath.   Cardiovascular:  Negative for chest pain, palpitations and leg swelling.  Gastrointestinal:  Negative for abdominal pain, constipation, diarrhea and heartburn.  Genitourinary:  Negative for dysuria, frequency and urgency.  Musculoskeletal:  Negative for back pain, falls, joint pain and myalgias.  Skin: Negative.   Neurological:  Negative for dizziness and headaches.  Psychiatric/Behavioral:  Negative for depression and memory loss. The patient does not have insomnia.     Past Medical History:  Diagnosis Date   Abnormality of gait 07/11/2005   Allergy    Anxiety state, unspecified 04/08/2012   Asymptomatic varicose veins 09/24/2011   Cataract    bilateral   Chronic venous insufficiency 09/16/2012   DDD (degenerative disc disease), cervical    Degenerative and vascular disorders of ear, unspecified 08/25/2002   Depressive  disorder, not elsewhere classified 11/10/2002   Diverticulosis of colon (without mention of hemorrhage) 08/25/1989   Edema 09/24/2011   Esophageal reflux 11/10/2002   pt denies reflux   First degree atrioventricular block 07/19/2008   Heart murmur    Hypertension    Inguinal hernia without mention of obstruction or gangrene, unilateral or unspecified, (not specified as recurrent) 11/28/2006   Insomnia, unspecified 12/15/2007   Leiomyoma of uterus, unspecified 05/27/2006   Leukocytopenia, unspecified 08/28/2010   Lumbago 07/22/2007   Obesity, unspecified 05/26/1996   Osteoarthrosis, unspecified whether generalized or localized, unspecified site 05/26/2004   Other abnormal blood chemistry 07/19/2008   Other and unspecified hyperlipidemia 10/24/2004   Palpitations 08/29/2007   Peptic ulcer, unspecified site, unspecified as acute or chronic, without mention of hemorrhage, perforation, or obstruction 05/27/2006   Scoliosis (and kyphoscoliosis), idiopathic 05/23/2009   Symptomatic menopausal or female climacteric states 05/28/1990   Unspecified essential hypertension 11/13/2004   Past Surgical History:  Procedure Laterality Date   CATARACT EXTRACTION  2022   CHOLECYSTECTOMY  1971   ENDOMETRIAL BIOPSY  03/1992   Dr. Mallie Mussel   FOOT SURGERY Left 04/2003   [facility]- Dr. Payton Mccallum HERNIA REPAIR Right 03/2007   Dr. Georgette Dover   KNEE ARTHROSCOPY Left 07/1997   Dr. Wonda Olds   KNEE SURGERY Right 03/02/2004   Dr. Berenice Primas   OOPHORECTOMY Right 1974   Dr. Amalia Hailey   THUMB ARTHROSCOPY Right 1995   Dr.Sypher   TONSILLECTOMY  AND ADENOIDECTOMY  1975   TUBAL LIGATION     VESICOVAGINAL FISTULA CLOSURE W/ TAH  1975   Social History:   reports that she has been smoking cigarettes. She has a 15.00 pack-year smoking history. She has never used smokeless tobacco. She reports that she does not drink alcohol and does not use drugs.  Family History  Problem Relation Age of Onset   Kidney disease  Mother    Hypertension Mother    Heart disease Father    Diabetes Father    Hypertension Sister    Atrial fibrillation Daughter    CVA Daughter    Drug abuse Son    Osteoporosis Daughter    Hypertension Daughter    Colon cancer Neg Hx    Colon polyps Neg Hx    Rectal cancer Neg Hx    Stomach cancer Neg Hx    Esophageal cancer Neg Hx     Medications: Patient's Medications  New Prescriptions   No medications on file  Previous Medications   ASCORBIC ACID (VITAMIN C PO)    500 mg. 1/2 tablet twice a day   ASPIRIN 81 MG TABLET    Take 81 mg by mouth at bedtime.   CALCIUM CARBONATE (CALCIUM 600 PO)    Take 1 tablet by mouth 2 (two) times daily.   CHOLECALCIFEROL (VITAMIN D3) 50 MCG (2000 UT) CAPSULE    Take 2,000 Units by mouth daily.   CLONIDINE (CATAPRES) 0.1 MG TABLET    TAKE 1 TABLET BY MOUTH TWICE DAILY FOR BLOOD PRESSURE   CLORAZEPATE (TRANXENE) 7.5 MG TABLET    TAKE 1 TABLET(7.5 MG) BY MOUTH TWICE DAILY   DOCUSATE SODIUM (COLACE) 100 MG CAPSULE    Take 100 mg by mouth daily.   FEXOFENADINE (ALLEGRA) 180 MG TABLET    Take 180 mg by mouth daily as needed for allergies.   FUROSEMIDE (LASIX) 20 MG TABLET    TAKE 1 TABLET(20 MG) BY MOUTH DAILY   LOSARTAN (COZAAR) 100 MG TABLET    TAKE 1 TABLET BY MOUTH EVERY DAY FOR BLOOD PRESSURE   SENNOSIDES-DOCUSATE SODIUM (SENOKOT-S) 8.6-50 MG TABLET    Take 1 tablet by mouth 2 (two) times daily.   SIMVASTATIN (ZOCOR) 40 MG TABLET    TAKE 1 TABLET BY MOUTH EVERY DAY FOR CHOLESTEROL  Modified Medications   No medications on file  Discontinued Medications   No medications on file    Physical Exam:  Vitals:   04/15/22 1020 04/15/22 1043  BP: (!) 142/84 138/78  Pulse: 80   Temp: 97.7 F (36.5 C)   SpO2: 96%   Weight: 138 lb 12.8 oz (63 kg)   Height: 5' 1"$  (1.549 m)    Body mass index is 26.23 kg/m. Wt Readings from Last 3 Encounters:  04/15/22 138 lb 12.8 oz (63 kg)  10/08/21 138 lb (62.6 kg)  04/09/21 142 lb 12.8 oz (64.8 kg)     Physical Exam Constitutional:      General: She is not in acute distress.    Appearance: She is well-developed. She is not diaphoretic.  HENT:     Head: Normocephalic and atraumatic.     Mouth/Throat:     Pharynx: No oropharyngeal exudate.  Eyes:     Conjunctiva/sclera: Conjunctivae normal.     Pupils: Pupils are equal, round, and reactive to light.  Cardiovascular:     Rate and Rhythm: Normal rate and regular rhythm.     Heart sounds: Normal heart sounds.  Pulmonary:  Effort: Pulmonary effort is normal.     Breath sounds: Normal breath sounds.  Abdominal:     General: Bowel sounds are normal.     Palpations: Abdomen is soft.  Musculoskeletal:     Cervical back: Normal range of motion and neck supple.     Right lower leg: No edema.     Left lower leg: No edema.  Skin:    General: Skin is warm and dry.  Neurological:     Mental Status: She is alert.  Psychiatric:        Mood and Affect: Mood normal.     Labs reviewed: Basic Metabolic Panel: Recent Labs    10/08/21 1106  NA 135  K 4.5  CL 99  CO2 29  GLUCOSE 86  BUN 10  CREATININE 0.83  CALCIUM 9.1   Liver Function Tests: Recent Labs    10/08/21 1106  AST 12  ALT 10  BILITOT 0.5  PROT 6.2   No results for input(s): "LIPASE", "AMYLASE" in the last 8760 hours. No results for input(s): "AMMONIA" in the last 8760 hours. CBC: Recent Labs    10/08/21 1106  WBC 2.9*  NEUTROABS 1,198*  HGB 12.2  HCT 36.9  MCV 96.6  PLT 159   Lipid Panel: Recent Labs    10/08/21 1106  CHOL 147  HDL 60  LDLCALC 72  TRIG 69  CHOLHDL 2.5   TSH: No results for input(s): "TSH" in the last 8760 hours. A1C: Lab Results  Component Value Date   HGBA1C 5.3 12/02/2019     Assessment/Plan 1. Essential hypertension -Blood pressure well controlled- improved on recheck- at goal at home, goal bp <140/90 Continue current medications and dietary modifications follow metabolic panel - CBC with  Differential/Platelet - Complete Metabolic Panel with eGFR  2. Anxiety state Controlled on tranxene BID, continue lifestyle modifications  3. Osteopenia of multiple sites -Recommended to take calcium 600 mg twice daily with Vitamin D 2000 units daily and weight bearing activity 30 mins/5 days a week  4. Slow transit constipation -controlled on senokot-s daily -encouraged dietary modification as well  5. Tobacco use disorder -continues to work on cutting back and cessation encourage - Ambulatory Referral for Lung Cancer Scre  6. Edema of both lower extremities -well controlled, encouraged to use lasix every other day at this time  7. Hyperlipidemia, unspecified hyperlipidemia type -continues on zocor with dietary modifications.    Return in about 6 months (around 10/14/2022) for routine follow up.  Carlos American. [place], [facility] [phone]

## 2022-04-16 LAB — CBC WITH DIFFERENTIAL/PLATELET
Absolute Monocytes: 389 cells/uL (ref 200–950)
Basophils Absolute: 40 cells/uL (ref 0–200)
Basophils Relative: 1.1 %
Eosinophils Absolute: 158 cells/uL (ref 15–500)
Eosinophils Relative: 4.4 %
HCT: 35.8 % (ref 35.0–45.0)
Hemoglobin: 12.1 g/dL (ref 11.7–15.5)
Lymphs Abs: 1307 cells/uL (ref 850–3900)
MCH: 31.5 pg (ref 27.0–33.0)
MCHC: 33.8 g/dL (ref 32.0–36.0)
MCV: 93.2 fL (ref 80.0–100.0)
MPV: 11 fL (ref 7.5–12.5)
Monocytes Relative: 10.8 %
Neutro Abs: 1706 cells/uL (ref 1500–7800)
Neutrophils Relative %: 47.4 %
Platelets: 161 10*3/uL (ref 140–400)
RBC: 3.84 10*6/uL (ref 3.80–5.10)
RDW: 11.4 % (ref 11.0–15.0)
Total Lymphocyte: 36.3 %
WBC: 3.6 10*3/uL — ABNORMAL LOW (ref 3.8–10.8)

## 2022-04-16 LAB — COMPLETE METABOLIC PANEL WITH GFR
AG Ratio: 1.4 (calc) (ref 1.0–2.5)
ALT: 15 U/L (ref 6–29)
AST: 15 U/L (ref 10–35)
Albumin: 3.7 g/dL (ref 3.6–5.1)
Alkaline phosphatase (APISO): 62 U/L (ref 37–153)
BUN: 12 mg/dL (ref 7–25)
CO2: 32 mmol/L (ref 20–32)
Calcium: 8.9 mg/dL (ref 8.6–10.4)
Chloride: 100 mmol/L (ref 98–110)
Creat: 0.81 mg/dL (ref 0.60–1.00)
Globulin: 2.7 g/dL (calc) (ref 1.9–3.7)
Glucose, Bld: 88 mg/dL (ref 65–99)
Potassium: 4.3 mmol/L (ref 3.5–5.3)
Sodium: 136 mmol/L (ref 135–146)
Total Bilirubin: 0.5 mg/dL (ref 0.2–1.2)
Total Protein: 6.4 g/dL (ref 6.1–8.1)
eGFR: 74 mL/min/{1.73_m2} (ref >=60)

## 2022-04-25 ENCOUNTER — Other Ambulatory Visit: Payer: Self-pay | Admitting: Nurse Practitioner

## 2022-04-25 DIAGNOSIS — N179 Acute kidney failure, unspecified: Secondary | ICD-10-CM

## 2022-04-25 DIAGNOSIS — I10 Essential (primary) hypertension: Secondary | ICD-10-CM

## 2022-04-25 DIAGNOSIS — R600 Localized edema: Secondary | ICD-10-CM

## 2022-04-29 ENCOUNTER — Other Ambulatory Visit: Payer: Self-pay

## 2022-04-29 DIAGNOSIS — I10 Essential (primary) hypertension: Secondary | ICD-10-CM

## 2022-04-29 DIAGNOSIS — N179 Acute kidney failure, unspecified: Secondary | ICD-10-CM

## 2022-04-29 DIAGNOSIS — R600 Localized edema: Secondary | ICD-10-CM

## 2022-04-29 MED ORDER — FUROSEMIDE 20 MG PO TABS: 20.0000 mg | ORAL_TABLET | ORAL | 1 refills | Status: DC

## 2022-05-03 ENCOUNTER — Other Ambulatory Visit: Payer: Self-pay | Admitting: Nurse Practitioner

## 2022-05-21 ENCOUNTER — Encounter: Payer: Self-pay | Admitting: Nurse Practitioner

## 2022-05-21 ENCOUNTER — Ambulatory Visit (INDEPENDENT_AMBULATORY_CARE_PROVIDER_SITE_OTHER): Payer: Medicare HMO | Admitting: Nurse Practitioner

## 2022-05-21 DIAGNOSIS — Z0000 Encounter for general adult medical examination without abnormal findings: Secondary | ICD-10-CM | POA: Diagnosis not present

## 2022-05-21 NOTE — Patient Instructions (Signed)
[name] , Thank you for taking time to come for your Medicare Wellness Visit. I appreciate your ongoing commitment to your health goals. Please review the following plan we discussed and let me know if I can assist you in the future.   Screening recommendations/referrals: Colonoscopy aged out Mammogram aged out Bone Density up to date Recommended yearly ophthalmology/optometry visit for glaucoma screening and checkup Recommended yearly dental visit for hygiene and checkup  Vaccinations: Influenza vaccine- due annually in September/October Pneumococcal vaccine up to date Tdap vaccine DUE- recommend to get at your local pharmacy Shingles vaccine DUE- recommend to get at your local pharmacy    Advanced directives: recommend to complete  Conditions/risks identified: advanced age.  Next appointment: yearly    Preventive Care 74 Years and Older, Female Preventive care refers to lifestyle choices and visits with your health care provider that can promote health and wellness. What does preventive care include? A yearly physical exam. This is also called an annual well check. Dental exams once or twice a year. Routine eye exams. Ask your health care provider how often you should have your eyes checked. Personal lifestyle choices, including: Daily care of your teeth and gums. Regular physical activity. Eating a healthy diet. Avoiding tobacco and drug use. Limiting alcohol use. Practicing safe sex. Taking low-dose aspirin every day. Taking vitamin and mineral supplements as recommended by your health care provider. What happens during an annual well check? The services and screenings done by your health care provider during your annual well check will depend on your age, overall health, lifestyle risk factors, and family history of disease. Counseling  Your health care provider may ask you questions about your: Alcohol use. Tobacco use. Drug use. Emotional well-being. Home and  relationship well-being. Sexual activity. Eating habits. History of falls. Memory and ability to understand (cognition). Work and work Statistician. Reproductive health. Screening  You may have the following tests or measurements: Height, weight, and BMI. Blood pressure. Lipid and cholesterol levels. These may be checked every 5 years, or more frequently if you are over 80 years old. Skin check. Lung cancer screening. You may have this screening every year starting at age 82 if you have a 30-pack-year history of smoking and currently smoke or have quit within the past 15 years. Fecal occult blood test (FOBT) of the stool. You may have this test every year starting at age 60. Flexible sigmoidoscopy or colonoscopy. You may have a sigmoidoscopy every 5 years or a colonoscopy every 10 years starting at age 33. Hepatitis C blood test. Hepatitis B blood test. Sexually transmitted disease (STD) testing. Diabetes screening. This is done by checking your blood sugar (glucose) after you have not eaten for a while (fasting). You may have this done every 1-3 years. Bone density scan. This is done to screen for osteoporosis. You may have this done starting at age 28. Mammogram. This may be done every 1-2 years. Talk to your health care provider about how often you should have regular mammograms. Talk with your health care provider about your test results, treatment options, and if necessary, the need for more tests. Vaccines  Your health care provider may recommend certain vaccines, such as: Influenza vaccine. This is recommended every year. Tetanus, diphtheria, and acellular pertussis (Tdap, Td) vaccine. You may need a Td booster every 10 years. Zoster vaccine. You may need this after age 43. Pneumococcal 13-valent conjugate (PCV13) vaccine. One dose is recommended after age 86. Pneumococcal polysaccharide (PPSV23) vaccine. One dose is  recommended after age 38. Talk to your health care provider  about which screenings and vaccines you need and how often you need them. This information is not intended to replace advice given to you by your health care provider. Make sure you discuss any questions you have with your health care provider. Document Released: 03/10/2015 Document Revised: 11/01/2015 Document Reviewed: 12/13/2014 Elsevier Interactive Patient Education  2017 [place] Prevention in the Home Falls can cause injuries. They can happen to people of all ages. There are many things you can do to make your home safe and to help prevent falls. What can I do on the outside of my home? Regularly fix the edges of walkways and driveways and fix any cracks. Remove anything that might make you trip as you walk through a door, such as a raised step or threshold. Trim any bushes or trees on the path to your home. Use bright outdoor lighting. Clear any walking paths of anything that might make someone trip, such as rocks or tools. Regularly check to see if handrails are loose or broken. Make sure that both sides of any steps have handrails. Any raised decks and porches should have guardrails on the edges. Have any leaves, snow, or ice cleared regularly. Use sand or salt on walking paths during winter. Clean up any spills in your garage right away. This includes oil or grease spills. What can I do in the bathroom? Use night lights. Install grab bars by the toilet and in the tub and shower. Do not use towel bars as grab bars. Use non-skid mats or decals in the tub or shower. If you need to sit down in the shower, use a plastic, non-slip stool. Keep the floor dry. Clean up any water that spills on the floor as soon as it happens. Remove soap buildup in the tub or shower regularly. Attach bath mats securely with double-sided non-slip rug tape. Do not have throw rugs and other things on the floor that can make you trip. What can I do in the bedroom? Use night lights. Make sure  that you have a light by your bed that is easy to reach. Do not use any sheets or blankets that are too big for your bed. They should not hang down onto the floor. Have a firm chair that has side arms. You can use this for support while you get dressed. Do not have throw rugs and other things on the floor that can make you trip. What can I do in the kitchen? Clean up any spills right away. Avoid walking on wet floors. Keep items that you use a lot in easy-to-reach places. If you need to reach something above you, use a strong step stool that has a grab bar. Keep electrical cords out of the way. Do not use floor polish or wax that makes floors slippery. If you must use wax, use non-skid floor wax. Do not have throw rugs and other things on the floor that can make you trip. What can I do with my stairs? Do not leave any items on the stairs. Make sure that there are handrails on both sides of the stairs and use them. Fix handrails that are broken or loose. Make sure that handrails are as long as the stairways. Check any carpeting to make sure that it is firmly attached to the stairs. Fix any carpet that is loose or worn. Avoid having throw rugs at the top or bottom of the stairs. If  you do have throw rugs, attach them to the floor with carpet tape. Make sure that you have a light switch at the top of the stairs and the bottom of the stairs. If you do not have them, ask someone to add them for you. What else can I do to help prevent falls? Wear shoes that: Do not have high heels. Have rubber bottoms. Are comfortable and fit you well. Are closed at the toe. Do not wear sandals. If you use a stepladder: Make sure that it is fully opened. Do not climb a closed stepladder. Make sure that both sides of the stepladder are locked into place. Ask someone to hold it for you, if possible. Clearly mark and make sure that you can see: Any grab bars or handrails. First and last steps. Where the edge of  each step is. Use tools that help you move around (mobility aids) if they are needed. These include: Canes. Walkers. Scooters. Crutches. Turn on the lights when you go into a dark area. Replace any light bulbs as soon as they burn out. Set up your furniture so you have a clear path. Avoid moving your furniture around. If any of your floors are uneven, fix them. If there are any pets around you, be aware of where they are. Review your medicines with your doctor. Some medicines can make you feel dizzy. This can increase your chance of falling. Ask your doctor what other things that you can do to help prevent falls. This information is not intended to replace advice given to you by your health care provider. Make sure you discuss any questions you have with your health care provider. Document Released: 12/08/2008 Document Revised: 07/20/2015 Document Reviewed: 03/18/2014 Elsevier Interactive Patient Education  2017 [employer].

## 2022-05-21 NOTE — Progress Notes (Signed)
   This service is provided via telemedicine  No vital signs collected/recorded due to the encounter was a telemedicine visit.   Location of patient (ex: home, work):  Home  Patient consents to a telephone visit: Yes, see telephone visit dated 05/21/22  Location of the provider (ex: office, home):  [place], [place]   Name of any referring provider:  N/A  Names of all persons participating in the telemedicine service and their role in the encounter:  S.Chrae B/CMA, Sherrie Mustache, NP, and Patient   Time spent on call:  7 min with medical assistant

## 2022-05-21 NOTE — Progress Notes (Signed)
Subjective:   Lonny Widdowson is a 82 y.o. female who presents for Medicare Annual (Subsequent) preventive examination.  Review of Systems           Objective:    There were no vitals filed for this visit. There is no height or weight on file to calculate BMI.     05/21/2022   10:39 AM 10/08/2021   10:47 AM 05/15/2021    9:34 AM 04/09/2021   10:08 AM 10/02/2020    8:42 AM 06/16/2020   10:42 AM 12/02/2019    1:03 PM  Advanced Directives  Does Patient Have a Medical Advance Directive? No No No No No No No  Does patient want to make changes to medical advance directive?      No - Patient declined   Would patient like information on creating a medical advance directive? No - Patient declined No - Patient declined   No - Patient declined  No - Patient declined    Current Medications (verified) Outpatient Encounter Medications as of 05/21/2022  Medication Sig   Ascorbic Acid (VITAMIN C PO) 500 mg. 1/2 tablet twice a day   aspirin 81 MG tablet Take 81 mg by mouth at bedtime.   Calcium Carbonate (CALCIUM 600 PO) Take 1 tablet by mouth 2 (two) times daily.   Cholecalciferol (VITAMIN D3) 50 MCG (2000 UT) capsule Take 2,000 Units by mouth daily.   cloNIDine (CATAPRES) 0.1 MG tablet TAKE 1 TABLET BY MOUTH TWICE DAILY FOR BLOOD PRESSURE   clorazepate (TRANXENE) 7.5 MG tablet TAKE 1 TABLET(7.5 MG) BY MOUTH TWICE DAILY   docusate sodium (COLACE) 100 MG capsule Take 100 mg by mouth daily.   fexofenadine (ALLEGRA) 180 MG tablet Take 180 mg by mouth daily as needed for allergies.   furosemide (LASIX) 20 MG tablet Take 1 tablet (20 mg total) by mouth every other day.   losartan (COZAAR) 100 MG tablet TAKE 1 TABLET BY MOUTH EVERY DAY FOR BLOOD PRESSURE   sennosides-docusate sodium (SENOKOT-S) 8.6-50 MG tablet Take 1 tablet by mouth 2 (two) times daily.   simvastatin (ZOCOR) 40 MG tablet TAKE 1 TABLET BY MOUTH EVERY DAY FOR CHOLESTEROL   No facility-administered encounter medications on  file as of 05/21/2022.    Allergies (verified) Caffeine, Demerol [meperidine], Venlafaxine hcl, and Pseudoephedrine   History: Past Medical History:  Diagnosis Date   Abnormality of gait 07/11/2005   Allergy    Anxiety state, unspecified 04/08/2012   Asymptomatic varicose veins 09/24/2011   Cataract    bilateral   Chronic venous insufficiency 09/16/2012   DDD (degenerative disc disease), cervical    Degenerative and vascular disorders of ear, unspecified 08/25/2002   Depressive disorder, not elsewhere classified 11/10/2002   Diverticulosis of colon (without mention of hemorrhage) 08/25/1989   Edema 09/24/2011   Esophageal reflux 11/10/2002   pt denies reflux   First degree atrioventricular block 07/19/2008   Heart murmur    Hypertension    Inguinal hernia without mention of obstruction or gangrene, unilateral or unspecified, (not specified as recurrent) 11/28/2006   Insomnia, unspecified 12/15/2007   Leiomyoma of uterus, unspecified 05/27/2006   Leukocytopenia, unspecified 08/28/2010   Lumbago 07/22/2007   Obesity, unspecified 05/26/1996   Osteoarthrosis, unspecified whether generalized or localized, unspecified site 05/26/2004   Other abnormal blood chemistry 07/19/2008   Other and unspecified hyperlipidemia 10/24/2004   Palpitations 08/29/2007   Peptic ulcer, unspecified site, unspecified as acute or chronic, without mention of hemorrhage, perforation, or obstruction 05/27/2006  Scoliosis (and kyphoscoliosis), idiopathic 05/23/2009   Symptomatic menopausal or female climacteric states 05/28/1990   Unspecified essential hypertension 11/13/2004   Past Surgical History:  Procedure Laterality Date   CATARACT EXTRACTION  2022   CHOLECYSTECTOMY  1971   ENDOMETRIAL BIOPSY  03/1992   Dr. Mallie Mussel   FOOT SURGERY Left 04/2003   [facility]- Dr. Payton Mccallum HERNIA REPAIR Right 03/2007   Dr. Georgette Dover   KNEE ARTHROSCOPY Left 07/1997   Dr. Wonda Olds   KNEE SURGERY Right  03/02/2004   Dr. Berenice Primas   OOPHORECTOMY Right 1974   Dr. Amalia Hailey   THUMB ARTHROSCOPY Right 1995   Dr.Sypher   TONSILLECTOMY AND ADENOIDECTOMY  1975   TUBAL LIGATION     VESICOVAGINAL FISTULA CLOSURE W/ TAH  1975   Family History  Problem Relation Age of Onset   Kidney disease Mother    Hypertension Mother    Heart disease Father    Diabetes Father    Hypertension Sister    Atrial fibrillation Daughter    CVA Daughter    Drug abuse Son    Osteoporosis Daughter    Hypertension Daughter    Colon cancer Neg Hx    Colon polyps Neg Hx    Rectal cancer Neg Hx    Stomach cancer Neg Hx    Esophageal cancer Neg Hx    Social History   Socioeconomic History   Marital status: Widowed    Spouse name: Not on file   Number of children: Not on file   Years of education: Not on file   Highest education level: Not on file  Occupational History   Not on file  Tobacco Use   Smoking status: Some Days    Packs/day: 0.25    Years: 60.00    Additional pack years: 0.00    Total pack years: 15.00    Types: Cigarettes   Smokeless tobacco: Never   Tobacco comments:    5 a day   Vaping Use   Vaping Use: Never used  Substance and Sexual Activity   Alcohol use: No   Drug use: No   Sexual activity: Not on file  Other Topics Concern   Not on file  Social History Narrative   Widowed 2013   Smokes about 1/2 pack a week   Alcohol none   Exercise 5 days a week, treadmill, dance class, zumba class   Social Determinants of Radio broadcast assistant Strain: Not on file  Food Insecurity: Not on file  Transportation Needs: Not on file  Physical Activity: Not on file  Stress: Not on file  Social Connections: Not on file    Tobacco Counseling Ready to quit: Not Answered Counseling given: Not Answered Tobacco comments: 5 a day    Clinical Intake:  Pre-visit preparation completed: Yes  Pain : No/denies pain     BMI - recorded: 26 Nutritional Status: BMI 25 -29  Overweight Diabetes: No  How often do you need to have someone help you when you read instructions, pamphlets, or other written materials from your doctor or pharmacy?: 1 - Never  Diabetic?no         Activities of Daily Living    05/21/2022   11:00 AM  In your present state of health, do you have any difficulty performing the following activities:  Hearing? 0  Vision? 0  Difficulty concentrating or making decisions? 0  Walking or climbing stairs? 0  Dressing or bathing? 0  Doing errands, shopping?  0  Preparing Food and eating ? N  Using the Toilet? N  In the past six months, have you accidently leaked urine? N  Do you have problems with loss of bowel control? N  Managing your Medications? N  Managing your Finances? N  Housekeeping or managing your Housekeeping? N    Patient Care Team: Lauree Chandler, NP as PCP - General (Geriatric Medicine)  Indicate any recent Medical Services you may have received from other than [facility] providers in the past year (date may be approximate).     Assessment:   This is a routine wellness examination for [place].  Hearing/Vision screen Hearing Screening - Comments:: No hearing issues  Vision Screening - Comments:: Last eye exam greater than 12 months ago. Patient has an upcoming appointment with Dr.[facility], [place]   Dietary issues and exercise activities discussed:     Goals Addressed   None    Depression Screen    05/21/2022   10:37 AM 05/15/2021    9:28 AM 06/16/2020   10:42 AM 12/02/2019   12:59 PM 05/31/2019    1:31 PM 05/10/2019    2:44 PM 05/07/2018    1:27 PM  PHQ 2/9 Scores  PHQ - 2 Score 0 0 0 0 0 0 0    Fall Risk    05/21/2022   10:37 AM 04/15/2022   10:26 AM 10/08/2021   10:47 AM 05/15/2021    9:29 AM 04/09/2021   10:08 AM  Fall Risk   Falls in the past year? 0 0 0 0 0  Number falls in past yr: 0 0 0 0 0  Injury with Fall? 0 0 0 0 0  Risk for fall due to : No Fall Risks No Fall Risks No Fall Risks No Fall  Risks No Fall Risks  Follow up Falls evaluation completed Falls evaluation completed Falls evaluation completed Falls evaluation completed Falls evaluation completed    FALL RISK PREVENTION PERTAINING TO THE HOME:  Any stairs in or around the home? Yes  If so, are there any without handrails? No  Home free of loose throw rugs in walkways, pet beds, electrical cords, etc? Yes  Adequate lighting in your home to reduce risk of falls? Yes   ASSISTIVE DEVICES UTILIZED TO PREVENT FALLS:  Life alert? Yes  Use of a cane, walker or w/c? No  Grab bars in the bathroom? No  Shower chair or bench in shower? Yes  Elevated toilet seat or a handicapped toilet? Yes   TIMED UP AND GO:  Was the test performed? No .   Cognitive Function:    02/06/2016    3:05 PM 11/30/2013    3:33 PM  MMSE - Mini Mental State Exam  Orientation to time 5 5  Orientation to Place 5 5  Registration 3 3  Attention/ Calculation 5 3  Recall 3 3  Language- name 2 objects 2 2  Language- repeat 1 1  Language- follow 3 step command 3 3  Language- read & follow direction 1 1  Write a sentence 1 1  Copy design 1 1  Total score 30 28        05/21/2022   10:39 AM 05/15/2021    9:34 AM 05/10/2019    2:45 PM  6CIT Screen  What Year? 0 points 0 points 0 points  What month? 0 points 0 points 0 points  What time? 0 points 0 points 0 points  Count back from 20 4 points 0 points 0  points  Months in reverse 0 points 0 points 0 points  Repeat phrase 0 points 0 points 0 points  Total Score 4 points 0 points 0 points    Immunizations Immunization History  Administered Date(s) Administered   Covid-19, Mrna,Vaccine(Spikevax)14yrs and older 12/17/2021   Fluad Quad(high Dose 65+) 11/25/2018   Influenza, High Dose Seasonal PF 11/26/2016, 11/21/2017, 11/30/2019, 11/01/2021   Influenza-Unspecified 11/29/2009, 12/02/2012, 11/27/2013, 11/29/2014, 11/26/2015, 12/15/2020   PFIZER(Purple Top)SARS-COV-2 Vaccination 05/01/2019,  05/22/2019, 01/08/2020   Pfizer Covid-19 Vaccine Bivalent Booster 83yrs & up 12/17/2020   Pneumococcal Conjugate-13 04/16/2002, 11/04/2016   Pneumococcal Polysaccharide-23 05/07/2018   Rsv, Bivalent, Protein Subunit Rsvpref,pf Evans Lance) 01/04/2022   Td 01/16/1993   Tdap 03/06/2011   Zoster, Live 11/25/2013    TDAP status: Due, Education has been provided regarding the importance of this vaccine. Advised may receive this vaccine at local pharmacy or Health Dept. Aware to provide a copy of the vaccination record if obtained from local pharmacy or Health Dept. Verbalized acceptance and understanding.  Flu Vaccine status: Up to date  Pneumococcal vaccine status: Up to date  Covid-19 vaccine status: Information provided on how to obtain vaccines.   Qualifies for Shingles Vaccine? Yes   Zostavax completed No   Shingrix Completed?: No.    Education has been provided regarding the importance of this vaccine. Patient has been advised to call insurance company to determine out of pocket expense if they have not yet received this vaccine. Advised may also receive vaccine at local pharmacy or Health Dept. Verbalized acceptance and understanding.  Screening Tests Health Maintenance  Topic Date Due   Zoster Vaccines- Shingrix (1 of 2) Never done   DTaP/Tdap/Td (3 - Td or Tdap) 03/05/2021   Medicare Annual Wellness (AWV)  05/21/2023   Pneumonia Vaccine 52+ Years old  Completed   INFLUENZA VACCINE  Completed   DEXA SCAN  Completed   COVID-19 Vaccine  Completed   HPV VACCINES  Aged Out    Health Maintenance  Health Maintenance Due  Topic Date Due   Zoster Vaccines- Shingrix (1 of 2) Never done   DTaP/Tdap/Td (3 - Td or Tdap) 03/05/2021    Colorectal cancer screening: No longer required.   Mammogram status: No longer required due to aged out.  Bone Density status: Completed 10/26/22. Results reflect: Bone density results: OSTEOPENIA. Repeat every 2 years.  Lung Cancer Screening: (Low  Dose CT Chest recommended if Age 54-80 years, 30 pack-year currently smoking OR have quit w/in 15years.) does not qualify.   Lung Cancer Screening Referral: na  Additional Screening:  Hepatitis C Screening: does not qualify  Vision Screening: Recommended annual ophthalmology exams for early detection of glaucoma and other disorders of the eye. Is the patient up to date with their annual eye exam?  Yes  Who is the provider or what is the name of the office in which the patient attends annual eye exams? [facility] If pt is not established with a provider, would they like to be referred to a provider to establish care? Yes .   Dental Screening: Recommended annual dental exams for proper oral hygiene  Community Resource Referral / Chronic Care Management: CRR required this visit?  No   CCM required this visit?  No      Plan:     I have personally reviewed and noted the following in the patient's chart:   Medical and social history Use of alcohol, tobacco or illicit drugs  Current medications and supplements including opioid prescriptions. Patient  is not currently taking opioid prescriptions. Functional ability and status Nutritional status Physical activity Advanced directives List of other physicians Hospitalizations, surgeries, and ER visits in previous 12 months Vitals Screenings to include cognitive, depression, and falls Referrals and appointments  In addition, I have reviewed and discussed with patient certain preventive protocols, quality metrics, and best practice recommendations. A written personalized care plan for preventive services as well as general preventive health recommendations were provided to patient.     Lauree Chandler, NP   05/21/2022   Virtual Visit via Video Note  I connected with Hayden Rasmussen on 05/21/22 at 10:40 AM EDT by a video enabled telemedicine application and verified that I am speaking with the correct person using two  identifiers.  Location: Patient: home Provider: [facility]   I discussed the limitations of evaluation and management by telemedicine and the availability of in person appointments. The patient expressed understanding and agreed to proceed.    I discussed the assessment and treatment plan with the patient. The patient was provided an opportunity to ask questions and all were answered. The patient agreed with the plan and demonstrated an understanding of the instructions.   The patient was advised to call back or seek an in-person evaluation if the symptoms worsen or if the condition fails to improve as anticipated.  I provided 15 minutes of non-face-to-face time during this encounter.  Carlos American. Dewaine Oats, AGNP Avs printed and mailed.

## 2022-05-30 ENCOUNTER — Other Ambulatory Visit: Payer: Self-pay | Admitting: Nurse Practitioner

## 2022-05-30 DIAGNOSIS — I10 Essential (primary) hypertension: Secondary | ICD-10-CM

## 2022-06-12 ENCOUNTER — Other Ambulatory Visit: Payer: Self-pay | Admitting: Nurse Practitioner

## 2022-06-12 DIAGNOSIS — F418 Other specified anxiety disorders: Secondary | ICD-10-CM

## 2022-06-12 MED ORDER — CLORAZEPATE DIPOTASSIUM 7.5 MG PO TABS: 7.5000 mg | ORAL_TABLET | Freq: Two times a day (BID) | ORAL | 1 refills | Status: DC

## 2022-06-12 NOTE — Telephone Encounter (Signed)
RX is a controlled substance and needs approval by a provider

## 2022-06-12 NOTE — Addendum Note (Signed)
Addended by: Nelda Severe A on: 06/12/2022 04:17 PM   Modules accepted: Orders

## 2022-06-12 NOTE — Telephone Encounter (Signed)
Pharmacy requested refill.  Pended Rx and sent to Jessica for approval.  

## 2022-06-12 NOTE — Addendum Note (Signed)
Addended by: Maurice Small on: 06/12/2022 04:39 PM   Modules accepted: Orders

## 2022-06-12 NOTE — Telephone Encounter (Signed)
I sent rx to [facility], however she was unable to get the app to work that allows approval of controlled substances. RX nor redirected to [facility]

## 2022-06-13 MED ORDER — CLORAZEPATE DIPOTASSIUM 7.5 MG PO TABS: 7.5000 mg | ORAL_TABLET | Freq: Two times a day (BID) | ORAL | 1 refills | Status: DC

## 2022-07-30 ENCOUNTER — Other Ambulatory Visit: Payer: Self-pay | Admitting: Nurse Practitioner

## 2022-07-30 DIAGNOSIS — I10 Essential (primary) hypertension: Secondary | ICD-10-CM

## 2022-08-13 ENCOUNTER — Other Ambulatory Visit: Payer: Self-pay | Admitting: Nurse Practitioner

## 2022-08-13 DIAGNOSIS — F418 Other specified anxiety disorders: Secondary | ICD-10-CM

## 2022-08-13 NOTE — Telephone Encounter (Signed)
Patient is requesting refill on medication. Message routed to PCP Janyth Contes, Janene Harvey, NP

## 2022-08-21 IMAGING — DX DG ABD PORTABLE 1V
1 series · 1 of 1 positions shown · non-contrast
Comparison: CT abdomen and pelvis and acute abdomen series
yesterday.

CLINICAL DATA: Follow-up small bowel obstruction.

EXAM:
PORTABLE ABDOMEN - 1 VIEW

[abdomen kub]
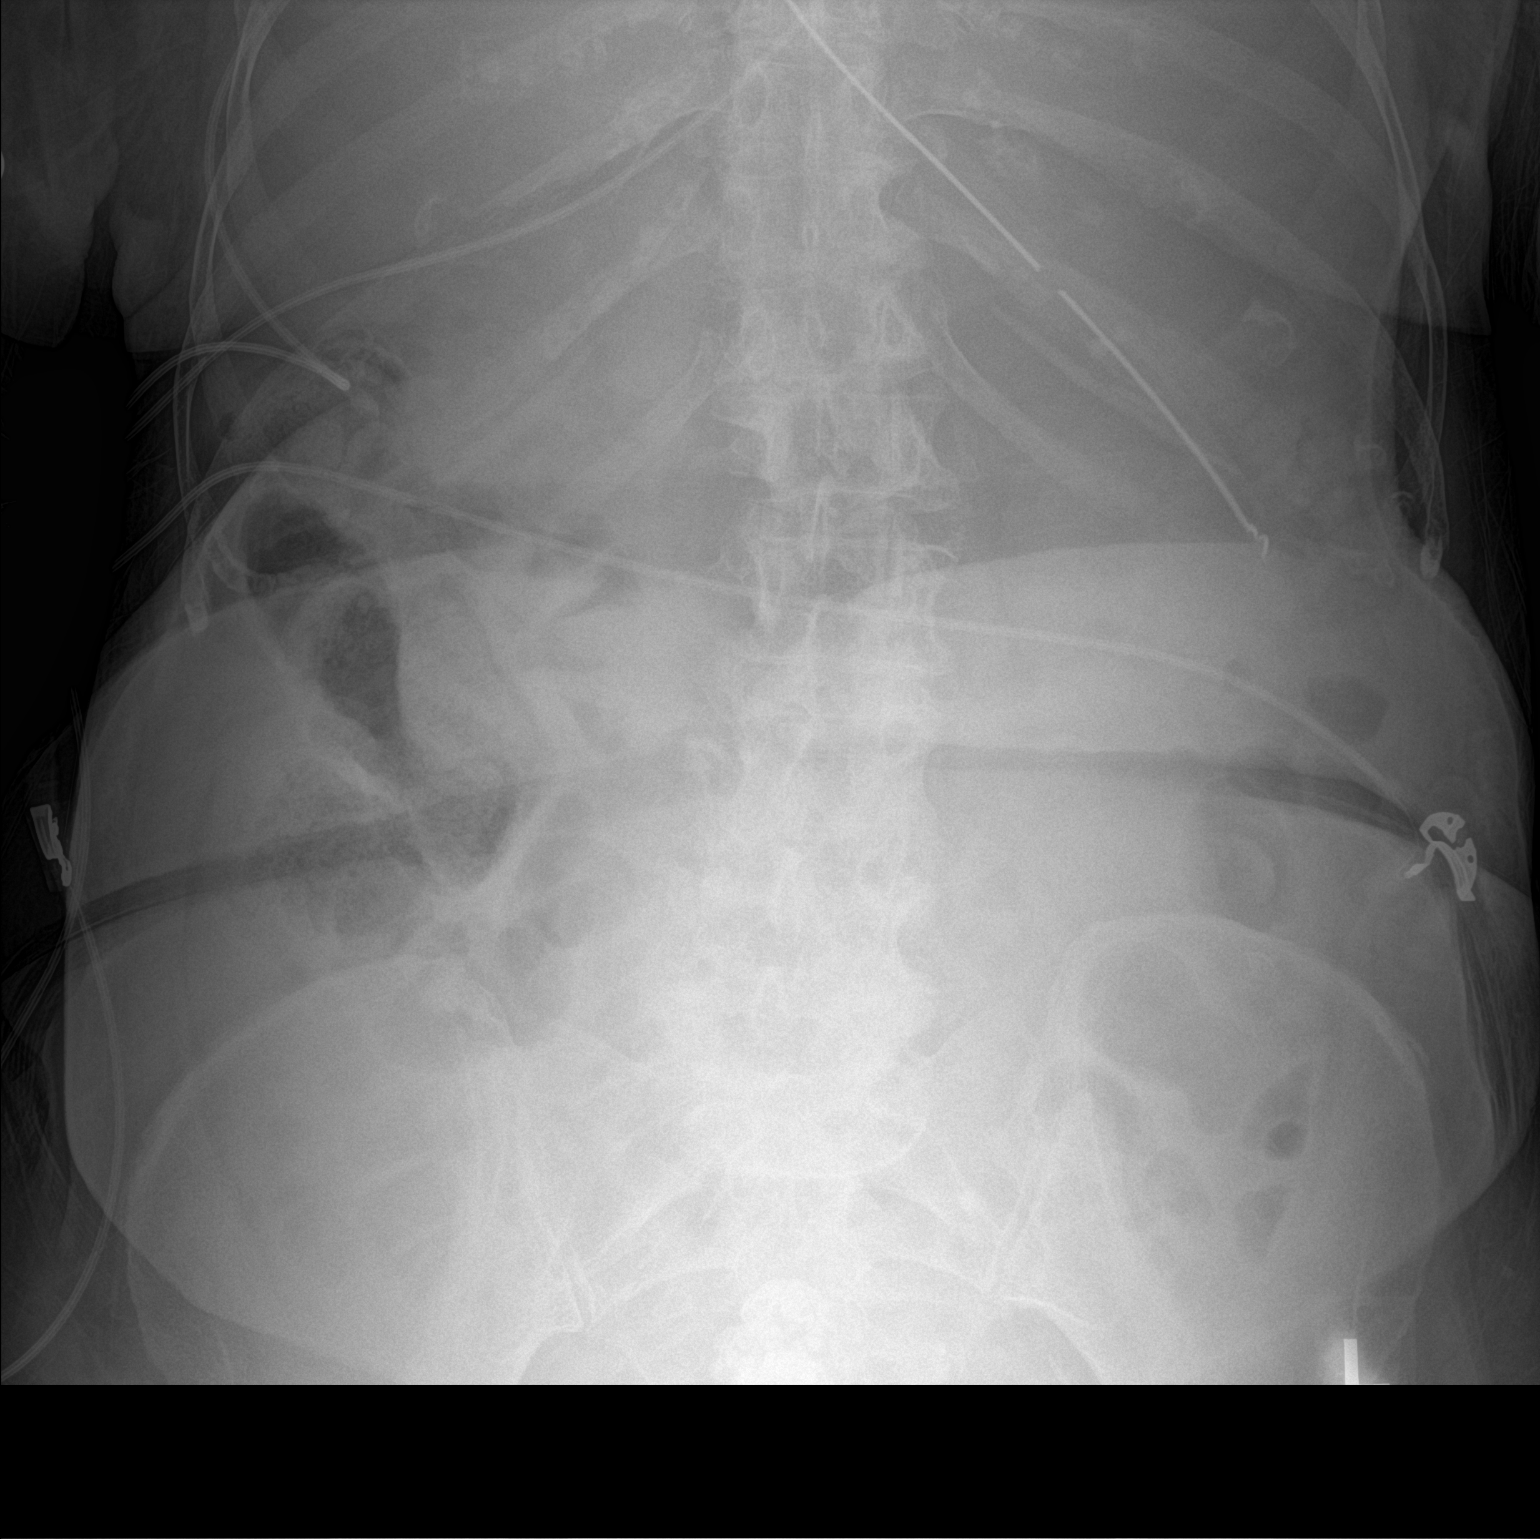

[1 of 1 positions shown; findings below may reference images not displayed]

FINDINGS: Nasogastric tube tip projects over the fundus of the stomach.
Persistent moderate distension of multiple loops of small bowel
throughout the abdomen, slightly improved since yesterday. Gas and
stool throughout normal caliber colon. No suggestion of free air on
the supine image. Numerous calcified uterine fibroids again noted.
IMPRESSION: 1. Slight improvement in the partial small bowel obstruction since
yesterday. No suggestion of free intraperitoneal air on the supine
image.
2. Nasogastric tube tip projects over the fundus of the stomach.

## 2022-10-14 ENCOUNTER — Encounter: Payer: Self-pay | Admitting: Nurse Practitioner

## 2022-10-14 ENCOUNTER — Ambulatory Visit (INDEPENDENT_AMBULATORY_CARE_PROVIDER_SITE_OTHER): Payer: Medicare HMO | Admitting: Nurse Practitioner

## 2022-10-14 VITALS — BP 132/84 | HR 67 | Temp 97.1°F | Ht 61.0 in | Wt 136.8 lb

## 2022-10-14 DIAGNOSIS — F17200 Nicotine dependence, unspecified, uncomplicated: Secondary | ICD-10-CM

## 2022-10-14 DIAGNOSIS — R600 Localized edema: Secondary | ICD-10-CM | POA: Diagnosis not present

## 2022-10-14 DIAGNOSIS — F411 Generalized anxiety disorder: Secondary | ICD-10-CM

## 2022-10-14 DIAGNOSIS — E785 Hyperlipidemia, unspecified: Secondary | ICD-10-CM

## 2022-10-14 DIAGNOSIS — I10 Essential (primary) hypertension: Secondary | ICD-10-CM

## 2022-10-14 DIAGNOSIS — F3341 Major depressive disorder, recurrent, in partial remission: Secondary | ICD-10-CM

## 2022-10-14 NOTE — Patient Instructions (Addendum)
To check blood pressure 1 hour AFTER you have had your medication Make sure you have been sitting at least 5 mins  To check once a week Record and let us know.  Goal <140/90  To get flu, COVID and RSV vaccine at pharmacy.

## 2022-10-14 NOTE — Progress Notes (Unsigned)
Careteam: Patient Care Team: Sharon Seller, NP as PCP - General (Geriatric Medicine)  PLACE OF SERVICE:  [facility]  Advanced Directive information Does Patient Have a Medical Advance Directive?: No, Would patient like information on creating a medical advance directive?: No - Patient declined  Allergies  Allergen Reactions   Caffeine Other (See Comments)    Rapid heart rate    Demerol [Meperidine]    Venlafaxine Hcl Other (See Comments)    Hyper   Pseudoephedrine Palpitations    Chief Complaint  Patient presents with   Medical Management of Chronic Issues    6 month follow-up. Discuss need for shingrix, td/tdap, covid booster and flu vaccine. [facility] verified. Discuss how often patient needs to check b/p at home and parameters.      HPI: Patient is a 82 y.o. female for routine follow up.   Overall doing well.  Occasionally will have depression but nothing overwhelming. Able to get through it.   Taking bp twice daily at home. Generally controlled.   No constipation or diarrhea.  No abnormal bleeding or bruising.   Continues to smoke, a pack last 3-4 days  Tries to eat right.   Review of Systems:  Review of Systems  Constitutional:  Negative for chills, fever and weight loss.  HENT:  Negative for tinnitus.   Respiratory:  Negative for cough, sputum production and shortness of breath.   Cardiovascular:  Negative for chest pain, palpitations and leg swelling.  Gastrointestinal:  Negative for abdominal pain, constipation, diarrhea and heartburn.  Genitourinary:  Negative for dysuria, frequency and urgency.  Musculoskeletal:  Negative for back pain, falls, joint pain and myalgias.  Skin: Negative.   Neurological:  Negative for dizziness and headaches.  Psychiatric/Behavioral:  Negative for depression and memory loss. The patient does not have insomnia.   ***  Past Medical History:  Diagnosis Date   Abnormality of gait 07/11/2005   Allergy    Anxiety state,  unspecified 04/08/2012   Asymptomatic varicose veins 09/24/2011   Cataract    bilateral   Chronic venous insufficiency 09/16/2012   DDD (degenerative disc disease), cervical    Degenerative and vascular disorders of ear, unspecified 08/25/2002   Depressive disorder, not elsewhere classified 11/10/2002   Diverticulosis of colon (without mention of hemorrhage) 08/25/1989   Edema 09/24/2011   Esophageal reflux 11/10/2002   pt denies reflux   First degree atrioventricular block 07/19/2008   Heart murmur    Hypertension    Inguinal hernia without mention of obstruction or gangrene, unilateral or unspecified, (not specified as recurrent) 11/28/2006   Insomnia, unspecified 12/15/2007   Leiomyoma of uterus, unspecified 05/27/2006   Leukocytopenia, unspecified 08/28/2010   Lumbago 07/22/2007   Obesity, unspecified 05/26/1996   Osteoarthrosis, unspecified whether generalized or localized, unspecified site 05/26/2004   Other abnormal blood chemistry 07/19/2008   Other and unspecified hyperlipidemia 10/24/2004   Palpitations 08/29/2007   Peptic ulcer, unspecified site, unspecified as acute or chronic, without mention of hemorrhage, perforation, or obstruction 05/27/2006   Scoliosis (and kyphoscoliosis), idiopathic 05/23/2009   Symptomatic menopausal or female climacteric states 05/28/1990   Unspecified essential hypertension 11/13/2004   Past Surgical History:  Procedure Laterality Date   CATARACT EXTRACTION  2022   CHOLECYSTECTOMY  1971   ENDOMETRIAL BIOPSY  03/1992   Dr. Dewaine Conger   FOOT SURGERY Left 04/2003   [facility]- Dr. Judith Blonder HERNIA REPAIR Right 03/2007   Dr. Corliss Skains   KNEE ARTHROSCOPY Left 07/1997  Dr. Meade Maw   KNEE SURGERY Right 03/02/2004   Dr. Luiz Blare   OOPHORECTOMY Right 1974   Dr. Logan Bores   THUMB ARTHROSCOPY Right 1995   Dr.Sypher   TONSILLECTOMY AND ADENOIDECTOMY  1975   TUBAL LIGATION     VESICOVAGINAL FISTULA CLOSURE W/ TAH  1975   Social History:    reports that she has been smoking cigarettes. She has a 15 pack-year smoking history. She has never used smokeless tobacco. She reports that she does not drink alcohol and does not use drugs.  Family History  Problem Relation Age of Onset   Kidney disease Mother    Hypertension Mother    Heart disease Father    Diabetes Father    Hypertension Sister    Atrial fibrillation Daughter    CVA Daughter    Drug abuse Son    Osteoporosis Daughter    Hypertension Daughter    Colon cancer Neg Hx    Colon polyps Neg Hx    Rectal cancer Neg Hx    Stomach cancer Neg Hx    Esophageal cancer Neg Hx     Medications: Patient's Medications  New Prescriptions   No medications on file  Previous Medications   ASCORBIC ACID (VITAMIN C PO)    500 mg. 1/2 tablet twice a day   ASPIRIN 81 MG TABLET    Take 81 mg by mouth at bedtime.   CALCIUM CARBONATE (CALCIUM 600 PO)    Take 1 tablet by mouth 2 (two) times daily.   CHOLECALCIFEROL (VITAMIN D3) 50 MCG (2000 UT) CAPSULE    Take 2,000 Units by mouth daily.   CLONIDINE (CATAPRES) 0.1 MG TABLET    TAKE 1 TABLET BY MOUTH TWICE DAILY FOR BLOOD PRESSURE   CLORAZEPATE (TRANXENE) 7.5 MG TABLET    TAKE 1 TABLET(7.5 MG) BY MOUTH TWICE DAILY   DOCUSATE SODIUM (COLACE) 100 MG CAPSULE    Take 100 mg by mouth daily.   FEXOFENADINE (ALLEGRA) 180 MG TABLET    Take 180 mg by mouth daily as needed for allergies.   FUROSEMIDE (LASIX) 20 MG TABLET    Take 1 tablet (20 mg total) by mouth every other day.   LOSARTAN (COZAAR) 100 MG TABLET    TAKE 1 TABLET BY MOUTH EVERY DAY FOR BLOOD PRESSURE   SENNOSIDES-DOCUSATE SODIUM (SENOKOT-S) 8.6-50 MG TABLET    Take 1 tablet by mouth 2 (two) times daily.   SIMVASTATIN (ZOCOR) 40 MG TABLET    TAKE 1 TABLET BY MOUTH EVERY DAY FOR CHOLESTEROL  Modified Medications   No medications on file  Discontinued Medications   No medications on file    Physical Exam:  Vitals:   10/14/22 0835 10/14/22 0838  BP: (!) 132/90 132/84  Pulse:  67   Temp: (!) 97.1 F (36.2 C)   TempSrc: Temporal   SpO2: 99%   Weight: 136 lb 12.8 oz (62.1 kg)   Height: 5\' 1"  (1.549 m)    Body mass index is 25.85 kg/m. Wt Readings from Last 3 Encounters:  10/14/22 136 lb 12.8 oz (62.1 kg)  04/15/22 138 lb 12.8 oz (63 kg)  10/08/21 138 lb (62.6 kg)    Physical Exam Constitutional:      General: She is not in acute distress.    Appearance: She is well-developed. She is not diaphoretic.  HENT:     Head: Normocephalic and atraumatic.     Mouth/Throat:     Pharynx: No oropharyngeal exudate.  Eyes:  Conjunctiva/sclera: Conjunctivae normal.     Pupils: Pupils are equal, round, and reactive to light.  Cardiovascular:     Rate and Rhythm: Normal rate and regular rhythm.     Heart sounds: Normal heart sounds.  Pulmonary:     Effort: Pulmonary effort is normal.     Breath sounds: Normal breath sounds.  Abdominal:     General: Bowel sounds are normal.     Palpations: Abdomen is soft.  Musculoskeletal:     Cervical back: Normal range of motion and neck supple.     Right lower leg: No edema.     Left lower leg: No edema.  Skin:    General: Skin is warm and dry.  Neurological:     General: No focal deficit present.     Mental Status: She is alert.  Psychiatric:        Mood and Affect: Mood normal.   ***  Labs reviewed: Basic Metabolic Panel: Recent Labs    04/15/22 1110  NA 136  K 4.3  CL 100  CO2 32  GLUCOSE 88  BUN 12  CREATININE 0.81  CALCIUM 8.9   Liver Function Tests: Recent Labs    04/15/22 1110  AST 15  ALT 15  BILITOT 0.5  PROT 6.4   No results for input(s): "LIPASE", "AMYLASE" in the last 8760 hours. No results for input(s): "AMMONIA" in the last 8760 hours. CBC: Recent Labs    04/15/22 1110  WBC 3.6*  NEUTROABS 1,706  HGB 12.1  HCT 35.8  MCV 93.2  PLT 161   Lipid Panel: No results for input(s): "CHOL", "HDL", "LDLCALC", "TRIG", "CHOLHDL", "LDLDIRECT" in the last 8760 hours. TSH: No results  for input(s): "TSH" in the last 8760 hours. A1C: Lab Results  Component Value Date   HGBA1C 5.3 12/02/2019     Assessment/Plan There are no diagnoses linked to this encounter.  No follow-ups on file.: ***  Bettie Swavely K. [facility] [phone]

## 2022-10-15 LAB — CBC WITH DIFFERENTIAL/PLATELET
Absolute Monocytes: 418 {cells}/uL (ref 200–950)
Basophils Absolute: 38 {cells}/uL (ref 0–200)
Basophils Relative: 1 %
Eosinophils Absolute: 228 {cells}/uL (ref 15–500)
Eosinophils Relative: 6 %
HCT: 38.7 % (ref 35.0–45.0)
Hemoglobin: 12.5 g/dL (ref 11.7–15.5)
Lymphs Abs: 1657 {cells}/uL (ref 850–3900)
MCH: 30.6 pg (ref 27.0–33.0)
MCHC: 32.3 g/dL (ref 32.0–36.0)
MCV: 94.9 fL (ref 80.0–100.0)
MPV: 10.8 fL (ref 7.5–12.5)
Monocytes Relative: 11 %
Neutro Abs: 1459 {cells}/uL — ABNORMAL LOW (ref 1500–7800)
Neutrophils Relative %: 38.4 %
Platelets: 171 10*3/uL (ref 140–400)
RBC: 4.08 10*6/uL (ref 3.80–5.10)
RDW: 11.6 % (ref 11.0–15.0)
Total Lymphocyte: 43.6 %
WBC: 3.8 10*3/uL (ref 3.8–10.8)

## 2022-10-15 LAB — COMPLETE METABOLIC PANEL WITH GFR
AG Ratio: 1.3 (calc) (ref 1.0–2.5)
ALT: 11 U/L (ref 6–29)
AST: 16 U/L (ref 10–35)
Albumin: 3.9 g/dL (ref 3.6–5.1)
Alkaline phosphatase (APISO): 61 U/L (ref 37–153)
BUN: 10 mg/dL (ref 7–25)
CO2: 31 mmol/L (ref 20–32)
Calcium: 9.4 mg/dL (ref 8.6–10.4)
Chloride: 98 mmol/L (ref 98–110)
Creat: 0.81 mg/dL (ref 0.60–0.95)
Globulin: 3 g/dL (ref 1.9–3.7)
Glucose, Bld: 83 mg/dL (ref 65–99)
Potassium: 4.3 mmol/L (ref 3.5–5.3)
Sodium: 137 mmol/L (ref 135–146)
Total Bilirubin: 0.5 mg/dL (ref 0.2–1.2)
Total Protein: 6.9 g/dL (ref 6.1–8.1)
eGFR: 73 mL/min/{1.73_m2} (ref >=60)

## 2022-10-15 LAB — LIPID PANEL
Cholesterol: 139 mg/dL (ref <200)
HDL: 62 mg/dL (ref >=50)
LDL Cholesterol (Calc): 64 mg/dL
Non-HDL Cholesterol (Calc): 77 mg/dL (ref <130)
Total CHOL/HDL Ratio: 2.2 (calc) (ref <5.0)
Triglycerides: 53 mg/dL (ref <150)

## 2022-10-31 ENCOUNTER — Other Ambulatory Visit: Payer: Self-pay | Admitting: Nurse Practitioner

## 2022-11-02 ENCOUNTER — Other Ambulatory Visit: Payer: Self-pay | Admitting: Nurse Practitioner

## 2022-11-02 DIAGNOSIS — I10 Essential (primary) hypertension: Secondary | ICD-10-CM

## 2022-11-13 ENCOUNTER — Other Ambulatory Visit: Payer: Self-pay | Admitting: Nurse Practitioner

## 2022-11-13 DIAGNOSIS — R600 Localized edema: Secondary | ICD-10-CM

## 2022-11-13 DIAGNOSIS — I10 Essential (primary) hypertension: Secondary | ICD-10-CM

## 2022-11-13 DIAGNOSIS — N179 Acute kidney failure, unspecified: Secondary | ICD-10-CM

## 2022-11-24 ENCOUNTER — Other Ambulatory Visit: Payer: Self-pay | Admitting: Nurse Practitioner

## 2022-11-24 DIAGNOSIS — I10 Essential (primary) hypertension: Secondary | ICD-10-CM

## 2023-01-13 ENCOUNTER — Other Ambulatory Visit: Payer: Self-pay | Admitting: Nurse Practitioner

## 2023-01-13 DIAGNOSIS — F418 Other specified anxiety disorders: Secondary | ICD-10-CM

## 2023-01-14 NOTE — Telephone Encounter (Signed)
Patient is requesting a refill of the following medications: Requested Prescriptions   Pending Prescriptions Disp Refills   clorazepate (TRANXENE) 7.5 MG tablet [Pharmacy Med Name: CLORAZEPATE 7.5MG  TABLETS] 60 tablet 5    Sig: TAKE 1 TABLET(7.5 MG) BY MOUTH TWICE DAILY    Date of last refill: 08/13/22  Refill amount: # 60, 4 refills    Treatment agreement date: August 2023, notation made to update on pending appointment on 04/14/23

## 2023-01-28 ENCOUNTER — Other Ambulatory Visit: Payer: Self-pay | Admitting: Nurse Practitioner

## 2023-01-28 DIAGNOSIS — I10 Essential (primary) hypertension: Secondary | ICD-10-CM

## 2023-01-30 ENCOUNTER — Other Ambulatory Visit: Payer: Self-pay | Admitting: Nurse Practitioner

## 2023-04-14 ENCOUNTER — Telehealth (INDEPENDENT_AMBULATORY_CARE_PROVIDER_SITE_OTHER): Payer: Medicare HMO | Admitting: Nurse Practitioner

## 2023-04-14 VITALS — Wt 133.6 lb

## 2023-04-14 DIAGNOSIS — F3340 Major depressive disorder, recurrent, in remission, unspecified: Secondary | ICD-10-CM

## 2023-04-14 DIAGNOSIS — M8589 Other specified disorders of bone density and structure, multiple sites: Secondary | ICD-10-CM

## 2023-04-14 DIAGNOSIS — K5900 Constipation, unspecified: Secondary | ICD-10-CM

## 2023-04-14 DIAGNOSIS — F32A Depression, unspecified: Secondary | ICD-10-CM

## 2023-04-14 DIAGNOSIS — F419 Anxiety disorder, unspecified: Secondary | ICD-10-CM

## 2023-04-14 DIAGNOSIS — E785 Hyperlipidemia, unspecified: Secondary | ICD-10-CM

## 2023-04-14 DIAGNOSIS — F411 Generalized anxiety disorder: Secondary | ICD-10-CM

## 2023-04-14 DIAGNOSIS — I10 Essential (primary) hypertension: Secondary | ICD-10-CM

## 2023-04-14 DIAGNOSIS — K5901 Slow transit constipation: Secondary | ICD-10-CM

## 2023-04-14 DIAGNOSIS — R600 Localized edema: Secondary | ICD-10-CM | POA: Insufficient documentation

## 2023-04-14 MED ORDER — CLORAZEPATE DIPOTASSIUM 3.75 MG PO TABS
3.7500 mg | ORAL_TABLET | Freq: Two times a day (BID) | ORAL | 1 refills | Status: DC
Start: 2023-04-14 — End: 2023-06-24

## 2023-04-14 NOTE — Progress Notes (Signed)
 Careteam: Patient Care Team: Sharon Seller, NP as PCP - General (Geriatric Medicine)  Advanced Directive information Does Patient Have a Medical Advance Directive?: Yes, Type of Advance Directive: Healthcare Power of Attorney, Does patient want to make changes to medical advance directive?: No - Patient declined  Allergies  Allergen Reactions   Caffeine Other (See Comments)    Rapid heart rate    Demerol [Meperidine]    Venlafaxine Hcl Other (See Comments)    Hyper   Pseudoephedrine Palpitations    Chief Complaint  Patient presents with   Medical Management of Chronic Issues    Patient is being seen for a 6 month follow up. Patient has no other concerns.   Immunizations    Patient is due for shingles vaccine      HPI: Patient is a 83 y.o. female for routine follow up via mychart video visit.  Discussed the use of AI scribe software for clinical note transcription with the patient, who gave verbal consent to proceed.  History of Present Illness   Felicia Sawyer is an 83 year old female who presents for a routine follow-up.  She has been monitoring her blood pressure at home, with readings ranging from 120/60 to 130/70 mmHg. Her current medications include clonidine 0.1 mg twice daily, Lasix every other day, and losartan 100 mg daily. She adheres to a low sodium diet.  For cholesterol management, she is on simvastatin daily. Her cholesterol levels were satisfactory at the last check, and she is scheduled to have her cholesterol panel repeated in August.  She experiences lower extremity edema, which is managed with Lasix, compression stockings, and leg elevation.  Constipation is managed with Sennosides twice daily and Colace, effectively controlling her symptoms.  For osteopenia, she takes calcium and vitamin D supplements. She mentions limited walking due to safety concerns in her neighborhood but uses a treadmill at home. Occasionally, she walks outside  when the weather is nice, though family discourages this due to safety concerns.  She experiences anxiety and takes clorazepate (Tranxene) twice daily. She has tried reducing the dose to half a tablet twice daily, which she reports worked okay. She lives alone but receives regular visits and support from her daughters, who ensure she is well-fed and cared for. She enjoys eating vegetables and prefers [place] and chicken over pork.  No significant depression, stating that she manages well living alone with support from her family.      Review of Systems:  Review of Systems  Constitutional:  Negative for chills, fever and weight loss.  HENT:  Negative for tinnitus.   Respiratory:  Negative for cough, sputum production and shortness of breath.   Cardiovascular:  Negative for chest pain, palpitations and leg swelling.  Gastrointestinal:  Negative for abdominal pain, constipation, diarrhea and heartburn.  Genitourinary:  Negative for dysuria, frequency and urgency.  Musculoskeletal:  Negative for back pain, falls, joint pain and myalgias.  Skin: Negative.   Neurological:  Negative for dizziness and headaches.  Psychiatric/Behavioral:  Negative for depression and memory loss. The patient does not have insomnia.     Past Medical History:  Diagnosis Date   Abnormality of gait 07/11/2005   Allergy    Anxiety state, unspecified 04/08/2012   Asymptomatic varicose veins 09/24/2011   Cataract    bilateral   Chronic venous insufficiency 09/16/2012   DDD (degenerative disc disease), cervical    Degenerative and vascular disorders of ear, unspecified 08/25/2002   Depressive disorder, not elsewhere  classified 11/10/2002   Diverticulosis of colon (without mention of hemorrhage) 08/25/1989   Edema 09/24/2011   Esophageal reflux 11/10/2002   pt denies reflux   First degree atrioventricular block 07/19/2008   Heart murmur    Hypertension    Inguinal hernia without mention of obstruction or gangrene,  unilateral or unspecified, (not specified as recurrent) 11/28/2006   Insomnia, unspecified 12/15/2007   Leiomyoma of uterus, unspecified 05/27/2006   Leukocytopenia, unspecified 08/28/2010   Lumbago 07/22/2007   Obesity, unspecified 05/26/1996   Osteoarthrosis, unspecified whether generalized or localized, unspecified site 05/26/2004   Other abnormal blood chemistry 07/19/2008   Other and unspecified hyperlipidemia 10/24/2004   Palpitations 08/29/2007   Peptic ulcer, unspecified site, unspecified as acute or chronic, without mention of hemorrhage, perforation, or obstruction 05/27/2006   Scoliosis (and kyphoscoliosis), idiopathic 05/23/2009   Symptomatic menopausal or female climacteric states 05/28/1990   Unspecified essential hypertension 11/13/2004   Past Surgical History:  Procedure Laterality Date   CATARACT EXTRACTION  2022   CHOLECYSTECTOMY  1971   ENDOMETRIAL BIOPSY  03/1992   Dr. Dewaine Conger   FOOT SURGERY Left 04/2003   [facility]- Dr. Judith Blonder HERNIA REPAIR Right 03/2007   Dr. Corliss Skains   KNEE ARTHROSCOPY Left 07/1997   Dr. Meade Maw   KNEE SURGERY Right 03/02/2004   Dr. Luiz Blare   OOPHORECTOMY Right 1974   Dr. Logan Bores   THUMB ARTHROSCOPY Right 1995   Dr.Sypher   TONSILLECTOMY AND ADENOIDECTOMY  1975   TUBAL LIGATION     VESICOVAGINAL FISTULA CLOSURE W/ TAH  1975   Social History:   reports that she has been smoking cigarettes. She has a 15 pack-year smoking history. She has never used smokeless tobacco. She reports that she does not drink alcohol and does not use drugs.  Family History  Problem Relation Age of Onset   Kidney disease Mother    Hypertension Mother    Heart disease Father    Diabetes Father    Hypertension Sister    Atrial fibrillation Daughter    CVA Daughter    Drug abuse Son    Osteoporosis Daughter    Hypertension Daughter    Colon cancer Neg Hx    Colon polyps Neg Hx    Rectal cancer Neg Hx    Stomach cancer Neg Hx    Esophageal  cancer Neg Hx     Medications: Patient's Medications  New Prescriptions   No medications on file  Previous Medications   ASCORBIC ACID (VITAMIN C PO)    500 mg. 1/2 tablet twice a day   ASPIRIN 81 MG TABLET    Take 81 mg by mouth at bedtime.   CALCIUM CARBONATE (CALCIUM 600 PO)    Take 1 tablet by mouth 2 (two) times daily.   CHOLECALCIFEROL (VITAMIN D3) 50 MCG (2000 UT) CAPSULE    Take 2,000 Units by mouth daily.   CLONIDINE (CATAPRES) 0.1 MG TABLET    TAKE 1 TABLET BY MOUTH TWICE DAILY FOR BLOOD PRESSURE   DOCUSATE SODIUM (COLACE) 100 MG CAPSULE    Take 100 mg by mouth daily.   FEXOFENADINE (ALLEGRA) 180 MG TABLET    Take 180 mg by mouth daily as needed for allergies.   FUROSEMIDE (LASIX) 20 MG TABLET    TAKE 1 TABLET(20 MG) BY MOUTH EVERY OTHER DAY   LOSARTAN (COZAAR) 100 MG TABLET    TAKE 1 TABLET BY MOUTH EVERY DAY FOR BLOOD PRESSURE   SENNOSIDES-DOCUSATE SODIUM (SENOKOT-S) 8.6-50 MG  TABLET    Take 1 tablet by mouth 2 (two) times daily.   SIMVASTATIN (ZOCOR) 40 MG TABLET    TAKE 1 TABLET BY MOUTH EVERY DAY FOR CHOLESTEROL  Modified Medications   Modified Medication Previous Medication   CLORAZEPATE (TRANXENE) 3.75 MG TABLET clorazepate (TRANXENE) 7.5 MG tablet      Take 1 tablet (3.75 mg total) by mouth 2 (two) times daily.    TAKE 1 TABLET(7.5 MG) BY MOUTH TWICE DAILY  Discontinued Medications   No medications on file    Physical Exam:  Vitals:   04/14/23 0824  Weight: 133 lb 9.6 oz (60.6 kg)   Body mass index is 25.24 kg/m. Wt Readings from Last 3 Encounters:  04/14/23 133 lb 9.6 oz (60.6 kg)  10/14/22 136 lb 12.8 oz (62.1 kg)  04/15/22 138 lb 12.8 oz (63 kg)    Physical Exam Constitutional:      Appearance: Normal appearance.  Pulmonary:     Effort: Pulmonary effort is normal.  Neurological:     Mental Status: She is alert. Mental status is at baseline.  Psychiatric:        Mood and Affect: Mood normal.     Labs reviewed: Basic Metabolic Panel: Recent  Labs    04/15/22 1110 10/14/22 0912  NA 136 137  K 4.3 4.3  CL 100 98  CO2 32 31  GLUCOSE 88 83  BUN 12 10  CREATININE 0.81 0.81  CALCIUM 8.9 9.4   Liver Function Tests: Recent Labs    04/15/22 1110 10/14/22 0912  AST 15 16  ALT 15 11  BILITOT 0.5 0.5  PROT 6.4 6.9   No results for input(s): "LIPASE", "AMYLASE" in the last 8760 hours. No results for input(s): "AMMONIA" in the last 8760 hours. CBC: Recent Labs    04/15/22 1110 10/14/22 0912  WBC 3.6* 3.8  NEUTROABS 1,706 1,459*  HGB 12.1 12.5  HCT 35.8 38.7  MCV 93.2 94.9  PLT 161 171   Lipid Panel: Recent Labs    10/14/22 0912  CHOL 139  HDL 62  LDLCALC 64  TRIG 53  CHOLHDL 2.2   TSH: No results for input(s): "TSH" in the last 8760 hours. A1C: Lab Results  Component Value Date   HGBA1C 5.3 12/02/2019     Assessment/Plan Assessment and Plan    Hypertension Well controlled with home readings of 120/60 and 130/70. Currently on Clonidine 0.1mg  BID, Lasix every other day, and Losartan 100mg  daily. -Continue current medications. -Continue low sodium diet. -cbc, cmp to be drawn  Hyperlipidemia Last cholesterol panel was normal. Currently on Simvastatin daily. -Continue Simvastatin. -Next cholesterol panel due in August.  Constipation Controlled with Senna and Colace daily. -Continue current regimen.  Lower extremity edema Managed with Lasix, compression stockings, and leg elevation. -Continue current regimen.  Osteopenia On Calcium and Vitamin D. Limited weight-bearing exercise due to safety concerns. -Encourage use of treadmill for weight-bearing exercise.  Anxiety On Clorazepate (Tranxene) 7.5mg  twice daily. Discussed potential side effects and benefits of reducing dose. -Reduce Clorazepate to 3.75mg  twice daily. -Continue monitoring for anxiety symptoms.  Depression Controlled, not currently on medication   General Health Maintenance / Followup Plans -Schedule lab  work. -Follow-up appointment in 6 months.        Next appt: 6 months, to also schedule lab work this week to be completed in office.  Janene Harvey. [facility]  [facility] [phone]    Virtual Visit via video  I  connected with patient on 04/14/23 at  8:40 AM EST by mychart and verified that I am speaking with the correct person using two identifiers.  Location: Patient: home Provider: [facility]   I discussed the limitations, risks, security and privacy concerns of performing an evaluation and management service by telephone and the availability of in person appointments. I also discussed with the patient that there may be a patient responsible charge related to this service. The patient expressed understanding and agreed to proceed.   I discussed the assessment and treatment plan with the patient. The patient was provided an opportunity to ask questions and all were answered. The patient agreed with the plan and demonstrated an understanding of the instructions.   The patient was advised to call back or seek an in-person evaluation if the symptoms worsen or if the condition fails to improve as anticipated.  I provided 25 minutes of non-face-to-face time during this encounter.  Janene Harvey. [facility] printed and mailed

## 2023-04-16 ENCOUNTER — Other Ambulatory Visit: Payer: Self-pay | Admitting: Nurse Practitioner

## 2023-04-16 ENCOUNTER — Other Ambulatory Visit: Payer: Medicare HMO

## 2023-04-16 DIAGNOSIS — I10 Essential (primary) hypertension: Secondary | ICD-10-CM

## 2023-04-16 DIAGNOSIS — E785 Hyperlipidemia, unspecified: Secondary | ICD-10-CM

## 2023-04-17 ENCOUNTER — Encounter: Payer: Self-pay | Admitting: Nurse Practitioner

## 2023-04-17 LAB — CBC WITH DIFFERENTIAL/PLATELET
Absolute Lymphocytes: 714 {cells}/uL — ABNORMAL LOW (ref 850–3900)
Absolute Monocytes: 836 {cells}/uL (ref 200–950)
Basophils Absolute: 29 {cells}/uL (ref 0–200)
Basophils Relative: 0.7 %
Eosinophils Absolute: 80 {cells}/uL (ref 15–500)
Eosinophils Relative: 1.9 %
HCT: 37.4 % (ref 35.0–45.0)
Hemoglobin: 11.9 g/dL (ref 11.7–15.5)
MCH: 30.1 pg (ref 27.0–33.0)
MCHC: 31.8 g/dL — ABNORMAL LOW (ref 32.0–36.0)
MCV: 94.4 fL (ref 80.0–100.0)
MPV: 10.7 fL (ref 7.5–12.5)
Monocytes Relative: 19.9 %
Neutro Abs: 2541 {cells}/uL (ref 1500–7800)
Neutrophils Relative %: 60.5 %
Platelets: 212 10*3/uL (ref 140–400)
RBC: 3.96 10*6/uL (ref 3.80–5.10)
RDW: 11.8 % (ref 11.0–15.0)
Total Lymphocyte: 17 %
WBC: 4.2 10*3/uL (ref 3.8–10.8)

## 2023-04-17 LAB — COMPREHENSIVE METABOLIC PANEL
AG Ratio: 1.4 (calc) (ref 1.0–2.5)
ALT: 7 U/L (ref 6–29)
AST: 13 U/L (ref 10–35)
Albumin: 3.9 g/dL (ref 3.6–5.1)
Alkaline phosphatase (APISO): 67 U/L (ref 37–153)
BUN: 10 mg/dL (ref 7–25)
CO2: 30 mmol/L (ref 20–32)
Calcium: 9.3 mg/dL (ref 8.6–10.4)
Chloride: 97 mmol/L — ABNORMAL LOW (ref 98–110)
Creat: 0.8 mg/dL (ref 0.60–0.95)
Globulin: 2.8 g/dL (ref 1.9–3.7)
Glucose, Bld: 103 mg/dL — ABNORMAL HIGH (ref 65–99)
Potassium: 4.2 mmol/L (ref 3.5–5.3)
Sodium: 133 mmol/L — ABNORMAL LOW (ref 135–146)
Total Bilirubin: 0.4 mg/dL (ref 0.2–1.2)
Total Protein: 6.7 g/dL (ref 6.1–8.1)

## 2023-04-26 ENCOUNTER — Other Ambulatory Visit: Payer: Self-pay | Admitting: Nurse Practitioner

## 2023-04-26 DIAGNOSIS — R600 Localized edema: Secondary | ICD-10-CM

## 2023-04-26 DIAGNOSIS — N179 Acute kidney failure, unspecified: Secondary | ICD-10-CM

## 2023-04-26 DIAGNOSIS — I10 Essential (primary) hypertension: Secondary | ICD-10-CM

## 2023-05-29 ENCOUNTER — Ambulatory Visit (INDEPENDENT_AMBULATORY_CARE_PROVIDER_SITE_OTHER): Payer: Medicare HMO | Admitting: Nurse Practitioner

## 2023-05-29 ENCOUNTER — Encounter: Payer: Self-pay | Admitting: Nurse Practitioner

## 2023-05-29 DIAGNOSIS — E2839 Other primary ovarian failure: Secondary | ICD-10-CM

## 2023-05-29 DIAGNOSIS — Z0000 Encounter for general adult medical examination without abnormal findings: Secondary | ICD-10-CM | POA: Diagnosis not present

## 2023-05-29 NOTE — Patient Instructions (Signed)
  Felicia Sawyer , Thank you for taking time to come for your Medicare Wellness Visit. I appreciate your ongoing commitment to your health goals. Please review the following plan we discussed and let me know if I can assist you in the future.   To make appt with [facility] for bone density- due in september 2025 To get shingle vaccine and TDAP at local pharmacy    This is a list of the screening recommended for you and due dates:  Health Maintenance  Topic Date Due   Zoster (Shingles) Vaccine (1 of 2) 05/15/1992   DTaP/Tdap/Td vaccine (3 - Td or Tdap) 04/13/2024*   COVID-19 Vaccine (7 - 2024-25 season) 06/18/2023   Flu Shot  09/26/2023   Medicare Annual Wellness Visit  05/28/2024   Pneumonia Vaccine  Completed   DEXA scan (bone density measurement)  Completed   HPV Vaccine  Aged Out   Hepatitis C Screening  Discontinued  *Topic was postponed. The date shown is not the original due date.

## 2023-05-29 NOTE — Progress Notes (Signed)
 Subjective:   Felicia Sawyer is a 83 y.o. female who presents for Medicare Annual (Subsequent) preventive examination.  Visit Complete: Virtual I connected with  Candis Musa on 05/29/23 by a video and audio enabled telemedicine application and verified that I am speaking with the correct person using two identifiers.  Patient Location: Home  Provider Location: Office/Clinic  I discussed the limitations of evaluation and management by telemedicine. The patient expressed understanding and agreed to proceed.  Vital Signs: Because this visit was a virtual/telehealth visit, some criteria may be missing or patient reported. Any vitals not documented were not able to be obtained and vitals that have been documented are patient reported.   Cardiac Risk Factors include: advanced age (>7men, >7 women)     Objective:    There were no vitals filed for this visit. There is no height or weight on file to calculate BMI.     05/29/2023    9:57 AM 04/14/2023    8:27 AM 10/14/2022    8:35 AM 05/21/2022   10:39 AM 10/08/2021   10:47 AM 05/15/2021    9:34 AM 04/09/2021   10:08 AM  Advanced Directives  Does Patient Have a Medical Advance Directive? No Yes No No No No No  Type of Advance Directive Healthcare Power of [employer] Power of Attorney       Does patient want to make changes to medical advance directive? No - Patient declined No - Patient declined       Copy of Healthcare Power of Attorney in Chart? No - copy requested No - copy requested       Would patient like information on creating a medical advance directive?   No - Patient declined No - Patient declined No - Patient declined      Current Medications (verified) Outpatient Encounter Medications as of 05/29/2023  Medication Sig   Ascorbic Acid (VITAMIN C PO) 500 mg. 1/2 tablet twice a day   aspirin 81 MG tablet Take 81 mg by mouth at bedtime.   Calcium Carbonate (CALCIUM 600 PO) Take 1 tablet by mouth 2  (two) times daily.   Cholecalciferol (VITAMIN D3) 50 MCG (2000 UT) capsule Take 2,000 Units by mouth daily.   cloNIDine (CATAPRES) 0.1 MG tablet TAKE 1 TABLET BY MOUTH TWICE DAILY FOR BLOOD PRESSURE   clorazepate (TRANXENE) 3.75 MG tablet Take 1 tablet (3.75 mg total) by mouth 2 (two) times daily.   docusate sodium (COLACE) 100 MG capsule Take 100 mg by mouth daily.   fexofenadine (ALLEGRA) 180 MG tablet Take 180 mg by mouth daily as needed for allergies.   furosemide (LASIX) 20 MG tablet TAKE 1 TABLET(20 MG) BY MOUTH EVERY OTHER DAY   losartan (COZAAR) 100 MG tablet TAKE 1 TABLET BY MOUTH EVERY DAY FOR BLOOD PRESSURE   sennosides-docusate sodium (SENOKOT-S) 8.6-50 MG tablet Take 1 tablet by mouth 2 (two) times daily.   simvastatin (ZOCOR) 40 MG tablet TAKE 1 TABLET BY MOUTH EVERY DAY FOR CHOLESTEROL   No facility-administered encounter medications on file as of 05/29/2023.    Allergies (verified) Caffeine, Demerol [meperidine], Venlafaxine hcl, and Pseudoephedrine   History: Past Medical History:  Diagnosis Date   Abnormality of gait 07/11/2005   Allergy    Anxiety state, unspecified 04/08/2012   Asymptomatic varicose veins 09/24/2011   Cataract    bilateral   Chronic venous insufficiency 09/16/2012   DDD (degenerative disc disease), cervical    Degenerative and vascular disorders of ear, unspecified  08/25/2002   Depressive disorder, not elsewhere classified 11/10/2002   Diverticulosis of colon (without mention of hemorrhage) 08/25/1989   Edema 09/24/2011   Esophageal reflux 11/10/2002   pt denies reflux   First degree atrioventricular block 07/19/2008   Heart murmur    Hypertension    Inguinal hernia without mention of obstruction or gangrene, unilateral or unspecified, (not specified as recurrent) 11/28/2006   Insomnia, unspecified 12/15/2007   Leiomyoma of uterus, unspecified 05/27/2006   Leukocytopenia, unspecified 08/28/2010   Lumbago 07/22/2007   Obesity, unspecified  05/26/1996   Osteoarthrosis, unspecified whether generalized or localized, unspecified site 05/26/2004   Other abnormal blood chemistry 07/19/2008   Other and unspecified hyperlipidemia 10/24/2004   Palpitations 08/29/2007   Peptic ulcer, unspecified site, unspecified as acute or chronic, without mention of hemorrhage, perforation, or obstruction 05/27/2006   Scoliosis (and kyphoscoliosis), idiopathic 05/23/2009   Symptomatic menopausal or female climacteric states 05/28/1990   Unspecified essential hypertension 11/13/2004   Past Surgical History:  Procedure Laterality Date   CATARACT EXTRACTION  2022   CHOLECYSTECTOMY  1971   ENDOMETRIAL BIOPSY  03/1992   Dr. Dewaine Conger   FOOT SURGERY Left 04/2003   [facility]- Dr. Judith Blonder HERNIA REPAIR Right 03/2007   Dr. Corliss Skains   KNEE ARTHROSCOPY Left 07/1997   Dr. Meade Maw   KNEE SURGERY Right 03/02/2004   Dr. Luiz Blare   OOPHORECTOMY Right 1974   Dr. Logan Bores   THUMB ARTHROSCOPY Right 1995   Dr.Sypher   TONSILLECTOMY AND ADENOIDECTOMY  1975   TUBAL LIGATION     VESICOVAGINAL FISTULA CLOSURE W/ TAH  1975   Family History  Problem Relation Age of Onset   Kidney disease Mother    Hypertension Mother    Heart disease Father    Diabetes Father    Hypertension Sister    Atrial fibrillation Daughter    CVA Daughter    Drug abuse Son    Osteoporosis Daughter    Hypertension Daughter    Colon cancer Neg Hx    Colon polyps Neg Hx    Rectal cancer Neg Hx    Stomach cancer Neg Hx    Esophageal cancer Neg Hx    Social History   Socioeconomic History   Marital status: Widowed    Spouse name: Not on file   Number of children: Not on file   Years of education: Not on file   Highest education level: Not on file  Occupational History   Not on file  Tobacco Use   Smoking status: Some Days    Current packs/day: 0.25    Average packs/day: 0.3 packs/day for 60.0 years (15.0 ttl pk-yrs)    Types: Cigarettes   Smokeless tobacco: Never    Tobacco comments:    5 a day   Vaping Use   Vaping status: Never Used  Substance and Sexual Activity   Alcohol use: No   Drug use: No   Sexual activity: Not on file  Other Topics Concern   Not on file  Social History Narrative   Widowed 2013   Smokes about 1/2 pack a week   Alcohol none   Exercise 5 days a week, treadmill, dance class, zumba class   Social Drivers of Corporate investment banker Strain: Not on file  Food Insecurity: No Food Insecurity (05/29/2023)   Hunger Vital Sign    Worried About Running Out of Food in the Last Year: Never true    Ran Out of Food in  the Last Year: Never true  Transportation Needs: No Transportation Needs (05/29/2023)   PRAPARE - Administrator, Civil Service (Medical): No    Lack of Transportation (Non-Medical): No  Physical Activity: Not on file  Stress: Not on file  Social Connections: Not on file    Tobacco Counseling Ready to quit: Not Answered Counseling given: Not Answered Tobacco comments: 5 a day    Clinical Intake:  Pre-visit preparation completed: Yes  Pain : No/denies pain     BMI - recorded: 25 Nutritional Status: BMI 25 -29 Overweight Nutritional Risks: Unintentional weight loss Diabetes: No  How often do you need to have someone help you when you read instructions, pamphlets, or other written materials from your doctor or pharmacy?: 1 - Never         Activities of Daily Living    05/29/2023    9:54 AM  In your present state of health, do you have any difficulty performing the following activities:  Hearing? 0  Vision? 0  Difficulty concentrating or making decisions? 0  Walking or climbing stairs? 0  Dressing or bathing? 0  Doing errands, shopping? 0  Preparing Food and eating ? N  Using the Toilet? N  In the past six months, have you accidently leaked urine? N  Do you have problems with loss of bowel control? N  Managing your Medications? N  Managing your Finances? N  Housekeeping or  managing your Housekeeping? N    Patient Care Team: Sharon Seller, NP as PCP - General (Geriatric Medicine)  Indicate any recent Medical Services you may have received from other than [facility] providers in the past year (date may be approximate).     Assessment:   This is a routine wellness examination for Ed[place].  Hearing/Vision screen Vision Screening - Comments:: Dr. Dione Booze Last Eye Exam: 2023   Goals Addressed   None    Depression Screen    05/29/2023    9:57 AM 04/14/2023    8:26 AM 05/21/2022   10:37 AM 05/15/2021    9:28 AM 06/16/2020   10:42 AM 12/02/2019   12:59 PM 05/31/2019    1:31 PM  PHQ 2/9 Scores  PHQ - 2 Score 0 0 0 0 0 0 0  PHQ- 9 Score  0         Fall Risk    05/29/2023    9:57 AM 04/14/2023    8:25 AM 10/14/2022    8:33 AM 05/21/2022   10:37 AM 04/15/2022   10:26 AM  Fall Risk   Falls in the past year? 0 0 0 0 0  Number falls in past yr: 0 0 0 0 0  Injury with Fall? 0 0 0 0 0  Risk for fall due to : No Fall Risks  No Fall Risks No Fall Risks No Fall Risks  Follow up Falls evaluation completed Falls evaluation completed Falls evaluation completed Falls evaluation completed Falls evaluation completed    MEDICARE RISK AT HOME: Medicare Risk at Home Any stairs in or around the home?: Yes If so, are there any without handrails?: No Home free of loose throw rugs in walkways, pet beds, electrical cords, etc?: Yes Adequate lighting in your home to reduce risk of falls?: Yes Life alert?: Yes Use of a cane, walker or w/c?: No Grab bars in the bathroom?: No Shower chair or bench in shower?: Yes Elevated toilet seat or a handicapped toilet?: Yes  TIMED UP AND GO:  Was  the test performed?  No    Cognitive Function:    02/06/2016    3:05 PM 11/30/2013    3:33 PM  MMSE - Mini Mental State Exam  Orientation to time 5 5  Orientation to Place 5 5  Registration 3 3  Attention/ Calculation 5 3  Recall 3 3  Language- name 2 objects 2 2  Language- repeat  1 1  Language- follow 3 step command 3 3  Language- read & follow direction 1 1  Write a sentence 1 1  Copy design 1 1  Total score 30 28        05/29/2023    9:58 AM 05/21/2022   10:39 AM 05/15/2021    9:34 AM 05/10/2019    2:45 PM  6CIT Screen  What Year? 0 points 0 points 0 points 0 points  What month? 0 points 0 points 0 points 0 points  What time? 0 points 0 points 0 points 0 points  Count back from 20 0 points 4 points 0 points 0 points  Months in reverse 0 points 0 points 0 points 0 points  Repeat phrase 0 points 0 points 0 points 0 points  Total Score 0 points 4 points 0 points 0 points    Immunizations Immunization History  Administered Date(s) Administered   Fluad Quad(high Dose 65+) 11/25/2018   Fluad Trivalent(High Dose 65+) 12/18/2022   Influenza, High Dose Seasonal PF 11/26/2016, 11/21/2017, 11/30/2019, 11/01/2021   Influenza-Unspecified 11/29/2009, 12/02/2012, 11/27/2013, 11/29/2014, 11/26/2015, 12/15/2020   Moderna Covid-19 Fall Seasonal Vaccine 81yrs & older 12/17/2021   PFIZER(Purple Top)SARS-COV-2 Vaccination 05/01/2019, 05/22/2019, 01/08/2020   Pfizer Covid-19 Vaccine Bivalent Booster 33yrs & up 12/17/2020   Pfizer(Comirnaty)Fall Seasonal Vaccine 12 years and older 12/18/2022   Pneumococcal Conjugate-13 04/16/2002, 11/04/2016   Pneumococcal Polysaccharide-23 05/07/2018   Rsv, Bivalent, Protein Subunit Rsvpref,pf Verdis Frederickson) 01/04/2022   Td 01/16/1993   Tdap 03/06/2011   Zoster, Live 11/25/2013    TDAP status: Due, Education has been provided regarding the importance of this vaccine. Advised may receive this vaccine at local pharmacy or Health Dept. Aware to provide a copy of the vaccination record if obtained from local pharmacy or Health Dept. Verbalized acceptance and understanding.  Flu Vaccine status: Up to date  Pneumococcal vaccine status: Up to date  Covid-19 vaccine status: Information provided on how to obtain vaccines.   Qualifies for Shingles  Vaccine? Yes   Zostavax completed No   Shingrix Completed?: No.    Education has been provided regarding the importance of this vaccine. Patient has been advised to call insurance company to determine out of pocket expense if they have not yet received this vaccine. Advised may also receive vaccine at local pharmacy or Health Dept. Verbalized acceptance and understanding.  Screening Tests Health Maintenance  Topic Date Due   Zoster Vaccines- Shingrix (1 of 2) 05/15/1992   DTaP/Tdap/Td (3 - Td or Tdap) 04/13/2024 (Originally 03/05/2021)   COVID-19 Vaccine (7 - 2024-25 season) 06/18/2023   INFLUENZA VACCINE  09/26/2023   Medicare Annual Wellness (AWV)  05/28/2024   Pneumonia Vaccine 57+ Years old  Completed   DEXA SCAN  Completed   HPV VACCINES  Aged Out   Hepatitis C Screening  Discontinued    Health Maintenance  Health Maintenance Due  Topic Date Due   Zoster Vaccines- Shingrix (1 of 2) 05/15/1992    Colorectal cancer screening: No longer required.   Mammogram status: No longer required due to age.  Bone Density status: Completed  10/25/2021. Results reflect: Bone density results: OSTEOPENIA. Repeat every 2 years.  Lung Cancer Screening: (Low Dose CT Chest recommended if Age 56-80 years, 20 pack-year currently smoking OR have quit w/in 15years.) does not qualify.   Lung Cancer Screening Referral: na  Additional Screening:  Hepatitis C Screening: does not qualify; Completed  Vision Screening: Recommended annual ophthalmology exams for early detection of glaucoma and other disorders of the eye. Is the patient up to date with their annual eye exam?  No  Who is the provider or what is the name of the office in which the patient attends annual eye exams? Dr Dione Booze If pt is not established with a provider, would they like to be referred to a provider to establish care? No .   Dental Screening: Recommended annual dental exams for proper oral hygiene  Community Resource Referral /  Chronic Care Management: CRR required this visit?  No   CCM required this visit?  No     Plan:     I have personally reviewed and noted the following in the patient's chart:   Medical and social history Use of alcohol, tobacco or illicit drugs  Current medications and supplements including opioid prescriptions. Patient is not currently taking opioid prescriptions. Functional ability and status Nutritional status Physical activity Advanced directives List of other physicians Hospitalizations, surgeries, and ER visits in previous 12 months Vitals Screenings to include cognitive, depression, and falls Referrals and appointments  In addition, I have reviewed and discussed with patient certain preventive protocols, quality metrics, and best practice recommendations. A written personalized care plan for preventive services as well as general preventive health recommendations were provided to patient.     Sharon Seller, NP   05/29/2023   After Visit Summary: (MyChart) Due to this being a telephonic visit, the after visit summary with patients personalized plan was offered to patient via MyChart

## 2023-05-29 NOTE — Progress Notes (Signed)
 This service is provided via telemedicine  No vital signs collected/recorded due to the encounter was a telemedicine visit.   Location of patient (ex: home, work):  Home  Patient consents to a telephone visit:  Yes  Location of the provider (ex: office, home):  Office [place].   Name of any referring provider:  na  Names of all persons participating in the telemedicine service and their role in the encounter:  Guadalupe Maple, patient, Nelda Severe, CMA, Abbey Chatters, NP  Time spent on call:  7:10

## 2023-06-23 ENCOUNTER — Other Ambulatory Visit: Payer: Self-pay | Admitting: Nurse Practitioner

## 2023-06-23 DIAGNOSIS — F3340 Major depressive disorder, recurrent, in remission, unspecified: Secondary | ICD-10-CM

## 2023-06-24 NOTE — Telephone Encounter (Signed)
 Patient has request refill on medication Clorazepate  3.75mg . Patient medication last refilled 04/14/2023. Patient has Non opioid contract on file dated 10/12/2021. Patient has upcoming appointment in August. Update contract added to appointment note. Medication pend and sent to PCP Roselie Conger Jessica K, NP for approval.

## 2023-07-23 ENCOUNTER — Other Ambulatory Visit: Payer: Self-pay | Admitting: Nurse Practitioner

## 2023-07-23 DIAGNOSIS — F418 Other specified anxiety disorders: Secondary | ICD-10-CM

## 2023-07-25 ENCOUNTER — Other Ambulatory Visit: Payer: Self-pay | Admitting: Nurse Practitioner

## 2023-07-25 DIAGNOSIS — I10 Essential (primary) hypertension: Secondary | ICD-10-CM

## 2023-08-27 ENCOUNTER — Other Ambulatory Visit: Payer: Self-pay | Admitting: Nurse Practitioner

## 2023-08-27 DIAGNOSIS — F3340 Major depressive disorder, recurrent, in remission, unspecified: Secondary | ICD-10-CM

## 2023-08-27 NOTE — Telephone Encounter (Signed)
 Patient has request for refill on Clorazepate . Patient last refill was 06/24/2023. Patient has contract on file dated 10/12/2021. Patient has upcoming appointment 10/17/2023. Updated contract/Sign contract was added to appointment notes. Medication pend and sent to PCP Arch, Jessica K, NP) for approval.

## 2023-10-17 ENCOUNTER — Encounter: Payer: Self-pay | Admitting: Nurse Practitioner

## 2023-10-17 ENCOUNTER — Ambulatory Visit: Payer: Medicare HMO | Admitting: Nurse Practitioner

## 2023-10-17 VITALS — BP 138/80 | HR 60 | Temp 98.0°F | Ht 61.0 in | Wt 138.4 lb

## 2023-10-17 DIAGNOSIS — I10 Essential (primary) hypertension: Secondary | ICD-10-CM

## 2023-10-17 DIAGNOSIS — F411 Generalized anxiety disorder: Secondary | ICD-10-CM

## 2023-10-17 DIAGNOSIS — F3341 Major depressive disorder, recurrent, in partial remission: Secondary | ICD-10-CM | POA: Diagnosis not present

## 2023-10-17 DIAGNOSIS — K5901 Slow transit constipation: Secondary | ICD-10-CM

## 2023-10-17 DIAGNOSIS — E785 Hyperlipidemia, unspecified: Secondary | ICD-10-CM

## 2023-10-17 DIAGNOSIS — M8589 Other specified disorders of bone density and structure, multiple sites: Secondary | ICD-10-CM

## 2023-10-17 DIAGNOSIS — R600 Localized edema: Secondary | ICD-10-CM

## 2023-10-17 LAB — CBC WITH DIFFERENTIAL/PLATELET
Absolute Lymphocytes: 1476 {cells}/uL (ref 850–3900)
Absolute Monocytes: 388 {cells}/uL (ref 200–950)
Basophils Absolute: 41 {cells}/uL (ref 0–200)
Basophils Relative: 1.2 %
Eosinophils Absolute: 160 {cells}/uL (ref 15–500)
Eosinophils Relative: 4.7 %
HCT: 36.3 % (ref 35.0–45.0)
Hemoglobin: 11.9 g/dL (ref 11.7–15.5)
MCH: 31.5 pg (ref 27.0–33.0)
MCHC: 32.8 g/dL (ref 32.0–36.0)
MCV: 96 fL (ref 80.0–100.0)
MPV: 10.7 fL (ref 7.5–12.5)
Monocytes Relative: 11.4 %
Neutro Abs: 1336 {cells}/uL — ABNORMAL LOW (ref 1500–7800)
Neutrophils Relative %: 39.3 %
Platelets: 185 Thousand/uL (ref 140–400)
RBC: 3.78 Million/uL — ABNORMAL LOW (ref 3.80–5.10)
RDW: 11.8 % (ref 11.0–15.0)
Total Lymphocyte: 43.4 %
WBC: 3.4 Thousand/uL — ABNORMAL LOW (ref 3.8–10.8)

## 2023-10-17 LAB — COMPREHENSIVE METABOLIC PANEL WITH GFR
AG Ratio: 1.5 (calc) (ref 1.0–2.5)
ALT: 11 U/L (ref 6–29)
AST: 14 U/L (ref 10–35)
Albumin: 3.8 g/dL (ref 3.6–5.1)
Alkaline phosphatase (APISO): 60 U/L (ref 37–153)
BUN: 7 mg/dL (ref 7–25)
CO2: 31 mmol/L (ref 20–32)
Calcium: 9.2 mg/dL (ref 8.6–10.4)
Chloride: 99 mmol/L (ref 98–110)
Creat: 0.71 mg/dL (ref 0.60–0.95)
Globulin: 2.6 g/dL (ref 1.9–3.7)
Glucose, Bld: 89 mg/dL (ref 65–99)
Potassium: 4.6 mmol/L (ref 3.5–5.3)
Sodium: 136 mmol/L (ref 135–146)
Total Bilirubin: 0.5 mg/dL (ref 0.2–1.2)
Total Protein: 6.4 g/dL (ref 6.1–8.1)
eGFR: 85 mL/min/1.73m2 (ref >=60)

## 2023-10-17 LAB — LIPID PANEL
Cholesterol: 138 mg/dL (ref <200)
HDL: 62 mg/dL (ref >=50)
LDL Cholesterol (Calc): 63 mg/dL
Non-HDL Cholesterol (Calc): 76 mg/dL (ref <130)
Total CHOL/HDL Ratio: 2.2 (calc) (ref <5.0)
Triglycerides: 44 mg/dL (ref <150)

## 2023-10-17 MED ORDER — CLORAZEPATE DIPOTASSIUM 3.75 MG PO TABS
3.7500 mg | ORAL_TABLET | Freq: Every day | ORAL | 0 refills | Status: DC
Start: 2023-10-17 — End: 2023-10-17

## 2023-10-17 MED ORDER — BUSPIRONE HCL 5 MG PO TABS: 5.0000 mg | ORAL_TABLET | Freq: Two times a day (BID) | ORAL | 1 refills | Status: DC

## 2023-10-17 NOTE — Progress Notes (Signed)
 Careteam: Patient Care Team: [name], NP as PCP - General (Geriatric Medicine)  PLACE OF SERVICE:  [facility]  Advanced Directive information    Allergies  Allergen Reactions   Caffeine Other (See Comments)    Rapid heart rate    Demerol [Meperidine]    Venlafaxine Hcl Other (See Comments)    Hyper   Pseudoephedrine Palpitations    Chief Complaint  Patient presents with   Follow-up    6 month follow up, update contract. Discussed need for shingrix (patient thinks she had) and covid boosters     HPI:  Discussed the use of AI scribe software for clinical note transcription with the patient, who gave verbal consent to proceed.  History of Present Illness Felicia Sawyer is an 83 year old female who presents for a six-month follow-up visit.  She is currently taking Tranxene  for anxiety, with the dose reduced from 7.5 mg to 3.7 mg. She recalls being on a different medication years ago, which was changed. No increase in anxiety or depression has been noted recently, although she sometimes feels she makes herself depressed.  Her lifestyle is described as not very active, mainly involving trips to the grocery store, church, and family gatherings. She has a history of raising her grandson, who is now 15 years old, and has been very involved in his life, which led her to stop drinking alcohol. She has a great-granddaughter who is 47 years old and is described as very smart.  No issues with constipation are reported  she continues to drive.  In 2022, she had a hernia that was blocked but resolved without surgery. No new symptoms or issues have arisen since then.    Review of Systems:  Review of Systems  Constitutional:  Negative for chills, fever and weight loss.  HENT:  Negative for tinnitus.   Respiratory:  Negative for cough, sputum production and shortness of breath.   Cardiovascular:  Negative for chest pain, palpitations and leg swelling.   Gastrointestinal:  Negative for abdominal pain, constipation, diarrhea and heartburn.  Genitourinary:  Negative for dysuria, frequency and urgency.  Musculoskeletal:  Negative for back pain, falls, joint pain and myalgias.  Skin: Negative.   Neurological:  Negative for dizziness and headaches.  Psychiatric/Behavioral:  Negative for depression and memory loss. The patient does not have insomnia.     Past Medical History:  Diagnosis Date   Abnormality of gait 07/11/2005   Allergy    Anxiety state, unspecified 04/08/2012   Asymptomatic varicose veins 09/24/2011   Cataract    bilateral   Chronic venous insufficiency 09/16/2012   DDD (degenerative disc disease), cervical    Degenerative and vascular disorders of ear, unspecified 08/25/2002   Depressive disorder, not elsewhere classified 11/10/2002   Diverticulosis of colon (without mention of hemorrhage) 08/25/1989   Edema 09/24/2011   Esophageal reflux 11/10/2002   pt denies reflux   First degree atrioventricular block 07/19/2008   Heart murmur    Hypertension    Inguinal hernia without mention of obstruction or gangrene, unilateral or unspecified, (not specified as recurrent) 11/28/2006   Insomnia, unspecified 12/15/2007   Leiomyoma of uterus, unspecified 05/27/2006   Leukocytopenia, unspecified 08/28/2010   Lumbago 07/22/2007   Obesity, unspecified 05/26/1996   Osteoarthrosis, unspecified whether generalized or localized, unspecified site 05/26/2004   Other abnormal blood chemistry 07/19/2008   Other and unspecified hyperlipidemia 10/24/2004   Palpitations 08/29/2007   Peptic ulcer, unspecified site, unspecified as acute or chronic, without  mention of hemorrhage, perforation, or obstruction 05/27/2006   Scoliosis (and kyphoscoliosis), idiopathic 05/23/2009   Symptomatic menopausal or female climacteric states 05/28/1990   Unspecified essential hypertension 11/13/2004   Past Surgical History:  Procedure Laterality Date    CATARACT EXTRACTION  2022   CHOLECYSTECTOMY  1971   ENDOMETRIAL BIOPSY  03/1992   Dr. Sharron   FOOT SURGERY Left 04/2003   [facility]- Dr. Minus FONTANA HERNIA REPAIR Right 03/2007   Dr. Belinda   KNEE ARTHROSCOPY Left 07/1997   Dr. Willy   KNEE SURGERY Right 03/02/2004   Dr. Yvone   OOPHORECTOMY Right 1974   Dr. Janit   THUMB ARTHROSCOPY Right 1995   Dr.Sypher   TONSILLECTOMY AND ADENOIDECTOMY  1975   TUBAL LIGATION     VESICOVAGINAL FISTULA CLOSURE W/ TAH  1975   Social History:   reports that she has been smoking cigarettes. She has a 15 pack-year smoking history. She has never used smokeless tobacco. She reports that she does not drink alcohol and does not use drugs.  Family History  Problem Relation Age of Onset   Kidney disease Mother    Hypertension Mother    Heart disease Father    Diabetes Father    Hypertension Sister    Atrial fibrillation Daughter    CVA Daughter    Drug abuse Son    Osteoporosis Daughter    Hypertension Daughter    Colon cancer Neg Hx    Colon polyps Neg Hx    Rectal cancer Neg Hx    Stomach cancer Neg Hx    Esophageal cancer Neg Hx     Medications: Patient's Medications  New Prescriptions   No medications on file  Previous Medications   ASCORBIC ACID (VITAMIN C PO)    500 mg. 1/2 tablet twice a day   ASPIRIN  81 MG TABLET    Take 81 mg by mouth at bedtime.   CALCIUM CARBONATE (CALCIUM 600 PO)    Take 1 tablet by mouth 2 (two) times daily.   CHOLECALCIFEROL (VITAMIN D3) 50 MCG (2000 UT) CAPSULE    Take 2,000 Units by mouth daily.   CLONIDINE  (CATAPRES ) 0.1 MG TABLET    TAKE 1 TABLET BY MOUTH TWICE DAILY FOR BLOOD PRESSURE   CLORAZEPATE  (TRANXENE ) 3.75 MG TABLET    TAKE 1 TABLET(3.75 MG) BY MOUTH TWICE DAILY   DOCUSATE SODIUM  (COLACE) 100 MG CAPSULE    Take 100 mg by mouth daily.   FEXOFENADINE (ALLEGRA) 180 MG TABLET    Take 180 mg by mouth daily as needed for allergies.   FUROSEMIDE  (LASIX ) 20 MG TABLET    TAKE 1 TABLET(20  MG) BY MOUTH EVERY OTHER DAY   LOSARTAN  (COZAAR ) 100 MG TABLET    TAKE 1 TABLET BY MOUTH EVERY DAY FOR BLOOD PRESSURE   SENNOSIDES-DOCUSATE SODIUM  (SENOKOT-S) 8.6-50 MG TABLET    Take 1 tablet by mouth 2 (two) times daily.   SIMVASTATIN  (ZOCOR ) 40 MG TABLET    TAKE 1 TABLET BY MOUTH EVERY DAY FOR CHOLESTEROL  Modified Medications   No medications on file  Discontinued Medications   No medications on file    Physical Exam:  Vitals:   10/17/23 1044  BP: 138/80  Pulse: 60  Temp: 98 F (36.7 C)  SpO2: 95%  Weight: 138 lb 6.4 oz (62.8 kg)  Height: 5' 1 (1.549 m)   Body mass index is 26.15 kg/m. Wt Readings from Last 3 Encounters:  10/17/23 138 lb 6.4  oz (62.8 kg)  04/14/23 133 lb 9.6 oz (60.6 kg)  10/14/22 136 lb 12.8 oz (62.1 kg)    Physical Exam Constitutional:      General: She is not in acute distress.    Appearance: She is well-developed. She is not diaphoretic.  HENT:     Head: Normocephalic and atraumatic.     Mouth/Throat:     Pharynx: No oropharyngeal exudate.  Eyes:     Conjunctiva/sclera: Conjunctivae normal.     Pupils: Pupils are equal, round, and reactive to light.  Cardiovascular:     Rate and Rhythm: Normal rate and regular rhythm.     Heart sounds: Normal heart sounds.  Pulmonary:     Effort: Pulmonary effort is normal.     Breath sounds: Normal breath sounds.  Abdominal:     General: Bowel sounds are normal.     Palpations: Abdomen is soft.  Musculoskeletal:     Cervical back: Normal range of motion and neck supple.     Right lower leg: No edema.     Left lower leg: No edema.  Skin:    General: Skin is warm and dry.  Neurological:     Mental Status: She is alert.  Psychiatric:        Mood and Affect: Mood normal.     Labs reviewed: Basic Metabolic Panel: Recent Labs    04/16/23 0918  NA 133*  K 4.2  CL 97*  CO2 30  GLUCOSE 103*  BUN 10  CREATININE 0.80  CALCIUM 9.3   Liver Function Tests: Recent Labs    04/16/23 0918  AST  13  ALT 7  BILITOT 0.4  PROT 6.7   No results for input(s): LIPASE, AMYLASE in the last 8760 hours. No results for input(s): AMMONIA in the last 8760 hours. CBC: Recent Labs    04/16/23 0918  WBC 4.2  NEUTROABS 2,541  HGB 11.9  HCT 37.4  MCV 94.4  PLT 212   Lipid Panel: No results for input(s): CHOL, HDL, LDLCALC, TRIG, CHOLHDL, LDLDIRECT in the last 8760 hours. TSH: No results for input(s): TSH in the last 8760 hours. A1C: Lab Results  Component Value Date   HGBA1C 5.3 12/02/2019     Assessment/Plan  Essential hypertension Assessment & Plan: Blood pressure well controlled, goal bp <140/90 Continue current medications and dietary modifications follow metabolic panel  Orders: -     CBC with Differential/Platelet -     Comprehensive metabolic panel with GFR  Hyperlipidemia, unspecified hyperlipidemia type Assessment & Plan: Continue zocor  40 mg by mouth daily Follow lipids  Orders: -     Lipid panel -     Comprehensive metabolic panel with GFR  Recurrent major depressive disorder, in partial remission (HCC) Assessment & Plan: Ongoing and stable, continue lifestyle modifications. Will stop tranxene  at this time.   Orders: -     busPIRone  HCl; Take 1 tablet (5 mg total) by mouth 2 (two) times daily.  Dispense: 60 tablet; Refill: 1  Anxiety state Assessment & Plan: Ongoing and stable, will titrate off tranxene  at this time. She is agreeable- will start buspar  5 mg BID at this time.   Orders: -     busPIRone  HCl; Take 1 tablet (5 mg total) by mouth 2 (two) times daily.  Dispense: 60 tablet; Refill: 1  Slow transit constipation Assessment & Plan: Stable, continue senokot-s BID with dietary modifications.    Osteopenia of multiple sites Assessment & Plan: contiue to take calcium 600  mg twice daily with Vitamin D 2000 units daily and weight bearing activity 30 mins/5 days a week    Edema of both lower extremities Assessment &  Plan: -encouraged to elevate legs above level of heart as tolerates, low sodium diet, compression hose as tolerates (on in am, off in pm)     Return in about 6 weeks (around 11/28/2023) for mood.  Abdelrahman Nair K. Caro BODILY [facility] [phone]

## 2023-10-17 NOTE — Assessment & Plan Note (Signed)
 Ongoing and stable, continue lifestyle modifications. Will stop tranxene  at this time.

## 2023-10-17 NOTE — Assessment & Plan Note (Signed)
 Blood pressure well controlled, goal bp <140/90 Continue current medications and dietary modifications follow metabolic panel

## 2023-10-17 NOTE — Assessment & Plan Note (Signed)
 contiue to take calcium 600 mg twice daily with Vitamin D 2000 units daily and weight bearing activity 30 mins/5 days a week

## 2023-10-17 NOTE — Assessment & Plan Note (Signed)
 Ongoing and stable, will titrate off tranxene  at this time. She is agreeable- will start buspar  5 mg BID at this time.

## 2023-10-17 NOTE — Patient Instructions (Addendum)
 1.) Visit your local pharmacy to receive your covid vaccine.  2) Check home records to locate your shingles vaccine documentation and submit via mychart or at your next visit. You can also contact your insurance company to find out what dates a claim was filed for shingrix.   Decrease tranxene  to once daily for 2 weeks Then decrease tranxene  to every other day for 2 weeks Then decrease to every 3rd day for 2 weeks  Then stop  Start buspar  5 mg by mouth twice daily

## 2023-10-17 NOTE — Assessment & Plan Note (Signed)
-  encouraged to elevate legs above level of heart as tolerates, low sodium diet, compression hose as tolerates (on in am, off in pm)

## 2023-10-17 NOTE — Assessment & Plan Note (Signed)
 Continue zocor  40 mg by mouth daily Follow lipids

## 2023-10-17 NOTE — Assessment & Plan Note (Signed)
 Stable, continue senokot-s BID with dietary modifications.

## 2023-10-18 ENCOUNTER — Ambulatory Visit: Payer: Self-pay | Admitting: Nurse Practitioner

## 2023-10-20 ENCOUNTER — Other Ambulatory Visit: Payer: Self-pay | Admitting: Nurse Practitioner

## 2023-10-27 ENCOUNTER — Other Ambulatory Visit: Payer: Self-pay | Admitting: Nurse Practitioner

## 2023-10-27 DIAGNOSIS — R600 Localized edema: Secondary | ICD-10-CM

## 2023-10-27 DIAGNOSIS — N179 Acute kidney failure, unspecified: Secondary | ICD-10-CM

## 2023-10-27 DIAGNOSIS — I10 Essential (primary) hypertension: Secondary | ICD-10-CM

## 2023-11-24 ENCOUNTER — Ambulatory Visit: Admitting: Nurse Practitioner

## 2023-11-24 ENCOUNTER — Telehealth: Payer: Self-pay | Admitting: *Deleted

## 2023-11-24 ENCOUNTER — Telehealth (INDEPENDENT_AMBULATORY_CARE_PROVIDER_SITE_OTHER): Admitting: Nurse Practitioner

## 2023-11-24 ENCOUNTER — Encounter: Payer: Self-pay | Admitting: Nurse Practitioner

## 2023-11-24 DIAGNOSIS — F411 Generalized anxiety disorder: Secondary | ICD-10-CM

## 2023-11-24 NOTE — Telephone Encounter (Signed)
 [name], maves are scheduled for a virtual visit with your provider today.    Just as we do with appointments in the office, we must obtain your consent to participate.  Your consent will be active for this visit and any virtual visit you Felicia Sawyer have with one of our providers in the next 365 days.    If you have a MyChart account, I can also send a copy of this consent to you electronically.  All virtual visits are billed to your insurance company just like a traditional visit in the office.  As this is a virtual visit, video technology does not allow for your provider to perform a traditional examination.  This Felicia Sawyer limit your provider's ability to fully assess your condition.  If your provider identifies any concerns that need to be evaluated in person or the need to arrange testing such as labs, EKG, etc, we will make arrangements to do so.    Although advances in technology are sophisticated, we cannot ensure that it will always work on either your end or our end.  If the connection with a video visit is poor, we Felicia Sawyer have to switch to a telephone visit.  With either a video or telephone visit, we are not always able to ensure that we have a secure connection.   I need to obtain your verbal consent now.   Are you willing to proceed with your visit today?   Felicia Sawyer has provided verbal consent on 11/24/2023 for a virtual visit (video or telephone).   Felicia Sawyer, CMA 11/24/2023  8:56 AM

## 2023-11-24 NOTE — Progress Notes (Signed)
 Careteam: Patient Care Team: [name], NP as PCP - General (Geriatric Medicine)  Advanced Directive information Does Patient Have a Medical Advance Directive?: Yes, Type of Advance Directive: Living will, Does patient want to make changes to medical advance directive?: No - Patient declined  Allergies  Allergen Reactions   Caffeine Other (See Comments)    Rapid heart rate    Demerol [Meperidine]    Venlafaxine Hcl Other (See Comments)    Hyper   Pseudoephedrine Palpitations    Chief Complaint  Patient presents with   Follow-up    6 week follow up for mood      HPI: Patient is a 83 y.o. female via video visit for mood Discussed the use of AI scribe software for clinical note transcription with the patient, who gave verbal consent to proceed.  History of Present Illness Felicia Sawyer is an 83 year old female who presents for a six-week follow-up regarding her mood and anxiety management.  She is currently taking Tranxene  3.75 mg every third day, with her anxiety being well-managed on this regimen. She experiences some jitteriness in the mornings but feels fine for the rest of the day.  Plan was to start buspar  and stop tranxene . She has not yet started Buspar , as she had a sufficient supply of Tranxene . She has picked up the Buspar  from the pharmacy.  She mentions a family history of nervousness, attributing some of her anxiety to her mother's experiences during pregnancy, believing she inherited this trait from her mother.  Socially, she had plans to attend a wedding, which was postponed due to the couple breaking up. She often reflects on her late husband, noting that memories of him are frequent and bring good thoughts. She has a supportive family, with her children often reminiscing about their father.     Review of Systems:  Review of Systems  Constitutional:  Negative for chills, fever and malaise/fatigue.  Psychiatric/Behavioral:  The  patient is nervous/anxious.    Past Medical History:  Diagnosis Date   Abnormality of gait 07/11/2005   Allergy    Anxiety state, unspecified 04/08/2012   Asymptomatic varicose veins 09/24/2011   Cataract    bilateral   Chronic venous insufficiency 09/16/2012   DDD (degenerative disc disease), cervical    Degenerative and vascular disorders of ear, unspecified 08/25/2002   Depressive disorder, not elsewhere classified 11/10/2002   Diverticulosis of colon (without mention of hemorrhage) 08/25/1989   Edema 09/24/2011   Esophageal reflux 11/10/2002   pt denies reflux   First degree atrioventricular block 07/19/2008   Heart murmur    Hypertension    Inguinal hernia without mention of obstruction or gangrene, unilateral or unspecified, (not specified as recurrent) 11/28/2006   Insomnia, unspecified 12/15/2007   Leiomyoma of uterus, unspecified 05/27/2006   Leukocytopenia, unspecified 08/28/2010   Lumbago 07/22/2007   Obesity, unspecified 05/26/1996   Osteoarthrosis, unspecified whether generalized or localized, unspecified site 05/26/2004   Other abnormal blood chemistry 07/19/2008   Other and unspecified hyperlipidemia 10/24/2004   Palpitations 08/29/2007   Peptic ulcer, unspecified site, unspecified as acute or chronic, without mention of hemorrhage, perforation, or obstruction 05/27/2006   Scoliosis (and kyphoscoliosis), idiopathic 05/23/2009   Symptomatic menopausal or female climacteric states 05/28/1990   Unspecified essential hypertension 11/13/2004   Past Surgical History:  Procedure Laterality Date   CATARACT EXTRACTION  2022   CHOLECYSTECTOMY  1971   ENDOMETRIAL BIOPSY  03/1992   Dr. Sharron   FOOT SURGERY Left  04/2003   [facility]- Dr. Minus FONTANA HERNIA REPAIR Right 03/2007   Dr. Belinda   KNEE ARTHROSCOPY Left 07/1997   Dr. Willy   KNEE SURGERY Right 03/02/2004   Dr. Yvone   OOPHORECTOMY Right 1974   Dr. Janit   THUMB ARTHROSCOPY Right 1995    Dr.Sypher   TONSILLECTOMY AND ADENOIDECTOMY  1975   TUBAL LIGATION     VESICOVAGINAL FISTULA CLOSURE W/ TAH  1975   Social History:   reports that she has been smoking cigarettes. She has a 15 pack-year smoking history. She has never used smokeless tobacco. She reports that she does not drink alcohol and does not use drugs.  Family History  Problem Relation Age of Onset   Kidney disease Mother    Hypertension Mother    Heart disease Father    Diabetes Father    Hypertension Sister    Atrial fibrillation Daughter    CVA Daughter    Drug abuse Son    Osteoporosis Daughter    Hypertension Daughter    Colon cancer Neg Hx    Colon polyps Neg Hx    Rectal cancer Neg Hx    Stomach cancer Neg Hx    Esophageal cancer Neg Hx     Medications: Patient's Medications  New Prescriptions   No medications on file  Previous Medications   ASCORBIC ACID (VITAMIN C PO)    500 mg. 1/2 tablet twice a day   ASPIRIN  81 MG TABLET    Take 81 mg by mouth at bedtime.   BUSPIRONE  (BUSPAR ) 5 MG TABLET    Take 1 tablet (5 mg total) by mouth 2 (two) times daily.   CALCIUM CARBONATE (CALCIUM 600 PO)    Take 1 tablet by mouth 2 (two) times daily.   CHOLECALCIFEROL (VITAMIN D3) 50 MCG (2000 UT) CAPSULE    Take 2,000 Units by mouth daily.   CLONIDINE  (CATAPRES ) 0.1 MG TABLET    TAKE 1 TABLET BY MOUTH TWICE DAILY FOR BLOOD PRESSURE   CLORAZEPATE  (TRANXENE ) 3.75 MG TABLET    Take 3.75 mg by mouth daily.   DOCUSATE SODIUM  (COLACE) 100 MG CAPSULE    Take 100 mg by mouth daily.   FEXOFENADINE (ALLEGRA) 180 MG TABLET    Take 180 mg by mouth daily as needed for allergies.   FUROSEMIDE  (LASIX ) 20 MG TABLET    TAKE 1 TABLET(20 MG) BY MOUTH EVERY OTHER DAY   LOSARTAN  (COZAAR ) 100 MG TABLET    TAKE 1 TABLET BY MOUTH EVERY DAY FOR BLOOD PRESSURE   SENNOSIDES-DOCUSATE SODIUM  (SENOKOT-S) 8.6-50 MG TABLET    Take 1 tablet by mouth 2 (two) times daily.   SIMVASTATIN  (ZOCOR ) 40 MG TABLET    TAKE 1 TABLET BY MOUTH EVERY DAY  FOR CHOLESTEROL  Modified Medications   No medications on file  Discontinued Medications   No medications on file    Physical Exam:  There were no vitals filed for this visit. There is no height or weight on file to calculate BMI. Wt Readings from Last 3 Encounters:  10/17/23 138 lb 6.4 oz (62.8 kg)  04/14/23 133 lb 9.6 oz (60.6 kg)  10/14/22 136 lb 12.8 oz (62.1 kg)    Physical Exam Constitutional:      Appearance: Normal appearance.  Pulmonary:     Effort: Pulmonary effort is normal.  Neurological:     Mental Status: She is alert. Mental status is at baseline.  Psychiatric:  Mood and Affect: Mood normal.     Labs reviewed: Basic Metabolic Panel: Recent Labs    04/16/23 0918 10/17/23 1110  NA 133* 136  K 4.2 4.6  CL 97* 99  CO2 30 31  GLUCOSE 103* 89  BUN 10 7  CREATININE 0.80 0.71  CALCIUM 9.3 9.2   Liver Function Tests: Recent Labs    04/16/23 0918 10/17/23 1110  AST 13 14  ALT 7 11  BILITOT 0.4 0.5  PROT 6.7 6.4   No results for input(s): LIPASE, AMYLASE in the last 8760 hours. No results for input(s): AMMONIA in the last 8760 hours. CBC: Recent Labs    04/16/23 0918 10/17/23 1110  WBC 4.2 3.4*  NEUTROABS 2,541 1,336*  HGB 11.9 11.9  HCT 37.4 36.3  MCV 94.4 96.0  PLT 212 185   Lipid Panel: Recent Labs    10/17/23 1110  CHOL 138  HDL 62  LDLCALC 63  TRIG 44  CHOLHDL 2.2   TSH: No results for input(s): TSH in the last 8760 hours. A1C: Lab Results  Component Value Date   HGBA1C 5.3 12/02/2019     Assessment/Plan Assessment and Plan Assessment & Plan Anxiety disorder Transition to Buspar  planned due to medication availability issues and her readiness to discontinue Tranxene . - Discontinue Tranxene . - Start Buspar  5 mg 1 tablet in the morning and 1 in the evening. - Schedule follow-up in 3 months to assess Buspar  response and for routine follow up    Next appt: 02/23/2024 for routine  Sara Keys K. Caro BODILY  [facility] [phone]    Virtual Visit via video  I connected with patient on 11/24/23 at  9:00 AM EDT by mychart and verified that I am speaking with the correct person using two identifiers.  Location: Patient: home Provider: office   I discussed the limitations, risks, security and privacy concerns of performing an evaluation and management service by telephone and the availability of in person appointments. I also discussed with the patient that there may be a patient responsible charge related to this service. The patient expressed understanding and agreed to proceed.   I discussed the assessment and treatment plan with the patient. The patient was provided an opportunity to ask questions and all were answered. The patient agreed with the plan and demonstrated an understanding of the instructions.   The patient was advised to call back or seek an in-person evaluation if the symptoms worsen or if the condition fails to improve as anticipated.  I provided 15 minutes of non-face-to-face time during this encounter.  Catharina Pica K. Caro BODILY Avs printed and mailed

## 2023-11-24 NOTE — Progress Notes (Signed)
 This service is provided via telemedicine  No vital signs collected/recorded due to the encounter was a telemedicine visit.   Location of patient (ex: home, work):  Home  Patient consents to a telephone visit:  Yes  Location of the provider (ex: office, home):  Office [facility]  Name of any referring provider:  na  Names of all persons participating in the telemedicine service and their role in the encounter:  Othel Lento, Patient, Donzell Beal, CMA, Harlene An, NP  Time spent on call:  7:10

## 2024-01-08 ENCOUNTER — Other Ambulatory Visit: Payer: Self-pay | Admitting: Nurse Practitioner

## 2024-01-08 DIAGNOSIS — F411 Generalized anxiety disorder: Secondary | ICD-10-CM

## 2024-01-08 DIAGNOSIS — F3341 Major depressive disorder, recurrent, in partial remission: Secondary | ICD-10-CM

## 2024-01-16 ENCOUNTER — Other Ambulatory Visit: Payer: Self-pay | Admitting: Nurse Practitioner

## 2024-01-16 DIAGNOSIS — I10 Essential (primary) hypertension: Secondary | ICD-10-CM

## 2024-02-18 ENCOUNTER — Other Ambulatory Visit: Payer: Self-pay | Admitting: Nurse Practitioner

## 2024-02-18 DIAGNOSIS — I10 Essential (primary) hypertension: Secondary | ICD-10-CM

## 2024-02-23 ENCOUNTER — Encounter: Payer: Self-pay | Admitting: Nurse Practitioner

## 2024-02-23 NOTE — Progress Notes (Signed)
 This encounter was created in error - please disregard.

## 2024-03-01 ENCOUNTER — Ambulatory Visit: Admitting: Nurse Practitioner

## 2024-03-01 ENCOUNTER — Encounter: Payer: Self-pay | Admitting: Nurse Practitioner

## 2024-03-01 VITALS — BP 126/78 | HR 87 | Temp 96.9°F | Resp 18 | Ht 61.0 in | Wt 141.0 lb

## 2024-03-01 DIAGNOSIS — E785 Hyperlipidemia, unspecified: Secondary | ICD-10-CM | POA: Diagnosis not present

## 2024-03-01 DIAGNOSIS — R600 Localized edema: Secondary | ICD-10-CM

## 2024-03-01 DIAGNOSIS — K5901 Slow transit constipation: Secondary | ICD-10-CM | POA: Diagnosis not present

## 2024-03-01 DIAGNOSIS — F411 Generalized anxiety disorder: Secondary | ICD-10-CM

## 2024-03-01 DIAGNOSIS — I10 Essential (primary) hypertension: Secondary | ICD-10-CM

## 2024-03-01 DIAGNOSIS — M8589 Other specified disorders of bone density and structure, multiple sites: Secondary | ICD-10-CM

## 2024-03-01 MED ORDER — BUSPIRONE HCL 10 MG PO TABS: 10.0000 mg | ORAL_TABLET | Freq: Two times a day (BID) | ORAL | 3 refills | Status: AC

## 2024-03-01 NOTE — Patient Instructions (Signed)
 Increase buspar  to 10 mg by mouth twice daily
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
## 2024-03-02 ENCOUNTER — Ambulatory Visit: Payer: Self-pay | Admitting: Nurse Practitioner

## 2024-03-02 LAB — COMPREHENSIVE METABOLIC PANEL WITH GFR
AG Ratio: 1.2 (calc) (ref 1.0–2.5)
ALT: 8 U/L (ref 6–29)
AST: 13 U/L (ref 10–35)
Albumin: 3.6 g/dL (ref 3.6–5.1)
Alkaline phosphatase (APISO): 65 U/L (ref 37–153)
BUN/Creatinine Ratio: 9 (calc) (ref 6–22)
BUN: 6 mg/dL — ABNORMAL LOW (ref 7–25)
CO2: 29 mmol/L (ref 20–32)
Calcium: 9 mg/dL (ref 8.6–10.4)
Chloride: 99 mmol/L (ref 98–110)
Creat: 0.7 mg/dL (ref 0.60–0.95)
Globulin: 2.9 g/dL (ref 1.9–3.7)
Glucose, Bld: 79 mg/dL (ref 65–139)
Potassium: 3.9 mmol/L (ref 3.5–5.3)
Sodium: 136 mmol/L (ref 135–146)
Total Bilirubin: 0.5 mg/dL (ref 0.2–1.2)
Total Protein: 6.5 g/dL (ref 6.1–8.1)
eGFR: 87 mL/min/1.73m2

## 2024-03-02 LAB — CBC WITH DIFFERENTIAL/PLATELET
Absolute Lymphocytes: 1377 {cells}/uL (ref 850–3900)
Absolute Monocytes: 410 {cells}/uL (ref 200–950)
Basophils Absolute: 39 {cells}/uL (ref 0–200)
Basophils Relative: 1 %
Eosinophils Absolute: 179 {cells}/uL (ref 15–500)
Eosinophils Relative: 4.6 %
HCT: 36.7 % (ref 35.9–46.0)
Hemoglobin: 11.7 g/dL (ref 11.7–15.5)
MCH: 30.4 pg (ref 27.0–33.0)
MCHC: 31.9 g/dL (ref 31.6–35.4)
MCV: 95.3 fL (ref 81.4–101.7)
MPV: 11.2 fL (ref 7.5–12.5)
Monocytes Relative: 10.5 %
Neutro Abs: 1895 {cells}/uL (ref 1500–7800)
Neutrophils Relative %: 48.6 %
Platelets: 192 Thousand/uL (ref 140–400)
RBC: 3.85 Million/uL (ref 3.80–5.10)
RDW: 11.8 % (ref 11.0–15.0)
Total Lymphocyte: 35.3 %
WBC: 3.9 Thousand/uL (ref 3.8–10.8)

## 2024-03-05 ENCOUNTER — Other Ambulatory Visit: Payer: Self-pay | Admitting: Nurse Practitioner

## 2024-03-05 DIAGNOSIS — F3341 Major depressive disorder, recurrent, in partial remission: Secondary | ICD-10-CM

## 2024-03-05 DIAGNOSIS — F411 Generalized anxiety disorder: Secondary | ICD-10-CM

## 2024-05-31 ENCOUNTER — Ambulatory Visit: Admitting: Nurse Practitioner

## 2024-06-01 ENCOUNTER — Ambulatory Visit: Payer: Self-pay | Admitting: Nurse Practitioner
